# Patient Record
Sex: Female | Born: 1965 | Race: Black or African American | Hispanic: No | Marital: Married | Smoking: Never smoker
Health system: Southern US, Community
[De-identification: ages and names within clinical notes are randomized; demographics above are authoritative.]

## PROBLEM LIST (undated history)

## (undated) DIAGNOSIS — D649 Anemia, unspecified: Secondary | ICD-10-CM

## (undated) DIAGNOSIS — I10 Essential (primary) hypertension: Secondary | ICD-10-CM

## (undated) DIAGNOSIS — I509 Heart failure, unspecified: Secondary | ICD-10-CM

## (undated) DIAGNOSIS — R011 Cardiac murmur, unspecified: Secondary | ICD-10-CM

## (undated) DIAGNOSIS — Z808 Family history of malignant neoplasm of other organs or systems: Secondary | ICD-10-CM

## (undated) DIAGNOSIS — J189 Pneumonia, unspecified organism: Secondary | ICD-10-CM

## (undated) HISTORY — DX: Family history of malignant neoplasm of other organs or systems: Z80.8

## (undated) HISTORY — PX: TUBAL LIGATION: SHX77

---

## 1997-12-01 ENCOUNTER — Encounter: Admission: RE | Admit: 1997-12-01 | Discharge: 1997-12-01 | Payer: Self-pay | Admitting: Family Medicine

## 1998-07-28 ENCOUNTER — Other Ambulatory Visit: Admission: RE | Admit: 1998-07-28 | Discharge: 1998-07-28 | Payer: Self-pay | Admitting: *Deleted

## 2001-02-03 ENCOUNTER — Encounter: Admission: RE | Admit: 2001-02-03 | Discharge: 2001-05-04 | Payer: Self-pay | Admitting: Internal Medicine

## 2003-07-08 ENCOUNTER — Emergency Department (HOSPITAL_COMMUNITY): Admission: EM | Admit: 2003-07-08 | Discharge: 2003-07-08 | Payer: Self-pay | Admitting: Family Medicine

## 2003-08-11 ENCOUNTER — Emergency Department (HOSPITAL_COMMUNITY): Admission: EM | Admit: 2003-08-11 | Discharge: 2003-08-11 | Payer: Self-pay | Admitting: Family Medicine

## 2003-08-18 ENCOUNTER — Other Ambulatory Visit: Admission: RE | Admit: 2003-08-18 | Discharge: 2003-08-18 | Payer: Self-pay | Admitting: Obstetrics and Gynecology

## 2003-08-26 ENCOUNTER — Emergency Department (HOSPITAL_COMMUNITY): Admission: EM | Admit: 2003-08-26 | Discharge: 2003-08-27 | Payer: Self-pay | Admitting: Emergency Medicine

## 2003-09-20 ENCOUNTER — Ambulatory Visit (HOSPITAL_COMMUNITY): Admission: RE | Admit: 2003-09-20 | Discharge: 2003-09-20 | Payer: Self-pay | Admitting: Obstetrics and Gynecology

## 2005-01-22 ENCOUNTER — Other Ambulatory Visit: Admission: RE | Admit: 2005-01-22 | Discharge: 2005-01-22 | Payer: Self-pay | Admitting: Family Medicine

## 2005-04-16 ENCOUNTER — Emergency Department (HOSPITAL_COMMUNITY): Admission: EM | Admit: 2005-04-16 | Discharge: 2005-04-16 | Payer: Self-pay | Admitting: Family Medicine

## 2005-10-03 ENCOUNTER — Encounter: Admission: RE | Admit: 2005-10-03 | Discharge: 2005-10-03 | Payer: Self-pay | Admitting: Family Medicine

## 2005-10-17 ENCOUNTER — Ambulatory Visit: Payer: Self-pay | Admitting: Family Medicine

## 2005-12-18 ENCOUNTER — Ambulatory Visit: Payer: Self-pay | Admitting: Family Medicine

## 2006-04-10 ENCOUNTER — Other Ambulatory Visit: Admission: RE | Admit: 2006-04-10 | Discharge: 2006-04-10 | Payer: Self-pay | Admitting: Obstetrics and Gynecology

## 2006-04-17 ENCOUNTER — Encounter: Admission: RE | Admit: 2006-04-17 | Discharge: 2006-07-16 | Payer: Self-pay | Admitting: Obstetrics and Gynecology

## 2006-04-23 ENCOUNTER — Encounter: Payer: Self-pay | Admitting: Obstetrics and Gynecology

## 2006-04-23 ENCOUNTER — Inpatient Hospital Stay (HOSPITAL_COMMUNITY): Admission: AD | Admit: 2006-04-23 | Discharge: 2006-04-25 | Payer: Self-pay | Admitting: Obstetrics and Gynecology

## 2006-05-20 ENCOUNTER — Inpatient Hospital Stay (HOSPITAL_COMMUNITY): Admission: AD | Admit: 2006-05-20 | Discharge: 2006-05-20 | Payer: Self-pay | Admitting: Obstetrics and Gynecology

## 2006-06-06 ENCOUNTER — Encounter: Payer: Self-pay | Admitting: Obstetrics and Gynecology

## 2006-06-06 ENCOUNTER — Inpatient Hospital Stay (HOSPITAL_COMMUNITY): Admission: AD | Admit: 2006-06-06 | Discharge: 2006-06-06 | Payer: Self-pay | Admitting: Obstetrics and Gynecology

## 2006-06-09 ENCOUNTER — Ambulatory Visit (HOSPITAL_COMMUNITY): Admission: RE | Admit: 2006-06-09 | Discharge: 2006-06-09 | Payer: Self-pay | Admitting: Obstetrics and Gynecology

## 2006-07-25 ENCOUNTER — Ambulatory Visit: Payer: Self-pay | Admitting: Cardiology

## 2006-09-22 ENCOUNTER — Inpatient Hospital Stay (HOSPITAL_COMMUNITY): Admission: AD | Admit: 2006-09-22 | Discharge: 2006-10-15 | Payer: Self-pay | Admitting: Obstetrics and Gynecology

## 2006-09-25 ENCOUNTER — Encounter: Payer: Self-pay | Admitting: Obstetrics and Gynecology

## 2006-09-27 ENCOUNTER — Ambulatory Visit: Payer: Self-pay | Admitting: Vascular Surgery

## 2006-09-28 ENCOUNTER — Encounter (INDEPENDENT_AMBULATORY_CARE_PROVIDER_SITE_OTHER): Payer: Self-pay | Admitting: Obstetrics and Gynecology

## 2006-10-09 ENCOUNTER — Encounter (INDEPENDENT_AMBULATORY_CARE_PROVIDER_SITE_OTHER): Payer: Self-pay | Admitting: Obstetrics and Gynecology

## 2007-07-22 ENCOUNTER — Ambulatory Visit: Payer: Self-pay | Admitting: Internal Medicine

## 2007-07-22 ENCOUNTER — Encounter (INDEPENDENT_AMBULATORY_CARE_PROVIDER_SITE_OTHER): Payer: Self-pay | Admitting: Nurse Practitioner

## 2007-07-22 LAB — CONVERTED CEMR LAB
Albumin: 4 g/dL (ref 3.5–5.2)
CO2: 23 meq/L (ref 19–32)
Chloride: 104 meq/L (ref 96–112)
Cholesterol: 170 mg/dL (ref 0–200)
Glucose, Bld: 202 mg/dL — ABNORMAL HIGH (ref 70–99)
HCT: 36.8 % (ref 36.0–46.0)
Lymphocytes Relative: 39 % (ref 12–46)
Lymphs Abs: 2.7 10*3/uL (ref 0.7–4.0)
Neutrophils Relative %: 54 % (ref 43–77)
Platelets: 314 10*3/uL (ref 150–400)
Potassium: 3.9 meq/L (ref 3.5–5.3)
Sodium: 138 meq/L (ref 135–145)
Total Protein: 7.6 g/dL (ref 6.0–8.3)
Triglycerides: 47 mg/dL (ref <150)
WBC: 6.9 10*3/uL (ref 4.0–10.5)

## 2007-07-23 ENCOUNTER — Ambulatory Visit: Payer: Self-pay | Admitting: *Deleted

## 2007-08-19 ENCOUNTER — Ambulatory Visit: Payer: Self-pay | Admitting: Internal Medicine

## 2007-08-27 ENCOUNTER — Ambulatory Visit (HOSPITAL_COMMUNITY): Admission: RE | Admit: 2007-08-27 | Discharge: 2007-08-27 | Payer: Self-pay | Admitting: Family Medicine

## 2007-10-21 ENCOUNTER — Ambulatory Visit: Payer: Self-pay | Admitting: Internal Medicine

## 2008-05-21 IMAGING — MG MM DIGITAL SCREENING BILAT
4 series · 4 of 4 positions shown · non-contrast
Comparison: none

DG SCREEN MAMMOGRAM BILATERAL
Bilateral CC and MLO view(s) were taken.
Technologist: Abdoolaziz, Dracica.(NAVPREET)([place])

DIGITAL SCREENING MAMMOGRAM WITH CAD:
There are scattered fibroglandular densities.  No masses or malignant type calcifications are 
identified.  Compared with prior studies.

[R CC]
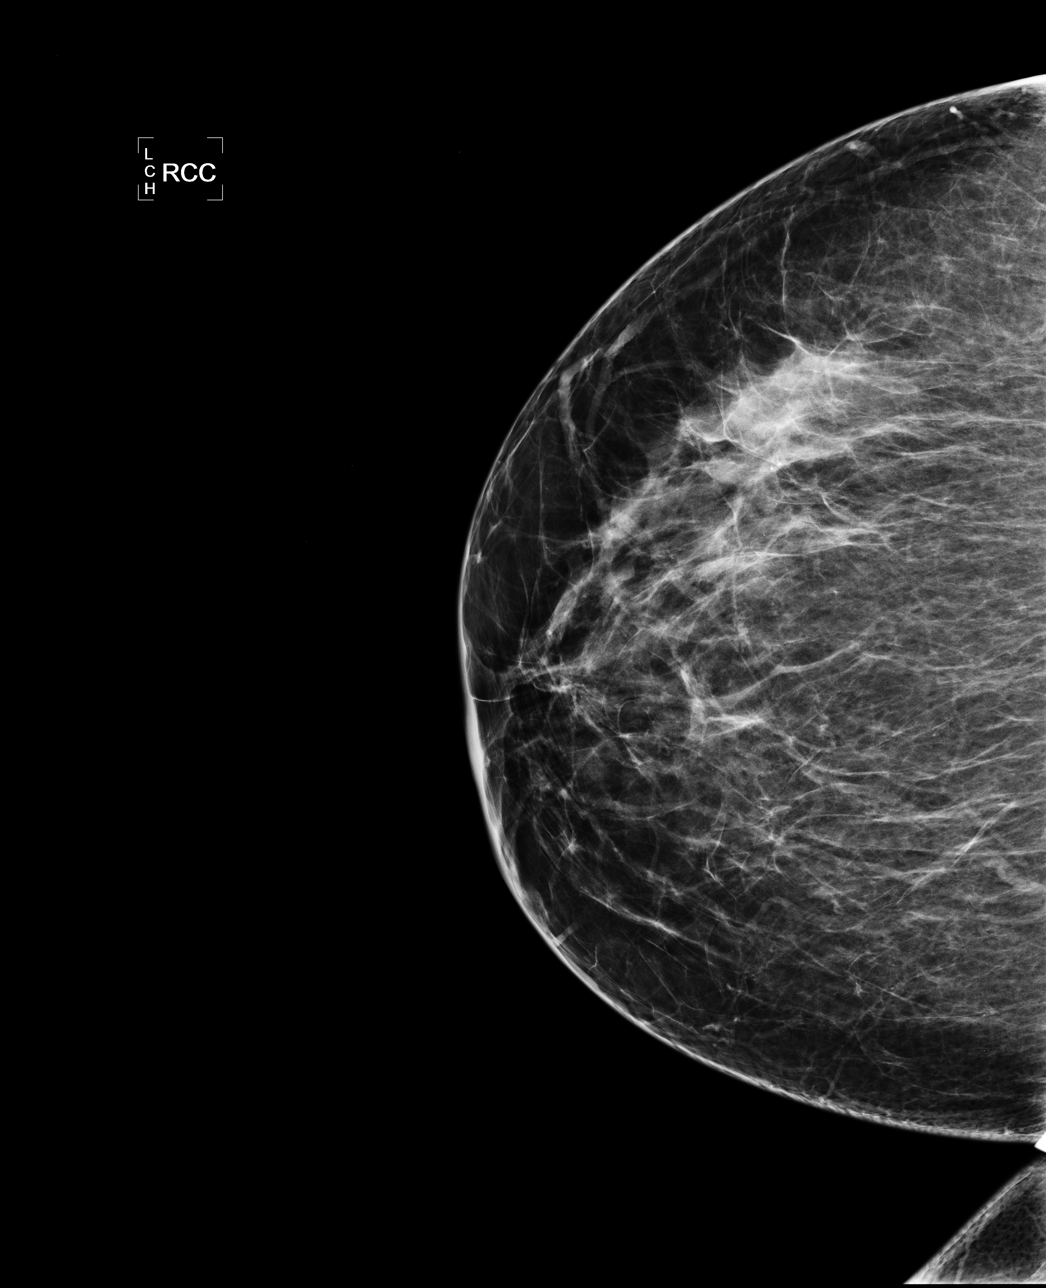

[R MLO]
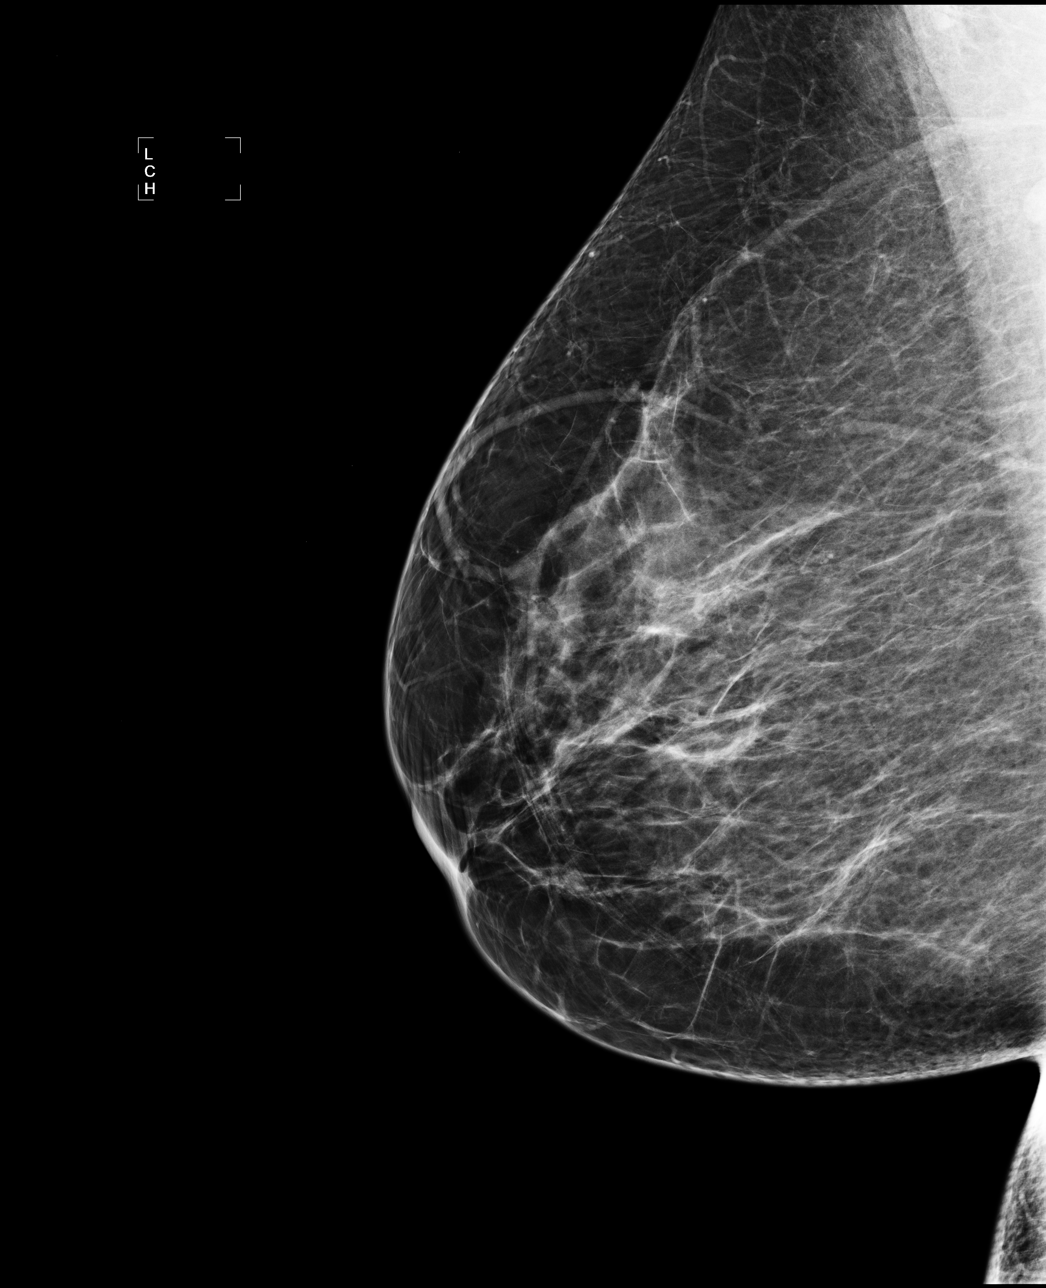

[L CC]
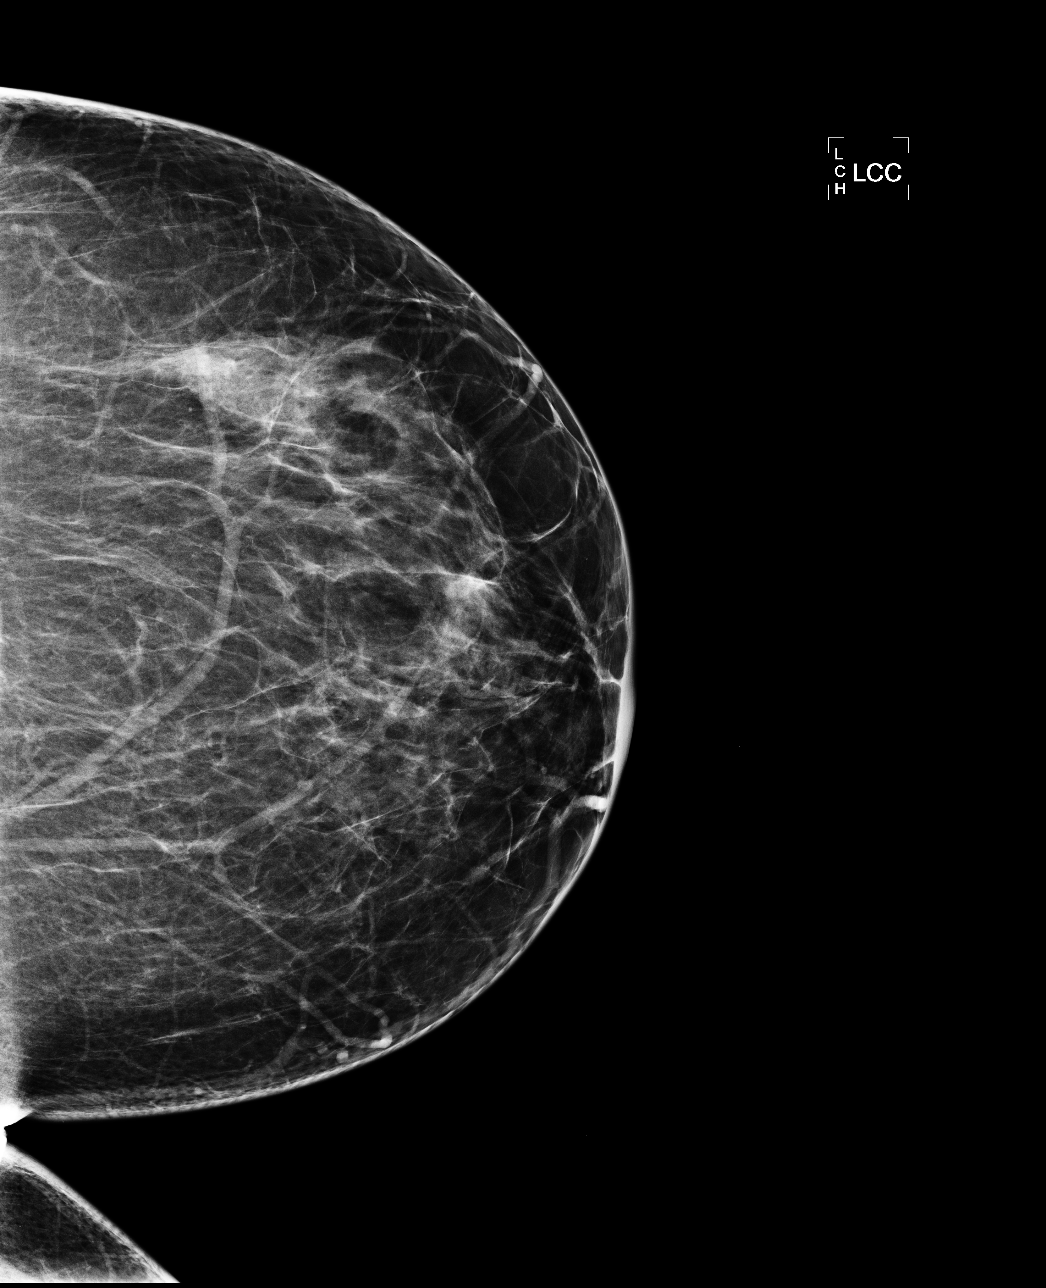

[L MLO]
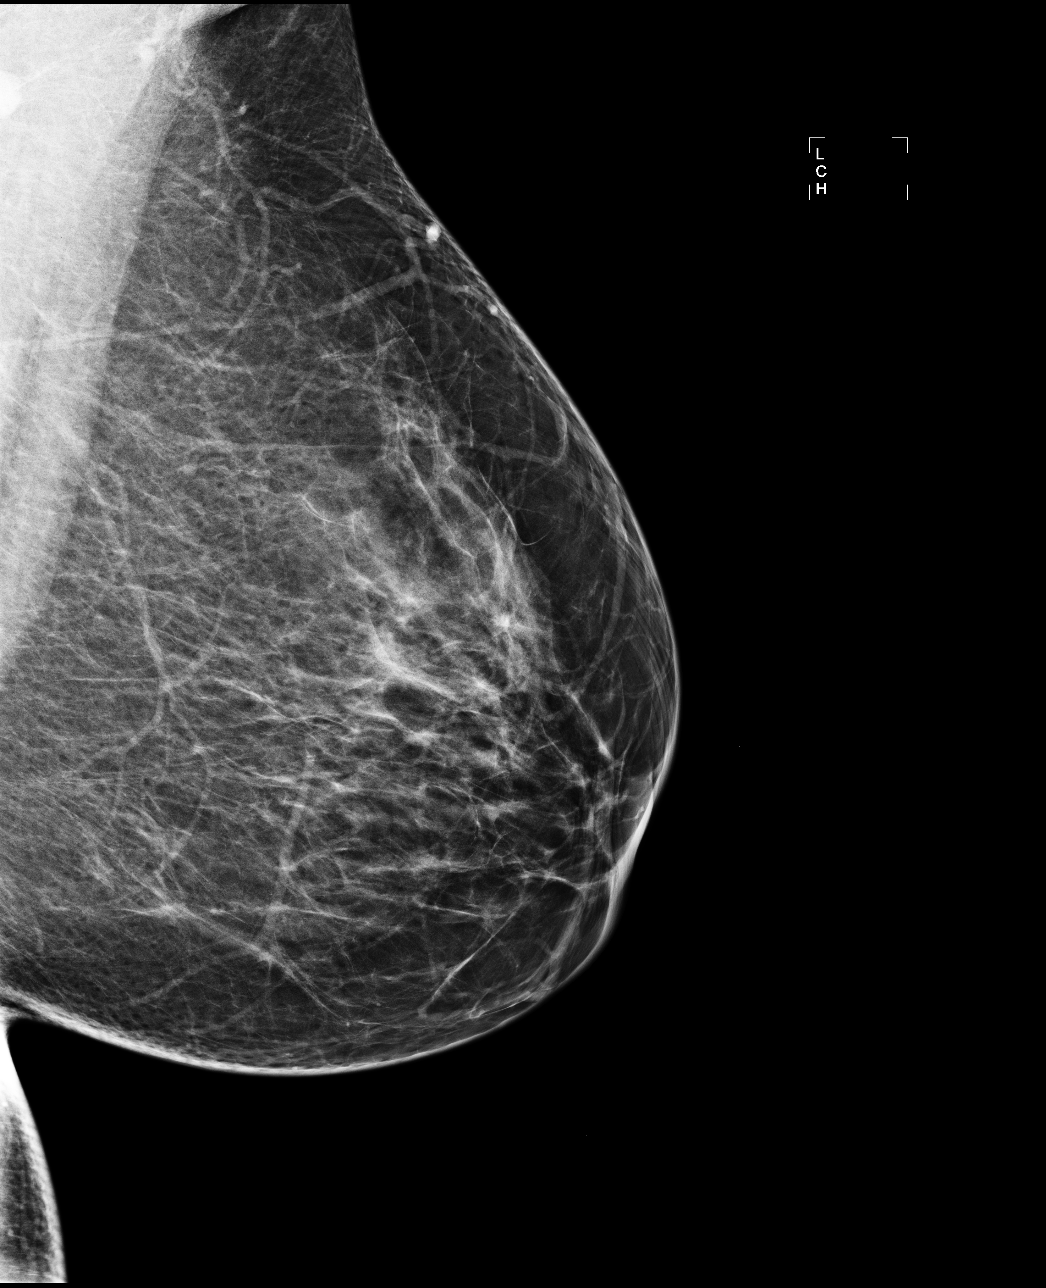

[4 of 4 positions shown; findings below may reference images not displayed]

IMPRESSION: No specific mammographic evidence of malignancy.  Next screening mammogram is recommended in one 
year.

ASSESSMENT: Negative - BI-RADS 1

Screening mammogram in 1 year.
ANALYZED BY COMPUTER AIDED DETECTION. , THIS PROCEDURE WAS A DIGITAL MAMMOGRAM.

## 2008-11-17 ENCOUNTER — Ambulatory Visit: Payer: Self-pay | Admitting: Nurse Practitioner

## 2008-11-17 DIAGNOSIS — E1159 Type 2 diabetes mellitus with other circulatory complications: Secondary | ICD-10-CM | POA: Insufficient documentation

## 2008-11-17 DIAGNOSIS — E1169 Type 2 diabetes mellitus with other specified complication: Secondary | ICD-10-CM | POA: Insufficient documentation

## 2008-11-17 DIAGNOSIS — I10 Essential (primary) hypertension: Secondary | ICD-10-CM | POA: Insufficient documentation

## 2008-11-17 DIAGNOSIS — E119 Type 2 diabetes mellitus without complications: Secondary | ICD-10-CM

## 2008-11-17 LAB — CONVERTED CEMR LAB
Blood Glucose, Fingerstick: 172
Blood in Urine, dipstick: NEGATIVE
Glucose, Urine, Semiquant: NEGATIVE
Hgb A1c MFr Bld: 9.4 %
Ketones, urine, test strip: NEGATIVE
WBC Urine, dipstick: NEGATIVE
pH: 5.5

## 2008-11-18 ENCOUNTER — Encounter (INDEPENDENT_AMBULATORY_CARE_PROVIDER_SITE_OTHER): Payer: Self-pay | Admitting: Nurse Practitioner

## 2008-11-18 DIAGNOSIS — D509 Iron deficiency anemia, unspecified: Secondary | ICD-10-CM | POA: Insufficient documentation

## 2008-11-18 LAB — CONVERTED CEMR LAB
ALT: 12 units/L (ref 0–35)
AST: 19 units/L (ref 0–37)
Albumin: 4.2 g/dL (ref 3.5–5.2)
Alkaline Phosphatase: 131 units/L — ABNORMAL HIGH (ref 39–117)
BUN: 13 mg/dL (ref 6–23)
Calcium: 9.5 mg/dL (ref 8.4–10.5)
Chloride: 103 meq/L (ref 96–112)
Eosinophils Relative: 1 % (ref 0–5)
HCT: 33.3 % — ABNORMAL LOW (ref 36.0–46.0)
Hemoglobin: 9.8 g/dL — ABNORMAL LOW (ref 12.0–15.0)
Lymphocytes Relative: 30 % (ref 12–46)
Lymphs Abs: 2.3 10*3/uL (ref 0.7–4.0)
Neutro Abs: 5 10*3/uL (ref 1.7–7.7)
Platelets: 370 10*3/uL (ref 150–400)
Potassium: 4.3 meq/L (ref 3.5–5.3)
Sodium: 140 meq/L (ref 135–145)
Total Protein: 7.6 g/dL (ref 6.0–8.3)
WBC: 7.9 10*3/uL (ref 4.0–10.5)

## 2009-08-03 ENCOUNTER — Emergency Department (HOSPITAL_COMMUNITY): Admission: EM | Admit: 2009-08-03 | Discharge: 2009-08-03 | Payer: Self-pay | Admitting: Family Medicine

## 2009-11-27 ENCOUNTER — Emergency Department (HOSPITAL_COMMUNITY): Admission: EM | Admit: 2009-11-27 | Discharge: 2009-11-27 | Payer: Self-pay | Admitting: Family Medicine

## 2010-04-02 ENCOUNTER — Encounter (HOSPITAL_COMMUNITY)
Admission: RE | Admit: 2010-04-02 | Discharge: 2010-05-11 | Payer: Self-pay | Source: Home / Self Care | Attending: Internal Medicine | Admitting: Internal Medicine

## 2010-06-03 ENCOUNTER — Encounter: Payer: Self-pay | Admitting: Obstetrics and Gynecology

## 2010-08-05 LAB — POCT URINALYSIS DIP (DEVICE)
Bilirubin Urine: NEGATIVE
Glucose, UA: NEGATIVE mg/dL
Hgb urine dipstick: NEGATIVE
Ketones, ur: NEGATIVE mg/dL
Specific Gravity, Urine: >=1.03 (ref 1.005–1.030)
pH: 5 (ref 5.0–8.0)

## 2010-09-25 NOTE — Op Note (Signed)
[name], [name]                 ACCOUNT NO.:  [id number]   MEDICAL RECORD NO.:  [id number]          PATIENT TYPE:  INP   LOCATION:  NA                            FACILITY:  [facility]   PHYSICIAN:  Janine Limbo, M.D.DATE OF BIRTH:  [birth year]   DATE OF PROCEDURE:  10/09/2006  DATE OF DISCHARGE:                               OPERATIVE REPORT   PREOPERATIVE DIAGNOSIS:  1. 35-week and 4-day gestation.  2. Preeclampsia.  3. Hypertension.  4. Diabetes mellitus.  5. Breech presentation.  6. Obesity (BMI 39.9).  7. Anemia (hemoglobin 8.5).  8. Desires sterilization.  9. Two prior cesarean deliveries.   POSTOPERATIVE DIAGNOSIS:  1. 35-week and 4-day gestation.  2. Preeclampsia.  3. Hypertension.  4. Diabetes mellitus.  5. Breech presentation.  6. Obesity (BMI 39.9).  7. Anemia (hemoglobin 8.5).  8. Desires sterilization.  9. Two prior cesarean deliveries.  10.Dense pelvic adhesions.   PROCEDURE:  1. Repeat low transverse cesarean section.  2. Bilateral tubal ligation.  3. Lysis of adhesions.   OBSTETRICIAN:  Dr. Leonard Schwartz   FIRST ASSISTANT:  Wynelle Bourgeois, certified nurse midwife.   ANESTHETIC:  Is spinal.   DISPOSITION:  Ms. Quashie is a 45 year old female, gravida 6, para 3-0-2-3,  who presents with the above-mentioned diagnosis.  The patient was  admitted to [facility] on Sep 22, 2006.  She has been  treated with large doses of insulin to try to stabilize her blood  sugars.  She currently takes labetalol and Aldomet to maintain her blood  pressure.  A 24-hour urine sample was obtained and the patient was found  to have significant proteinuria which gave her the diagnosis of  preeclampsia.  An amniocentesis was performed today and the LS ratio was  noted to be 2.7:1 with PG.  The patient desires sterilization.  The  patient understands the indications for her surgical procedure and she  accepts the risks of, but not limited to,  anesthetic complications,  bleeding, infection, possible damage to surrounding organs, and possible  tubal failure (24 per 1000).   FINDINGS:  A 6 pound 2 ounce female infant Christean Grief) was delivered from a  complete breech presentation.  The Apgars were 6 at one minute and 9 at  5 minutes.  The arterial cord blood pH was 7.34.  The fallopian tubes  and ovaries appeared normal.  There were dense adhesions present between  the anterior peritoneum and the abdominal musculature.  There were also  dense adhesions between the anterior peritoneum, the bladder, and the  lower to mid uterine segment.  There were dense adhesions between the  right lower uterine segment and the right pelvic sidewall.   PROCEDURE:  The patient was taken to the operating room where a spinal  anesthetic was given.  The patient's abdomen, perineum, and outer vagina  were prepped with multiple layers of Betadine.  A Foley catheter was  placed in the bladder.  The patient was then sterilely draped.  The  lower abdomen was injected with 10 mL of half percent Marcaine with  epinephrine.  A low transverse incision was made in the abdomen and the  incision was extended sharply through the subcutaneous tissue, and then  the fascia.  The abdominal musculature was separated in the midline.  Dense adhesions were noted between the abdominal musculature, the  anterior peritoneum, and the lower to mid uterine segment.  We carefully  entered the anterior peritoneum higher than usual so that we could avoid  damaging the bladder.  Dense adhesions were noted as mentioned above.  We had to perform a lysis of adhesions before we could safely locate the  bladder flap.  The bladder flap was then developed.  The bladder was  pushed cephalad.  An incision was made in the lower uterine segment and  extended in a low transverse fashion.  The amniotic cavity was entered  and the fluid was noted to be clear.  The umbilical cord was noted in  the  lower uterus.  We then delivered the infant by way of a complete  breech extraction.  The mouth and nose were suctioned.  The cord was  clamped and cut and infant was handed to the waiting pediatric team.  Routine cord blood studies were obtained.  The placenta was then removed  from the uterus.  The cavity was cleaned of amniotic fluid, clotted  blood, and membranes.  The uterine incision was closed using a running  locking suture of 2-0 Vicryl followed by an imbricating suture of 2-0  Vicryl.  Hemostasis was achieved using figure-of-eight sutures of 2-0  Vicryl.  The left fallopian tube was identified and followed to its  fimbriated end.  A knuckle of tube was made on the left using a free tie  and then a ligature of 0 plain catgut.  The knuckle of tube thus made  was excised.  Hemostasis was adequate.  We were unable to identify the  right fallopian tube because of dense adhesions.  Lysis of adhesions was  then accomplished so that we could isolate the right fallopian tube.  The right tube was followed to its fimbriated end.  A knuckle of tube  was made on the left using a free tie and then a ligature of 0 plain  catgut.  The knuckle of tube thus made was excised.  Once again  hemostasis was adequate.  The pelvis was vigorously irrigated.  The  anterior peritoneum and the abdominal musculature were reapproximated in  the midline using 2-0 Vicryl.  The fascia was irrigated.  The fascia was  closed using a running suture of 0 Vicryl followed by three interrupted  sutures of 0 Vicryl.  A Jackson-Pratt drain was placed in the  subcutaneous space and brought out through the left lower quadrant which  was sutured into place using 3-0 silk.  The subcutaneous layer was  closed using a running suture of 2-0 Vicryl.  The skin was  reapproximated using a subcuticular suture of 3-0 Monocryl.  Sponge,  needle, instrument counts were correct.  The estimated blood loss for the procedure was 800 mL.   The patient tolerated her procedure well.  The estimated start time for the procedure was 10:10 p.m.  The estimated  ending time for the procedure was 11:15.  The patient was noted to have  milk colored urine and she was taken to the recovery room.  This is  because we did fill the bladder with sterile milk during the operative  procedure and there was no evidence of leakage of milk through the  bladder  mucosa.  This reassured [place] that the bladder had not been  damaged.     Janine Limbo, M.D.  Electronically Signed    AVS/MEDQ  D:  10/09/2006  T:  10/10/2006  Job:  [id number]

## 2010-09-25 NOTE — H&P (Signed)
NAMEKRISSIA, [name]                 ACCOUNT NO.:  [id number]   MEDICAL RECORD NO.:  [id number]          PATIENT TYPE:  INP   LOCATION:  9159                          FACILITY:  [facility]   PHYSICIAN:  Janine Limbo, M.D.DATE OF BIRTH:  05-May-1966   DATE OF ADMISSION:  09/22/2006  DATE OF DISCHARGE:                              HISTORY & PHYSICAL   [name] is a 45 year old gravida 6, para 3-0-2-3 at 33 weeks who  presents from the office today with blood pressures of 190/120 and  188/118 there.  She was scheduled for NST but the NST was not completed  due to the patient's elevated blood pressure.  The patient has been on  Aldomet 250 mg p.o. t.i.d., labetalol 300 mg p.o. t.i.d., but she has  been inconsistent with taking her medications.  She has also had poor  compliance with blood sugar monitoring and has refused insulin.  In  light of her adult-onset diabetes x12 years, she has been on glyburide  2.5 mg p.o. b.i.d.  Again, her utilization of this has been  intermittent.  In light of her elevated blood pressure and poor  compliance with blood pressure and blood sugar monitoring and medication  regimen, she is to be admitted for antenatal observation and assessment.   Pregnancy has been remarkable for:  1. Chronic hypertension.  2. Adult-onset diabetes with the patient on glyburide since she      refuses insulin.  3. History of preeclampsia.  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  5. History of fibroids.  6. Previous cesarean section x2 with a subsequent VBAC with a repeat C-      section with tubal ligation scheduled on October 27, 2006.  7. First trimester spotting.  8. Advanced maternal age with normal amniocentesis.   PRENATAL LABORATORY DATA:  Blood type is B positive, Rh antibody  negative.  VDRL nonreactive.  Rubella titer positive.  Hepatitis B  surface antigen negative.  HIV is nonreactive.  Sickle cell test was  negative.  GC and chlamydia cultures were  negative in November.  Pap was  normal in November.  Hemoglobin upon entering the practice was 10.5; it  was still slightly low at 26 weeks.  Fasting blood sugars on initial  visit at 17 weeks were 130s.  Two-hours p.c.'s were 120 or less.  She  had an amniocentesis that showed normal XY.  Her blood sugars throughout  the rest of her pregnancy per the patient were reported as normal.  However, when they were checked in the office they tended to be  elevated.  Blood pressures also remained elevated and medication was  adjusted throughout her pregnancy.  She had a hemoglobin A1c on April 16  that was 6.6.  EDC of November 09, 2006, was established by ultrasound at 18  weeks.   HISTORY OF PRESENT PREGNANCY:  The patient entered care at approximately  17 weeks 3 days.  She was on Aldomet prior to pregnancy at 500 mg  b.i.d., labetalol 400 mg t.i.d., and glyburide 2.5 mg b.i.d.  Her  Aldomet was increased to t.i.d. at her first visit.  Fasting blood  sugars were in the 130s.  Two-hour p.c.'s were 120 or less per patient  report.  She had an amniocentesis on January 28 that was normal.  The  patient reported that she got sick when she was taking her hypertension  medications but blood pressure remained 150/94.  She had an ultrasound  at 19 weeks showing normal growth and development with normal fluid.  The patient's amniocentesis showed a normal 46,XY.  The patient's  Aldomet was 500 mg q.i.d. and labetalol 400 mg t.i.d. at 21 weeks.  The  patient consistently did not bring her blood sugar meter or her booklet  for evaluation at her visits.  At 11 weeks she had been admitted to the  hospital for poor control of blood sugar and hypertension.  TSH was done  at that time.  Sliding scale insulin was also initiated.  She had a 24-  hour urine at that time of 170 mg of protein.  In March she had a  cardiology office visit secondary to dyspnea, murmur and hypertension.  Overall assessment showed normal  sinus rhythm, normal axis.  Echocardiogram was done that was normal.  She was placed on hydralazine  at that time.  She was continued to be monitored through the rest of her  pregnancy by our physicians.  The patient's blood pressures continued to  run in the 120s to 170s to 180s over 86 to 108.  She was recommended to  go to maternity admissions unit at 26 weeks for evaluation secondary to  blood pressures of 180/108 and 170/100.  She was told to take her  Aldomet q.i.d. as she had previously been ordered.  She continued to  refuse insulin.  An ultrasound at 29 weeks showing normal growth with  fluid normal as well.  Growth was at the 75th percentile.  She had  previously been undecided about repeat cesarean section.  However, by 29  weeks she had decided to repeat her C-section and have a tubal as well.  This was scheduled for October 27, 2006, at  9 a.m.  She had another  ultrasound at 32 weeks on May 9 for decreased fetal movement.  BPP was  8/8.  Fluid was 13.5.  Cervix was 3.34 cm long.  Hemoglobin A1c was 6.6.  The patient continued to refuse insulin, even though her fasting blood  sugars ranged from 90-104.  The patient was continued on labetalol 600  mg t.i.d. and Aldomet 500 mg t.i.d.  She was started on NSTs today.   OBSTETRICAL HISTORY:  In 1991 she had a termination of pregnancy in the  first trimester.  In 1992 she had a primary low transverse cesarean  section for a female infant, weight 9 pounds 3 ounces at [redacted] weeks  gestation.  She was in labor 12 hours.  She had rupture of membranes  greater than 24 hours.  She had preeclampsia and was on magnesium  sulfate at that time.  She did have shortness of breath with the  magnesium sulfate.  She also in 1995 had a repeat low transverse  cesarean section for a female infant, weight 8 pounds 12 ounces at 38  weeks.  That was a scheduled C-section with a spinal anesthesia.  She has gestational diabetes and pregnancy-induced hypertension  with that  pregnancy.  In 1997 she had a termination of pregnancy at less than 12  weeks.  In 1998 she had  a vaginal birth VBAC of a female infant, weight  9 pounds 2 ounces at 38 weeks.  She was in labor 16 hours.  She had  epidural anesthesia.  She was induced for gestational diabetes and  pregnancy-induced hypertension.  In her 19 and 1998 pregnancy she had  gestational diabetes.  In her 62 and 61 and 1998 pregnancy she had  pregnancy-induced hypertension, preeclampsia with the first one.  She  had premature rupture of membranes in 1992 at term.   MEDICAL HISTORY:  She reports the usual childhood illnesses.  She was  diagnosed with HSV in 2008.  She reports no recent or current lesions.  She was diagnosed with hypertension in 1998 and has been on medication  since that time.  She was diagnosed with adult-onset diabetes in 1996  and has been on oral medications since 1999.  She has had one UTI.  She  fractured her toe in 1994.   SURGICAL HISTORY:  She has a C-section x2.  Her only other  hospitalization was for childbirth.  She also was in the hospital in  1995.  She had a virus when she was pregnant.  She also had the two  previously noted TABs.   GENETIC HISTORY:  Remarkable for the patient's age of 16 with a normal  46,XY infant.  Father of the baby had an uncle who had trait and a  cousin who had sickle cell disease.  Father-of-the-baby's cousin is  autistic.   FAMILY HISTORY:  The patient's mother, brother, maternal aunts and  maternal uncles have hypertension.  Her maternal grandmother is well.  Maternal grandmother had varicosities.  Maternal grandmother had stroke  and Parkinson's disease.  Father had lung cancer.  Maternal uncle  schizophrenic possibility.   The patient is sensitive to MAGNESIUM SULFATE which causes shortness of  breath.   SOCIAL HISTORY:  The patient is married to the father of the baby.  He  is involved and supportive.  His name is Gitel Beste.  The patient is  African-American.  She is college educated.  She is employed with social  service.  Her husband has two years of college.  He is employed in  temporary work.  She has been followed by the physician service at  [facility].  She denies any alcohol, drug or tobacco use during  this pregnancy.   PHYSICAL EXAMINATION:  VITAL SIGNS:  The patient's initial blood  pressure was 153/100, then 139/88.  Other vital signs are stable.  HEENT:  Within normal limits  LUNGS:  Bilateral breath sounds are clear.  HEART:  Regular rate and rhythm without murmur.  BREASTS:  Soft and nontender.  ABDOMEN:  Fundal height is approximately 33-34 cm.  Abdomen is soft and  nontender and gravid.  There is positive nasal congestion noted.  Fetal  heart rate is reactive.  There are no uterine contractions noted.  EXTREMITIES:  Deep tendon reflexes are 2+ without clonus.  There is 2+ edema noted in the lower extremities.  Weight today is pending.  Previous weight on May 9 at the office was 220.5.  PELVIC:  Deferred but there is no presence or history of HSV lesion or  prodrome any time recently.   IMPRESSION:  1. Intrauterine pregnancy at 33 weeks.  2. Chronic hypertension.  3. Adult-onset diabetes with poor control noted of both of these.  4. Previous cesarean section with plan for a repeat and tubal      sterilization.  PLAN:  1. Admit to antenatal unit per consult with Dr. Stefano Gaul as attending      physician.  2. Continuous electronic fetal monitoring.  3. PIH labs with urinalysis.  4. Claritin and Tussionex for cough and congestion.  5. CBGs with a fasting blood sugar and 2-hour p.c.'s.  6. Implement medication regimen as ordered with Dr. Stefano Gaul planning      for labetalol 300 mg one p.o. t.i.d., glyburide 2.5 mg one p.o.      b.i.d., and Aldomet 250 mg one p.o. t.i.d.  7. M.D.'s will follow.  8. Carbohydrate-restricted diet.      Renaldo Reel Emilee Hero, C.N.M.       Janine Limbo, M.D.  Electronically Signed    VLL/MEDQ  D:  09/22/2006  T:  09/22/2006  Job:  [id number]

## 2010-09-25 NOTE — Discharge Summary (Signed)
[name], [name]                 ACCOUNT NO.:  [id number]   MEDICAL RECORD NO.:  [id number]          PATIENT TYPE:  INP   LOCATION:  9371                          FACILITY:  [facility]   PHYSICIAN:  Osborn Coho, M.D.   DATE OF BIRTH:  1965/06/19   DATE OF ADMISSION:  09/22/2006  DATE OF DISCHARGE:  10/12/2006                               DISCHARGE SUMMARY   ADMISSION DIAGNOSES:  1. Intrauterine pregnancy at 33 weeks.  2. Chronic hypertension.  3. Adult onset diabetes with poor control.  4. Previous Cesarean section with plan for repeat and tubal      sterilization.   DISCHARGE DIAGNOSES:  1. 35-4/7 weeks.  2. Chronic hypertension with superimposed pre-eclampsia.  3. Type 1 diabetes.  4. Breech presentation.  5. Obesity.  6. Anemia.  7. Pneumonia.  8. Desires sterilization.  9. Two previous Cesarean sections.   PROCEDURES:  1. Repeat low transverse Cesarean section.  2. Spinal anesthesia.  3. Tubal sterilization.  4. Lysis of adhesions.  5. Amniocentesis.   HOSPITAL COURSE:  Ms. Fulwider is a 45 year old, gravida 6, para 3-0-2-3, at  69 weeks who presented from an office evaluation of Sep 22, 2006, with  blood pressures of 190/120 and 188/118.  The patient had been on Aldomet  and labetalol at home but had been inconsistent with taking her  medications.  She was also an adult onset diabetic with the patient  being on glyburide since intrauterine pregnancy and refusing insulin.  She also has poor compliance with blood sugar control.  The patient was  admitted that day for blood pressure management, diabetes management and  monitoring.  Her pregnancy had been remarkable for:  1. Chronic hypertension.  2. Adult onset diabetes with patient on glyburide since refusing      insulin.  3. History of pre-eclampsia with previous pregnancy.  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  5. History of fibroids.  6. Previous Cesarean sections x2 with subsequent V-back with  plan for      repeat C-section and tubal ligation that was scheduled for October 27, 2006.  7. First trimester spotting  8. Advanced maternal age with normal amniocentesis.   On admission, blood pressure was 153/100.  Fetal heart rate was  reactive.  Blood sugar on admission was 152 with hemoglobin 9.3, white  blood cell count 8.9 and platelet count 341,000.  She had 100 mg of  protein on a clean-catch specimen.  Her liver function tests were within  normal limits as well as comprehensive metabolic panel.  A 24-hour urine  was initiated on the day of admission.  Aldomet doses and labetalol  doses were adjusted to match blood pressure issues.  Blood sugars  remained outside normal limits.  Fetal heart rate remained reactive.  Maternal fetal medicine consult was obtained.  The patient continued to  refuse insulin.  Glyburide doses were adjusted in an attempt to keep  blood sugar stable.  However, she began to have some hypoglycemic  episodes.  By Sep 26, 2006, she was  beginning to feel slightly congested  and was placed on nebulizer treatments and albuterol inhaler.  No  evidence of DVT was noted.  She had Doppler flow studies on Sep 27, 2006, that were normal.  For the next several days, blood pressures were  still labile as were blood sugars.  The patient continued to refuse  insulin.  By Sep 29, 2006, she began to have worsening of her cough and  began to be productive by Sep 29, 2006.  She was placed on a Z-Pak.  She  had a chest x-ray.  PIH labs were repeated.  She was placed on Mucinex,  Tylenol and incentive spirometry.  She did receive a diagnosis of  pneumonia in the right lower lobe.  She was placed on ceftriaxone and  Rocephin.  Her 24-hour urine on Sep 30, 2006, showed 376 mg protein.  The patient refused magnesium since she had pulmonary edema on previous  administration of mag in her last pregnancy.  Medicine consult was  obtained.  Over the next several days, the patient  began to improve in  terms of her pneumonia.  By Oct 02, 2006, blood pressures were 149/90.  She had been diagnosed officially with mild pre-eclampsia.  Blood sugars  began to be slightly improved.  The patient was presented with the  option of amnio between 36 and 37 weeks and then delivery if mature.  The patient did wish to have a V-back if possible.  An ultrasound was  performed showing a breech presentation with an AFI of 9.18 with a 14th  percentile, BPP 8 out of 8, and growth at the 58th percentile with  estimated fetal weight of 5 pounds 5 ounces.  Blood sugars were still  unstable.  The patient was begun on insulin on Oct 04, 2006.  Blood  sugars began to improve somewhat.  On Oct 05, 2004, she did have slight  elevations of her SGOT and SGPT.  These did stabilize.  Amnio was  attempted on Oct 08, 2006.  Fluid was only 8.2 AFI.  Presentation was  breech.  There was no adequate pocket for amnio to be performed.  Decision was made to try to give IV fluids and attempt the next day.  Dr. Stefano Gaul was able to perform the amnio on Oct 09, 2006.  The LS  ratio was 2.72:1 with positive PG.  The decision was made later on the  evening of Oct 09, 2006, to proceed with C-section and tubal  sterilization.  The patient was taken to the operating room where a  repeat low transverse Cesarean section was performed by Dr. Stefano Gaul  under spinal anesthesia.  There were significant adhesions noted.  The  patient did tolerate the procedure well.  She had a 6-pounds 2-ounce  female with APGAR 6 and 9.  Cord pH was normal.  There were significant  adhesions noted.  The patient continued to decline magnesium sulfate  secondary to her history of previous pulmonary edema while on it.  She  was taken to [facility] for further care.  Infant was taken to the full-term  nursery.   By postop day #1, the patient was doing well.  She was feeling much better.  Her chest was much more comfortable.  Blood pressure was   139/77.  Urine output was slightly diminished.  The patient was given  some IV fluids and low dose of hydrochlorothiazide.  Her SGOT was 40  with SGPT 38.  Hemoglobin was 8.1 with  hematocrit 24.1, white blood cell  count 11.6 and platelets 399,000.  Weight was 239.5 pounds.   Over the next several days, the patient continued to improve.  Next labs  were done on Oct 11, 2006, with hemoglobin 7, hematocrit 22, white blood  cell count 11 and platelet count 359,000.  SGOT was 32 with SGPT 34.  Fasting blood sugar was 119.  A two-hour postprandial was 93.  On Oct 11, 2006, insulin was discontinued, and the patient wanted to restart  glyburide.  Lasix was given on that day with significant improvement of  her urine output.   By postop day three, the patient was doing well.  She was up ad lib.  She was having no syncope.  No shortness of breath, chest pain,  dizziness or any other signs or symptoms or any issue.  Blood pressure  was 163/90.  Other vital signs were stable.  Orthostatics were also  stable.  Fasting blood sugar was 74.  Two-hour PC was 78 from the night  before.  Her incision was clean, dry and intact with Steri-Strips and  subcuticular sutures.  JP drain was removed without difficulty.  Deep  tendon reflexes were 2+.  There was 2 to 3+ edema noted.  Weight was  250.  CBC was done, although it had not been ordered by Dr. Su Hilt.  Hemoglobin 6.7, white blood cell count 7.3 and platelet count 376,000  was noted.  Orthostatics again were done and were stable.  Dr. Su Hilt  saw the patient that day and elected that in light of the patient's  significant improvement to allow the patient to go home.  The patient  was deemed to receive full benefit of hospital stay and was discharged  home that day.   DISCHARGE INSTRUCTIONS:  Prior to discharge, the patient was given one  dose of Lasix 20 mg p.o.  Discharge instructions per [facility]  handout.  PIH precautions were also  reviewed with the patient as well as  diabetes management.   DISCHARGE MEDICATIONS:  1. Aldomet 500 mg p.o. t.i.d.  2. Labetalol 600 mg 1 p.o. t.i.d.  3. HCTZ 25 mg 1 p.o. daily.  4. Repliva 1 p.o. daily.  5. Percocet 5/325 1-2 p.o. q.3-4 h. p.r.n. pain.  6. Motrin 600 mg p.o. q.6 h. p.r.n. pain.  7. Glyburide 5 mg p.o. daily.  8. Colace 1 p.o. daily.   FOLLOWUP:  Discharge followup will occur in one week at [facility] for blood pressure check.      Renaldo Reel Emilee Hero, C.N.M.      Osborn Coho, M.D.  Electronically Signed    VLL/MEDQ  D:  10/12/2006  T:  10/12/2006  Job:  [id number]

## 2010-09-25 NOTE — Op Note (Signed)
[name], RAMBO                 ACCOUNT NO.:  [id number]   MEDICAL RECORD NO.:  [id number]          PATIENT TYPE:  INP   LOCATION:  NA                            FACILITY:  [facility]   PHYSICIAN:  Janine Limbo, M.D.DATE OF BIRTH:  [birth year]   DATE OF PROCEDURE:  10/09/2006  DATE OF DISCHARGE:                               OPERATIVE REPORT   PREOPERATIVE DIAGNOSIS:  1. 35-week and 4-day gestation.  2. Mild preeclampsia.  3. Diabetes mellitus.  4. Decreased amniotic fluid.   POSTOPERATIVE DIAGNOSIS:  1. 35-week and 4-day gestation.  2. Mild preeclampsia.  3. Diabetes mellitus.  4. Decreased amniotic fluid.   PROCEDURE:  Amniocentesis for maturity studies.   OBSTETRICIAN:  Dr. Leonard Schwartz.   ANESTHETIC:  Local Xylocaine.   DISPOSITION:  The patient is a 45 year old female, gravida 6, para 3-0-3-  3, who has been hospitalized since Sep 22, 2006.  She has diabetes and  is currently on insulin.  She has hypertension and is on Aldomet and  labetalol.  The patient has proteinuria and is now said to be pre-  eclamptic.  Her blood pressure this morning was 151/106.  The patient's  infant is currently in a breech presentation.  She has had a prior  cesarean section.  She desires sterilization.  We will attempt an  amniocentesis because if we can document that her infant is mature we  can proceed with delivery.  The patient understands the indications for  her procedure and she accepts the associated risks which included a risk  of bleeding, infection, rupture of membranes, preterm labor, and fetal  stress which may lead to the emergency cesarean section.   FINDINGS:  A single intrauterine gestation was noted in a breech  presentation.  The amniotic fluid volume was decreased.  The patient's  blood type is known to be B+.   PROCEDURE:  The patient was taken to the ultrasound suite.  An  ultrasound was performed with findings as mentioned above.  The patient  was  noted to have a fluid cavity in the right mid quadrant without  umbilical cord present.  We reviewed the risks and benefits associated  with her procedure.  A permit was previously signed.  The patient's  abdomen was prepped with multiple layers of Betadine and then sterilely  draped.  4 mL of 1% Xylocaine were injected into the skin.  The spinal  needle was inserted into the amniotic cavity under ultrasound guidance.  15 mL of fluid were removed.  Initially the fluid was slightly bloody  but then became clear.  The spinal needle was removed and an ultrasound  was repeated.  The fetal heart motions were noted to be stable.  There  was no evidence of damage.  The patient tolerated the procedure well.   FOLLOW-UP INSTRUCTIONS:  The patient was sent back to the antenatal unit  at the [facility] for monitoring.  The amniotic fluid  was sent to [facility] for a LS ratio and PG  determination.      Merton Border  Zack Seal, M.D.  Electronically Signed     AVS/MEDQ  D:  10/09/2006  T:  10/09/2006  Job:  [id number]

## 2010-09-28 NOTE — Discharge Summary (Signed)
NAME:  [name], [name]                 ACCOUNT NO.:  [id number]   MEDICAL RECORD NO.:  [id number]          PATIENT TYPE:  INP   LOCATION:  9313                          FACILITY:  [facility]   PHYSICIAN:  Naima A. Dillard, M.D. DATE OF BIRTH:  1966-05-13   DATE OF ADMISSION:  04/23/2006  DATE OF DISCHARGE:  04/25/2006                               DISCHARGE SUMMARY   ADMISSION DIAGNOSES:  1. Intrauterine pregnancy at 11 weeks and two days.  2. Type 2 diabetes.  3. Chronic hypertension with poorly controlled sugars and blood      pressures.   DISCHARGE DIAGNOSES:  1. Intrauterine pregnancy at 11 weeks and five days.  2. Type 2 diabetes.  3. Chronic hypertension.   PROCEDURE:  None.   HISTORY OF PRESENT ILLNESS:  [name] is a 45 year old, gravida 4 para 3-  0-0-3 at 11-3/7 weeks who was admitted on April 23, 2006 from the  office for poorly controlled blood sugars and blood pressures.  Her  hemoglobin A1C on April 10, 2006 was 8. Her fasting blood sugars had  been around 120 and her two hour postprandial blood sugars had been in  the 110s to 140s. She had been noncompliant with medications. She also  had not been consistent with taking her blood pressure medications at  home. She had been on Aldomet 250 mg three times per day, labetalol 400  mg p.o. three times per day and Fortanet 500 mg one p.o. daily.  On  admission, her blood pressure was 210/106 and 162/101.  She had an  ultrasound which confirmed 11 week 3 days dating.   PAST MEDICAL HISTORY:  1. Remarkable for type 2 diabetes for 12 years on oral      antihyperglycemic agent.  2. Chronic hypertension with patient on Aldomet and labetalol at home      since pregnancy began.  3. Previous cesarean section x2 with subsequent V-back x1.   HOSPITAL COURSE:  On admission to the hospital, her labetalol was  increased to 600 mg t.i.d. and her Aldomet was increased to 500 mg  t.i.d.  Labetalol IV was given p.r.n.  A 24 hour urine  was begun for  protein and creatinine clearance. Maternal fetal medicine was consulted  and followed the patient.  She declined insulin initially.  She wished  to remain on the Metformin.  TSH was also examined.  Metformin 500 mg  b.i.d. was begun with a sliding scale insulin.  Subsequently to her  admission, in the evening, the patient was having blood pressures of  190/109 and 161/99.  She was given a total of three doses of labetalol.  She was given hydralazine 10 mg IV with a blood pressure of 153/92 20  minutes subsequent to that. Nutritional consult was obtained. Maternal  fetal medicine consult was obtained.  They recommended continuing the  labetalol and Aldomet. Dr. Pennie Rushing saw the patient on hospital day two,  April 24, 2006, CBGs were still abnormal.  Blood pressure was  improving somewhat.  Insulin 10 units of regular was given in the  morning of April 24, 2006.  Glyburide 2.5 mg x1 and then 5 mg h.s.  was given. Glyburide 2.5 mg q.a.m. and Glyburide 5 mg q.h.s. was  prescribed.  Insulin sliding scale was initiated only for CBGs greater  than 200.  Blood pressures later in the afternoon were 150s/90s to 100.  The patient did have one hypoglycemic episode on the afternoon of  April 24, 2006.  By the morning of April 25, 2006 the patient's  blood pressure was in the 140s-160s/90-102.  Fasting blood sugar was  104, two hour postprandial was 69 to 184.  Metabolic panel was normal.  TSH was 0.473.  A 24 hour urine showed 170 mg of protein in a 24 hour  specimen and creatinine clearance of 213.  Dr. Normand Sloop evaluated the  patient that day and a plan was made to discharge her home.  Diabetic  teaching was implemented. Maternal fetal medicine saw the patient again  that day and continued to recommend Glyburide 2.5 mg daily with  possibility of increasing this to b.i.d. with a maximum dose of 20 mg  per day.  The patient was discharged to home in stable condition.    DISCHARGE INSTRUCTIONS:  The patient will continue to monitor her blood  sugars.  She will continue to monitor her diet and will have a  restricted carbohydrate diet.   DISCHARGE MEDICATIONS:  1. Labetalol 600 mg t.i.d.  2. Aldomet 500 mg t.i.d.  3. Glyburide 2.5 mg daily.  4. Prenatal vitamin 1 p.o. daily will also be continued.   The patient will monitor for any signs or symptoms of hyperglycemia or  hypoglycemia and will notify us should she have any issue with blood  sugar greater than 200. Discharge followup will occur in one week at  [facility].      [name], C.N.M.      Naima A. Normand Sloop, M.D.  Electronically Signed    VLL/MEDQ  D:  04/28/2006  T:  04/28/2006  Job:  [id number]

## 2010-09-28 NOTE — Assessment & Plan Note (Signed)
[facility]                            CARDIOLOGY OFFICE NOTE   NAME:[name], [name] CURIALE                        MRN:          [id number]  DATE:07/25/2006                            DOB:          1965-05-26    The patient is a 45 year old female with past medical history of  hypertension and diabetes mellitus who is now [redacted] weeks pregnant who we  were asked to evaluate for dyspnea, murmur, and hypertension.  The  patient has no prior cardiac history.  She did state that she has had  hypertension for 15 years.  This has been difficult to control even when  she has not been pregnant.  She has had five previous pregnancies and  done well with each other than hypertension and diabetes mellitus.  She  did state that after one pregnancy she developed fluid in her lungs  which was felt to be an allergic reaction.  However, over the past four  weeks she has noticed worsening dyspnea on exertion, orthopnea, PND and  minimal pedal edema.  She has not had chest pain, palpitations, or  syncope.  Because of her dyspnea and elevated blood pressure, we were  asked to further evaluate.   MEDICATIONS:  1. Labetalol 200 mg tablets two p.o. b.i.d.  2. Methyldopa 250 mg tablets two p.o. t.i.d.  3. Glyburide 2.5 mg p.o. b.i.d.  4. Prenatal vitamins.   ALLERGIES:  No known drug allergies.   SOCIAL HISTORY:  She does not smoke and does not consume alcohol at  present.   FAMILY HISTORY:  Negative for coronary artery disease.   PAST MEDICAL HISTORY:  Significant for diabetes mellitus and  hypertension but there is no hyperlipidemia.  There are no other medical  problems noted.  She has had previous cesarean section.   REVIEW OF SYSTEMS:  She denies any headaches, fevers, or chills.  There  is no productive cough or hemoptysis.  There is no dysphagia,  odynophagia, melena, or hematochezia.  There is no dysuria, hematuria.  No rash or seizure activity.  There is no  orthopnea, PND, pedal edema.  The remaining systems are negative.   PHYSICAL EXAMINATION:  VITAL SIGNS:  Blood pressure 185/110.  Pulse 100.  Weights 220 pounds.  GENERAL:  She is well-developed and appears to be pregnant.  She is in  no acute distress.  Her skin is warm and dry.  She does not appear to be  depressed. There is no peripheral clubbing.  BACK:  Normal.  HEENT:  Unremarkable with normal eyelids.  NECK:  Supple with a normal upstroke bilaterally.  I cannot appreciate  bruits.  There is no jugular venous distention and there is no  thyromegaly noted.  CHEST:  Clear to auscultation, normal expansion.  CARDIOVASCULAR:  Regular rate and rhythm with a normal S1, S2.  There is  a 2/6 systolic ejection murmur at the left sternal border.  I cannot  appreciate an S3 or S4.  Her PMI is nondisplaced.  ABDOMEN:  Significant for a 25-week intrauterine pregnancy.  There is no  tenderness to palpation.  I cannot appreciate hepatomegaly.  She has 2+  femoral pulses bilaterally, no bruits.  EXTREMITIES:  Trace edema and I can palpate no cords.  She has 2+  dorsalis pedis pulses bilaterally.  NEUROLOGIC:  Grossly intact.   Her electrocardiogram today shows a sinus rhythm at a rate of 100.  The  axis is normal.  There are no significant ST changes noted.   DIAGNOSES:  1. Dyspnea on exertion.  2. Hypertension.  3. Diabetes mellitus.  4. A 25-week intrauterine pregnancy.   PLAN:  [name] is complaining of dyspnea of uncertain etiology.  We  will schedule her to have an echocardiogram to quantify her left  ventricular function and to exclude valvular disease.  The concern would  be a developing cardiomyopathy.  Her blood pressure is elevated today  but she states it is in the 160/100 range even when she is not pregnant.  We will continue with her present dose of labetalol.  I will not  increase this as apparently this is making her feel tired.  We will also  continue with her present  dose of methyldopa.  I will add hydralazine 25  mg p.o. t.i.d. and we will increase this as needed.  She will see me  back in approximately two weeks.     Madolyn Frieze Jens Som, MD, [facility]  Electronically Signed    BSC/MedQ  DD: 07/25/2006  DT: 07/27/2006  Job #: [id number]   cc:   Janine Limbo, M.D.  Naima A. Normand Sloop, M.D.

## 2010-09-28 NOTE — H&P (Signed)
NAME:  [name], [name]                 ACCOUNT NO.:  [id number]   MEDICAL RECORD NO.:  [id number]          PATIENT TYPE:  INP   LOCATION:  9313                          [facility]   PHYSICIAN:  Naima A. Dillard, M.D. DATE OF BIRTH:  1965-09-08   DATE OF ADMISSION:  04/23/2006  DATE OF DISCHARGE:                              HISTORY & PHYSICAL   The patient is a 45 year old gravida 4, para 3 with a due date of  11/09/06 with estimated date of age of 11-3/7 weeks. The patient was  admitted for poorly controlled blood pressures and blood sugars. Her  hemoglobin A1c on 11/29 was 8.  The patient states that her fasting  blood sugars had been around 120 and her two-hours postprandial blood  sugars had been around 1-teens to 140s.  She admits to being  noncompliant with meds, occasionally taking it. Also when she was sent  to me by [facility] her blood sugars were also poorly controlled. The patient  denies any headache, blurred vision, chest pain, shortness of breath or  vaginal bleeding.   Past medical history is significant for type 2 diabetes for 12 years and  chronic hypertension.   ALLERGIES:  No known drug allergies.   Past OB history is significant for cesarean section x2 and a V-BAC x1  without any complications.   Social history is negative x3.   Family history is negative for gyn cancer. She has maternal grandmother  with hypertension. No family history of diabetes.   REVIEW OF SYSTEMS:  CARDIOVASCULAR:  There are no heart palpitations.  NEURO: No muscle weakness. ENDOCRINE: Significant for diabetes.  CARDIOVASCULAR: Significant for hypertension.  GENITOURINARY:  Significant for [redacted] weeks pregnant.   MEDICATIONS:  1. Labetalol 400 mg t.i.d. She missed her last night's dose.  2. Aldomet 250 mg t.i.d.  She missed her last night dose.  3. Fortamet 500 daily and missed her last night dose.   PHYSICAL EXAMINATION:  GENERAL:  The patient is well developed, well  nourished, in no  apparent distress.  VITAL SIGNS: Blood pressure was 210/106 and 162/101.  NECK:  Free range of motion.  HEART: Regular rate and rhythm.  LUNGS:  Clear to auscultation bilaterally.  BREASTS AND GENITOURINARY EXAMS:  Deferred.  EXTREMITIES:  No cyanosis, clubbing or edema.   The patient had an ultrasound today which confirmed 11 weeks and 3 days.  She had a first trimester screen which is pending.   ASSESSMENT:  1. Eleven weeks and 2 days.  2. Type 2 diabetes.  3. Chronic hypertension with poorly blood sugars and blood pressures.   The patient is admitted to the hospital. We increased her labetalol to  600 mg t.i.d., increased her Aldomet to 500 mg t.i.d.  Will give  labetalol IV p.r.n.  We will collect a 24-hour urine for protein and  creatinine clearance.  Maternal fetal medicine is supposed to follow the  patient and will see the patient in the morning.  The patient  understands that the first line of therapy for her diabetes is insulin.  She, however, declined at  this time. She wishes to remain on the  metformin. She understands that it may cross the placenta and cause some  hypoinsulinemia but the patient desires to stay on it. If her blood  sugars are not controlled with the metformin she will change to insulin  and I will put her on insulin sliding scale and after the first  trimester the patient may even try switching to Glyburide. Her first  trimester screen was done today by [facility] and we  will wait for that pending result. Will also check a TSH because her  blood pressures are so high.      Naima A. Normand Sloop, M.D.  Electronically Signed     NAD/MEDQ  D:  04/23/2006  T:  04/23/2006  Job:  [id number]

## 2011-02-28 LAB — BASIC METABOLIC PANEL
BUN: 6
CO2: 31
Chloride: 97
Glucose, Bld: 180 — ABNORMAL HIGH
Potassium: 2.9 — ABNORMAL LOW

## 2011-02-28 LAB — COMPREHENSIVE METABOLIC PANEL
ALT: 29
ALT: 41 — ABNORMAL HIGH
Alkaline Phosphatase: 92
Alkaline Phosphatase: 95
BUN: 10
BUN: 14
CO2: 27
CO2: 33 — ABNORMAL HIGH
Calcium: 8.5
GFR calc non Af Amer: >60
Glucose, Bld: 112 — ABNORMAL HIGH
Glucose, Bld: 61 — ABNORMAL LOW
Potassium: 2.3 — CL
Sodium: 139
Sodium: 141
Total Bilirubin: 0.4
Total Protein: 5.5 — ABNORMAL LOW

## 2011-02-28 LAB — LACTATE DEHYDROGENASE: LDH: 172

## 2011-02-28 LAB — CBC
HCT: 21 — ABNORMAL LOW
Hemoglobin: 6.7 — CL
MCHC: 32
RBC: 3.01 — ABNORMAL LOW
RDW: 18.1 — ABNORMAL HIGH

## 2011-02-28 LAB — URINALYSIS, ROUTINE W REFLEX MICROSCOPIC
Bilirubin Urine: NEGATIVE
Nitrite: NEGATIVE
Protein, ur: NEGATIVE
Specific Gravity, Urine: 1.02
Urobilinogen, UA: 0.2

## 2011-02-28 LAB — URIC ACID: Uric Acid, Serum: 6.8

## 2011-06-24 ENCOUNTER — Other Ambulatory Visit (HOSPITAL_COMMUNITY): Payer: Self-pay | Admitting: *Deleted

## 2011-06-27 ENCOUNTER — Encounter (HOSPITAL_COMMUNITY)
Admission: RE | Admit: 2011-06-27 | Discharge: 2011-06-27 | Disposition: A | Discharge status: Home / Self Care | Payer: Self-pay | Source: Ambulatory Visit | Attending: Internal Medicine | Admitting: Internal Medicine

## 2011-06-27 ENCOUNTER — Encounter (HOSPITAL_COMMUNITY): Payer: Self-pay

## 2011-06-27 DIAGNOSIS — D509 Iron deficiency anemia, unspecified: Secondary | ICD-10-CM | POA: Insufficient documentation

## 2011-06-27 HISTORY — DX: Essential (primary) hypertension: I10

## 2011-06-27 HISTORY — DX: Anemia, unspecified: D64.9

## 2011-06-27 MED ORDER — FERUMOXYTOL INJECTION 510 MG/17 ML
510.0000 mg | Freq: Once | INTRAVENOUS | Status: AC
  Administered 2011-06-27: 510 mg via INTRAVENOUS
  Filled 2011-06-27: qty 17

## 2011-06-27 MED ORDER — SODIUM CHLORIDE 0.9 % IV SOLN
Freq: Once | INTRAVENOUS | Status: AC
  Administered 2011-06-27: 14:00:00 via INTRAVENOUS

## 2011-06-27 NOTE — Discharge Instructions (Signed)
Ferumoxytol injection What is this medicine? FERUMOXYTOL is an iron complex. Iron is used to make healthy red blood cells, which carry oxygen and nutrients throughout the body. This medicine is used to treat iron deficiency anemia in people with chronic kidney disease. This medicine may be used for other purposes; ask your health care provider or pharmacist if you have questions. What should I tell my health care provider before I take this medicine? They need to know if you have any of these conditions: -anemia not caused by low iron levels -high levels of iron in the blood -magnetic resonance imaging (MRI) test scheduled -an unusual or allergic reaction to iron, other medicines, foods, dyes, or preservatives -pregnant or trying to get pregnant -breast-feeding How should I use this medicine? This medicine is for infusion into a vein. It is given by a health care professional in a hospital or clinic setting. Talk to your pediatrician regarding the use of this medicine in children. Special care may be needed. Overdosage: If you think you've taken too much of this medicine contact a poison control center or emergency room at once. Overdosage: If you think you have taken too much of this medicine contact a poison control center or emergency room at once. NOTE: This medicine is only for you. Do not share this medicine with others. What if I miss a dose? It is important not to miss your dose. Call your doctor or health care professional if you are unable to keep an appointment. What may interact with this medicine? This medicine may interact with the following medications: -other iron products This list may not describe all possible interactions. Give your health care provider a list of all the medicines, herbs, non-prescription drugs, or dietary supplements you use. Also tell them if you smoke, drink alcohol, or use illegal drugs. Some items may interact with your medicine. What should I watch  for while using this medicine? Visit your doctor or healthcare professional regularly. Tell your doctor or healthcare professional if your symptoms do not start to get better or if they get worse. You may need blood work done while you are taking this medicine. You may need to follow a special diet. Talk to your doctor. Foods that contain iron include: whole grains/cereals, dried fruits, beans, or peas, leafy green vegetables, and organ meats (liver, kidney). What side effects may I notice from receiving this medicine? Side effects that you should report to your doctor or health care professional as soon as possible: -allergic reactions like skin rash, itching or hives, swelling of the face, lips, or tongue -breathing problems -changes in blood pressure -feeling faint or lightheaded, falls -fever or chills -flushing, sweating, or hot feelings -swelling of the ankles or feet Side effects that usually do not require medical attention (Report these to your doctor or health care professional if they continue or are bothersome.): -diarrhea -headache -nausea, vomiting -stomach pain This list may not describe all possible side effects. Call your doctor for medical advice about side effects. You may report side effects to FDA at 1-800-FDA-1088. Where should I keep my medicine? This drug is given in a hospital or clinic and will not be stored at home. NOTE: This sheet is a summary. It may not cover all possible information. If you have questions about this medicine, talk to your doctor, pharmacist, or health care provider.  2012, [employer]. (01/20/2008 9:48:25 PM) 

## 2011-07-02 ENCOUNTER — Encounter (HOSPITAL_COMMUNITY): Payer: Self-pay

## 2011-07-05 ENCOUNTER — Other Ambulatory Visit (HOSPITAL_COMMUNITY): Payer: Self-pay | Admitting: *Deleted

## 2011-07-09 ENCOUNTER — Encounter (HOSPITAL_COMMUNITY): Payer: Self-pay

## 2011-07-09 ENCOUNTER — Encounter (HOSPITAL_COMMUNITY)
Admission: RE | Admit: 2011-07-09 | Discharge: 2011-07-09 | Disposition: A | Discharge status: Home / Self Care | Payer: Self-pay | Source: Ambulatory Visit | Attending: Internal Medicine | Admitting: Internal Medicine

## 2011-07-09 DIAGNOSIS — D509 Iron deficiency anemia, unspecified: Secondary | ICD-10-CM | POA: Insufficient documentation

## 2011-07-09 MED ORDER — FERUMOXYTOL INJECTION 510 MG/17 ML
510.0000 mg | Freq: Once | INTRAVENOUS | Status: AC
  Administered 2011-07-09: 510 mg via INTRAVENOUS

## 2011-07-09 MED ORDER — SODIUM CHLORIDE 0.9 % IV SOLN
INTRAVENOUS | Status: DC
  Administered 2011-07-09: 250 mL via INTRAVENOUS

## 2011-07-09 NOTE — Discharge Instructions (Signed)
Ferumoxytol injection What is this medicine? FERUMOXYTOL is an iron complex. Iron is used to make healthy red blood cells, which carry oxygen and nutrients throughout the body. This medicine is used to treat iron deficiency anemia in people with chronic kidney disease. This medicine may be used for other purposes; ask your health care provider or pharmacist if you have questions. What should I tell my health care provider before I take this medicine? They need to know if you have any of these conditions: -anemia not caused by low iron levels -high levels of iron in the blood -magnetic resonance imaging (MRI) test scheduled -an unusual or allergic reaction to iron, other medicines, foods, dyes, or preservatives -pregnant or trying to get pregnant -breast-feeding How should I use this medicine? This medicine is for infusion into a vein. It is given by a health care professional in a hospital or clinic setting. Talk to your pediatrician regarding the use of this medicine in children. Special care may be needed. Overdosage: If you think you've taken too much of this medicine contact a poison control center or emergency room at once. Overdosage: If you think you have taken too much of this medicine contact a poison control center or emergency room at once. NOTE: This medicine is only for you. Do not share this medicine with others. What if I miss a dose? It is important not to miss your dose. Call your doctor or health care professional if you are unable to keep an appointment. What may interact with this medicine? This medicine may interact with the following medications: -other iron products This list may not describe all possible interactions. Give your health care provider a list of all the medicines, herbs, non-prescription drugs, or dietary supplements you use. Also tell them if you smoke, drink alcohol, or use illegal drugs. Some items may interact with your medicine. What should I watch  for while using this medicine? Visit your doctor or healthcare professional regularly. Tell your doctor or healthcare professional if your symptoms do not start to get better or if they get worse. You may need blood work done while you are taking this medicine. You may need to follow a special diet. Talk to your doctor. Foods that contain iron include: whole grains/cereals, dried fruits, beans, or peas, leafy green vegetables, and organ meats (liver, kidney). What side effects may I notice from receiving this medicine? Side effects that you should report to your doctor or health care professional as soon as possible: -allergic reactions like skin rash, itching or hives, swelling of the face, lips, or tongue -breathing problems -changes in blood pressure -feeling faint or lightheaded, falls -fever or chills -flushing, sweating, or hot feelings -swelling of the ankles or feet Side effects that usually do not require medical attention (Report these to your doctor or health care professional if they continue or are bothersome.): -diarrhea -headache -nausea, vomiting -stomach pain This list may not describe all possible side effects. Call your doctor for medical advice about side effects. You may report side effects to FDA at 1-800-FDA-1088. Where should I keep my medicine? This drug is given in a hospital or clinic and will not be stored at home. NOTE: This sheet is a summary. It may not cover all possible information. If you have questions about this medicine, talk to your doctor, pharmacist, or health care provider.  2012, [employer]. (01/20/2008 9:48:25 PM) 

## 2012-05-06 ENCOUNTER — Encounter (HOSPITAL_COMMUNITY): Payer: Self-pay

## 2012-05-06 ENCOUNTER — Emergency Department (HOSPITAL_COMMUNITY): Payer: Medicaid Other

## 2012-05-06 ENCOUNTER — Inpatient Hospital Stay (HOSPITAL_COMMUNITY)
Admission: EM | Admit: 2012-05-06 | Discharge: 2012-05-08 | DRG: 293 | Disposition: A | Discharge status: Home / Self Care | Payer: Medicaid Other | Attending: Internal Medicine | Admitting: Internal Medicine

## 2012-05-06 DIAGNOSIS — D259 Leiomyoma of uterus, unspecified: Secondary | ICD-10-CM | POA: Diagnosis present

## 2012-05-06 DIAGNOSIS — R0602 Shortness of breath: Secondary | ICD-10-CM | POA: Diagnosis present

## 2012-05-06 DIAGNOSIS — D509 Iron deficiency anemia, unspecified: Secondary | ICD-10-CM

## 2012-05-06 DIAGNOSIS — IMO0001 Reserved for inherently not codable concepts without codable children: Secondary | ICD-10-CM | POA: Diagnosis present

## 2012-05-06 DIAGNOSIS — E876 Hypokalemia: Secondary | ICD-10-CM | POA: Diagnosis present

## 2012-05-06 DIAGNOSIS — D649 Anemia, unspecified: Secondary | ICD-10-CM

## 2012-05-06 DIAGNOSIS — I110 Hypertensive heart disease with heart failure: Secondary | ICD-10-CM | POA: Diagnosis present

## 2012-05-06 DIAGNOSIS — I509 Heart failure, unspecified: Principal | ICD-10-CM | POA: Diagnosis present

## 2012-05-06 DIAGNOSIS — D500 Iron deficiency anemia secondary to blood loss (chronic): Secondary | ICD-10-CM | POA: Diagnosis present

## 2012-05-06 DIAGNOSIS — N920 Excessive and frequent menstruation with regular cycle: Secondary | ICD-10-CM | POA: Diagnosis present

## 2012-05-06 DIAGNOSIS — E669 Obesity, unspecified: Secondary | ICD-10-CM | POA: Diagnosis present

## 2012-05-06 DIAGNOSIS — Z9119 Patient's noncompliance with other medical treatment and regimen: Secondary | ICD-10-CM

## 2012-05-06 DIAGNOSIS — E119 Type 2 diabetes mellitus without complications: Secondary | ICD-10-CM

## 2012-05-06 DIAGNOSIS — Z79899 Other long term (current) drug therapy: Secondary | ICD-10-CM

## 2012-05-06 DIAGNOSIS — R079 Chest pain, unspecified: Secondary | ICD-10-CM

## 2012-05-06 DIAGNOSIS — E1169 Type 2 diabetes mellitus with other specified complication: Secondary | ICD-10-CM | POA: Diagnosis present

## 2012-05-06 DIAGNOSIS — Z91199 Patient's noncompliance with other medical treatment and regimen due to unspecified reason: Secondary | ICD-10-CM

## 2012-05-06 DIAGNOSIS — I10 Essential (primary) hypertension: Secondary | ICD-10-CM

## 2012-05-06 HISTORY — DX: Pneumonia, unspecified organism: J18.9

## 2012-05-06 HISTORY — DX: Cardiac murmur, unspecified: R01.1

## 2012-05-06 HISTORY — DX: Heart failure, unspecified: I50.9

## 2012-05-06 LAB — PREPARE RBC (CROSSMATCH)

## 2012-05-06 LAB — COMPREHENSIVE METABOLIC PANEL
ALT: 10 U/L (ref 0–35)
Albumin: 3.5 g/dL (ref 3.5–5.2)
Alkaline Phosphatase: 131 U/L — ABNORMAL HIGH (ref 39–117)
Glucose, Bld: 278 mg/dL — ABNORMAL HIGH (ref 70–99)
Potassium: 3.4 mEq/L — ABNORMAL LOW (ref 3.5–5.1)
Sodium: 133 mEq/L — ABNORMAL LOW (ref 135–145)
Total Protein: 7.6 g/dL (ref 6.0–8.3)

## 2012-05-06 LAB — CBC WITH DIFFERENTIAL/PLATELET
Basophils Absolute: 0.1 10*3/uL (ref 0.0–0.1)
Basophils Relative: 1 % (ref 0–1)
Eosinophils Absolute: 0.1 10*3/uL (ref 0.0–0.7)
Hemoglobin: 5.6 g/dL — CL (ref 12.0–15.0)
Lymphocytes Relative: 25 % (ref 12–46)
MCHC: 24.8 g/dL — ABNORMAL LOW (ref 30.0–36.0)
Monocytes Relative: 5 % (ref 3–12)
Neutrophils Relative %: 68 % (ref 43–77)
RBC: 3.97 MIL/uL (ref 3.87–5.11)
WBC: 8.2 10*3/uL (ref 4.0–10.5)

## 2012-05-06 LAB — ABO/RH: ABO/RH(D): B POS

## 2012-05-06 LAB — TROPONIN I: Troponin I: <0.3 ng/mL (ref <0.30)

## 2012-05-06 MED ORDER — INSULIN ASPART 100 UNIT/ML ~~LOC~~ SOLN
0.0000 [IU] | Freq: Three times a day (TID) | SUBCUTANEOUS | Status: DC
  Administered 2012-05-07 (×2): 5 [IU] via SUBCUTANEOUS
  Administered 2012-05-07: 3 [IU] via SUBCUTANEOUS
  Administered 2012-05-08: 8 [IU] via SUBCUTANEOUS
  Administered 2012-05-08: 5 [IU] via SUBCUTANEOUS

## 2012-05-06 MED ORDER — FUROSEMIDE 10 MG/ML IJ SOLN
40.0000 mg | Freq: Once | INTRAMUSCULAR | Status: AC
  Administered 2012-05-06: 40 mg via INTRAVENOUS
  Filled 2012-05-06: qty 4

## 2012-05-06 MED ORDER — INSULIN ASPART 100 UNIT/ML ~~LOC~~ SOLN: 0.0000 [IU] | Freq: Three times a day (TID) | SUBCUTANEOUS | Status: DC

## 2012-05-06 MED ORDER — POTASSIUM CHLORIDE CRYS ER 20 MEQ PO TBCR
40.0000 meq | EXTENDED_RELEASE_TABLET | Freq: Every day | ORAL | Status: DC
  Administered 2012-05-07 – 2012-05-08 (×2): 40 meq via ORAL
  Filled 2012-05-06 (×2): qty 2

## 2012-05-06 MED ORDER — FUROSEMIDE 10 MG/ML IJ SOLN: 40.0000 mg | Freq: Once | INTRAMUSCULAR | Status: DC

## 2012-05-06 MED ORDER — POTASSIUM CHLORIDE 10 MEQ/100ML IV SOLN
10.0000 meq | Freq: Once | INTRAVENOUS | Status: AC
  Administered 2012-05-06: 10 meq via INTRAVENOUS
  Filled 2012-05-06: qty 100

## 2012-05-06 MED ORDER — LISINOPRIL 20 MG PO TABS: 20.0000 mg | ORAL_TABLET | Freq: Every day | ORAL | Status: DC

## 2012-05-06 MED ORDER — LISINOPRIL 20 MG PO TABS
20.0000 mg | ORAL_TABLET | Freq: Every day | ORAL | Status: DC
  Administered 2012-05-06 – 2012-05-08 (×3): 20 mg via ORAL
  Filled 2012-05-06 (×3): qty 1

## 2012-05-06 MED ORDER — SODIUM CHLORIDE 0.9 % IV SOLN: 250.0000 mL | INTRAVENOUS | Status: DC | PRN

## 2012-05-06 MED ORDER — POTASSIUM CHLORIDE CRYS ER 20 MEQ PO TBCR
40.0000 meq | EXTENDED_RELEASE_TABLET | Freq: Once | ORAL | Status: AC
  Administered 2012-05-06: 40 meq via ORAL
  Filled 2012-05-06: qty 2

## 2012-05-06 MED ORDER — SODIUM CHLORIDE 0.9 % IJ SOLN
3.0000 mL | Freq: Two times a day (BID) | INTRAMUSCULAR | Status: DC
  Administered 2012-05-06: 3 mL via INTRAVENOUS

## 2012-05-06 MED ORDER — SODIUM CHLORIDE 0.9 % IJ SOLN: 3.0000 mL | INTRAMUSCULAR | Status: DC | PRN

## 2012-05-06 MED ORDER — FUROSEMIDE 10 MG/ML IJ SOLN
40.0000 mg | Freq: Two times a day (BID) | INTRAMUSCULAR | Status: AC
  Administered 2012-05-07 (×2): 40 mg via INTRAVENOUS
  Filled 2012-05-06 (×4): qty 4

## 2012-05-06 MED ORDER — FUROSEMIDE 10 MG/ML IJ SOLN
40.0000 mg | Freq: Once | INTRAMUSCULAR | Status: AC
  Administered 2012-05-07: 40 mg via INTRAVENOUS

## 2012-05-06 MED ORDER — FUROSEMIDE 10 MG/ML IJ SOLN: 40.0000 mg | Freq: Two times a day (BID) | INTRAMUSCULAR | Status: DC

## 2012-05-06 MED ORDER — SODIUM CHLORIDE 0.9 % IJ SOLN
3.0000 mL | Freq: Two times a day (BID) | INTRAMUSCULAR | Status: DC
  Administered 2012-05-06 – 2012-05-08 (×4): 3 mL via INTRAVENOUS

## 2012-05-06 MED ORDER — HYDRALAZINE HCL 10 MG PO TABS
10.0000 mg | ORAL_TABLET | Freq: Three times a day (TID) | ORAL | Status: DC
  Administered 2012-05-06 – 2012-05-07 (×2): 10 mg via ORAL
  Filled 2012-05-06 (×5): qty 1

## 2012-05-06 NOTE — ED Notes (Signed)
Attempted to call report-RN to call me back.

## 2012-05-06 NOTE — H&P (Addendum)
[facility] History and Physical  [name] ZOX:[id number] DOB: [birth year] DOA: 05/06/2012  Referring physician: Dr Preston Fleeting PCP: Does not have a PCP  Chief Complaint:  Progressive Shortness of breath since 3-4 weeks   HPI:  46 year old obese female with history off diabetes, hypertension who presented to the ED with progressive shortness of breath since past 3-4 weeks which has worsened in last 2 days. Patient informs off having exertional shortness of breath during these few weeks worsened with ambulation and climbing stairs associated with nonproductive cough. For past 2 days she has dyspnea on rest associated with orthopnea and PND . She also has been experiencing some palpitations with shortness of breath. She also has noticed that her boots are tight.  She also experienced some sweating and excessive urination for last few days. She has been having heavy menstrual bleeding with clots that last for 4-5 days and was diagnosed of having fibroid about 2 years back but never received any treatment. She lost her insurance 6 months back following which she has not received any medical care or taken any medications. She denies any fever, chills, chest pain, abdominal pain, nausea, vomiting, dizziness, headache, bowel or urinary symptoms. Denies recent illness or viral infection. In the ED she was noted to be mildly tachycardic with blood work done showing a hemoglobin of 5.6, mild hypokalemia, and elevated proBNP in 500s and a chest x-ray showing mild  interstitial edema. Patient was given a dose of IV Lasix 40 mg, type and screen and iron panel sent and [facility] called for admission to telemetry.  Review of Systems: (Positive symptoms in bold) Constitutional: Increased fatigue, diaphoresis, Denies fever, chills,  appetite change. HEENT: Denies photophobia, eye pain, redness, hearing loss, ear pain, congestion, sore throat, rhinorrhea, sneezing, mouth sores, trouble swallowing, neck  pain, neck stiffness and tinnitus.   Respiratory:  SOB, DOE, cough, Denies chest tightness,  and wheezing.   Cardiovascular: Dyspnea on exertion, orthopnea, PND and leg swellings . Denies chest pain, palpitations. Gastrointestinal: Denies nausea, vomiting, abdominal pain, diarrhea, constipation, blood in stool and abdominal distention.  Genitourinary: Increase urinary frequency. Denies dysuria, urgency,hematuria, flank pain and difficulty urinating.  severe menorrhagia Musculoskeletal: Denies myalgias, back pain, joint swelling, arthralgias and gait problem.  Skin: Denies pallor, rash and wound.  Neurological: Denies dizziness, seizures, syncope, weakness, light-headedness, numbness and headaches.  Hematological: Denies adenopathy. Easy bruising, personal or family bleeding history  Psychiatric/Behavioral: Denies suicidal ideation, mood changes, confusion, nervousness, sleep disturbance and agitation   Past Medical History  Diagnosis Date  . Anemia   . Hypertension   . Diabetes mellitus       history of fibroid uterus. Past Surgical History  Procedure Date  . Cesarean section    Social History:  reports that she has never smoked. She does not have any smokeless tobacco history on file. She reports that she does not drink alcohol or use illicit drugs.  No Known Allergies  Family history : Not asked  Prior to Admission medications   Medication Sig Start Date End Date Taking? Authorizing Provider  carvedilol (COREG) 3.125 MG tablet Take 6.25 mg by mouth 2 (two) times daily with a meal.  12/03/10   Historical Provider, MD  metFORMIN (GLUCOPHAGE) 1000 MG tablet Take 1,000 mg by mouth 2 (two) times daily at 10 AM and 5 PM.  12/15/09   Historical Provider, MD  Olmesartan-Amlodipine-HCTZ (TRIBENZOR) 40-10-25 MG TABS Take 1 tablet by mouth daily. 06/21/11   Historical Provider, MD  Physical Exam:  Filed Vitals:   05/06/12 1401 05/06/12 1419 05/06/12 1444 05/06/12 1445  BP: 206/124 207/111  197/98   Pulse: 116     Temp: 99.1 F (37.3 C)  98.3 F (36.8 C)   TempSrc: Oral  Oral   Resp: 22  23   Height:   5\' 4"  (1.626 m)   Weight:   94.348 kg (208 lb)   SpO2: 98%  97% 99%    Constitutional: Vital signs reviewed.  Patient is a well-developed and well-nourished in no acute distress and cooperative with exam. Alert and oriented x3.  Head: Normocephalic and atraumatic Ear: TM normal bilaterally Mouth: no erythema or exudates, MMM Eyes: PERRL, EOMI, pallor present,  No scleral icterus.  Neck: Supple, Trachea midline normal ROM, No JVD, mass, thyromegaly, or carotid bruit present.  Cardiovascular: RRR, S1 normal, S2 normal, no MRG, pulses symmetric and intact bilaterally Pulmonary/Chest: CTAB, no wheezes, rales, or rhonchi Abdominal: Soft. Non-tender, non-distended, bowel sounds are normal, no masses, organomegaly, or guarding present.  GU: no CVA tenderness Musculoskeletal: No joint deformities, erythema, or stiffness, ROM full and no nontender Ext: Trace edema, no cyanosis, pulses palpable bilaterally (DP and PT) Hematology: no cervical, inginal, or axillary adenopathy.  Neurological: A&O x3, Strenght is normal and symmetric bilaterally, cranial nerve II-XII are grossly intact, no focal motor deficit, sensory intact to light touch bilaterally.  Skin: Warm, dry and intact. No rash, cyanosis, or clubbing.  Psychiatric: Normal mood and affect. speech and behavior is normal. Judgment and thought content normal. Cognition and memory are normal.   Labs on Admission:  Basic Metabolic Panel:  Lab 05/06/12 1610  NA 133*  K 3.4*  CL 96  CO2 24  GLUCOSE 278*  BUN 10  CREATININE 0.52  CALCIUM 9.3  MG --  PHOS --   Liver Function Tests:  Lab 05/06/12 1406  AST 21  ALT 10  ALKPHOS 131*  BILITOT 0.4  PROT 7.6  ALBUMIN 3.5    Lab 05/06/12 1406  LIPASE 27  AMYLASE --   No results found for this basename: AMMONIA:5 in the last 168 hours CBC:  Lab 05/06/12 1406  WBC  8.2  NEUTROABS 5.5  HGB 5.6*  HCT 22.6*  MCV 56.9*  PLT 470*   Cardiac Enzymes:  Lab 05/06/12 1512  CKTOTAL --  CKMB --  CKMBINDEX --  TROPONINI <0.30   BNP: No components found with this basename: POCBNP:5 CBG:  Lab 05/06/12 1436  GLUCAP 268*    Radiological Exams on Admission: Dg Chest 2 View  05/06/2012  *RADIOLOGY REPORT*  Clinical Data: Shortness of breath.  Chest pressure.  CHEST - 2 VIEW  Comparison: PA and lateral chest 10/03/2005 and 09/29/2006.  Findings: There is cardiomegaly and mild interstitial edema.  No consolidative process, pneumothorax or effusion.  IMPRESSION: Cardiomegaly and mild interstitial edema.   Original Report Authenticated By: Holley Dexter, M.D.     EKG: Sinus tachycardia at 107, no ST-T changes  Assessment/Plan Acute CHF Possibly in the setting off uncontrolled hypertension and anemia. -Admit to medical floor on telemetry. -Check serial cardiac enzymes. Monitor I/O, daily weights. Will order IV Lasix 40 mg every 12 hours. Check 2-D echo. Check TSH. -Will [facility] her [facility] with [facility] as outpatient. Will obtain nutrition consults to provide adequate counseling on proper diet and exercise.  Anemia Likely in the setting off severe menorrhagia secondary to fibroid uterus with CBC showing microcytic picture. Iron panel sent and will  follow. -Patient is symptomatic. Will order for 2 units packed red cell with slow transfusion given CHF symptoms. Will order for IV Lasix 40 mg in between transfusion and after transfusion. -Monitor serial H&H -Will need to establish care with GYN if the discharge for her fibroid uterus and severe menorrhagia.  Uncontrolled hypertension Patient has not taken any blood pressure medications for past 6 months. She is on scheduled IV Lasix. I will start her on lisinopril and schedule hydralazine.  Diabetes mellitus Patient has not taken any medications for past 6 months and was previously on  metformin. I will check a hemoglobin A1c level. I will place her on sliding scale insulin. Patient counseled on diet and exercise.   DVT prophylaxis: SCD boots Diet: Diabetic/ cardiac  CODE STATUS: Full code  Family Communication: Husband at bedside Disposition Plan: Home once stable    Eddie North [facility] Pager [phone] If 7PM-7AM please page NP/PA on call 05/06/2012, 6:42 PM      Total time spent on admission 70 minutes

## 2012-05-06 NOTE — ED Provider Notes (Signed)
History     CSN: [id number]  Arrival date & time 05/06/12  1357   First MD Initiated Contact with Patient 05/06/12 1413      Chief Complaint  Patient presents with  . Shortness of Breath    (Consider location/radiation/quality/duration/timing/severity/associated sxs/prior treatment) Patient is a 46 y.o. female presenting with shortness of breath. The history is provided by the patient.  Shortness of Breath  Associated symptoms include shortness of breath.  She had been on medication for hypertension and diabetes, but now out of medication 6 months ago and has not been able to get them refilled. For the last 2 months, she has had a nonproductive cough and progressive dyspnea on exertion. At this point, she has to stop about halfway up a flight of steps before she can continue. She is also noted orthopnea. She denies chest pain, heaviness, tightness, pressure. There's not been any abdominal pain or nausea or vomiting. She has had constipation. She denies fever or chills.  Past Medical History  Diagnosis Date  . Anemia   . Hypertension   . Diabetes mellitus     Past Surgical History  Procedure Date  . Cesarean section     No family history on file.  History  Substance Use Topics  . Smoking status: Never Smoker   . Smokeless tobacco: Not on file  . Alcohol Use: No    OB History    Grav Para Term Preterm Abortions TAB SAB Ect Mult Living                  Review of Systems  Respiratory: Positive for shortness of breath.   All other systems reviewed and are negative.    Allergies  Review of patient's allergies indicates no known allergies.  Home Medications   Current Outpatient Rx  Name  Route  Sig  Dispense  Refill  . CARVEDILOL 3.125 MG PO TABS   Oral   Take 6.25 mg by mouth 2 (two) times daily with a meal.          . METFORMIN HCL 1000 MG PO TABS   Oral   Take 1,000 mg by mouth 2 (two) times daily at 10 AM and 5 PM.          .  OLMESARTAN-AMLODIPINE-HCTZ 40-10-25 MG PO TABS   Oral   Take 1 tablet by mouth daily.           BP 197/98  Pulse 116  Temp 98.3 F (36.8 C) (Oral)  Resp 23  Ht 5\' 4"  (1.626 m)  Wt 208 lb (94.348 kg)  BMI 35.70 kg/m2  SpO2 99%  LMP 04/21/2012  Physical Exam  Nursing note and vitals reviewed. 46 year old female, resting comfortably and in no acute distress. Vital signs are significant for hypertension with blood pressure 197/98, and tachycardia with heart rate 116, and tachypnea with respiratory rate of 23. Oxygen saturation is 99%, which is normal. Head is normocephalic and atraumatic. PERRLA, EOMI. Oropharynx is clear. Fundi show no hemorrhage, exudate, or papilledema. Neck is nontender and supple without adenopathy. JVD is noted when sitting at 60. Back is nontender and there is no CVA tenderness. Lungs have faint bibasilar rales. No wheezes or rhonchi are heard. Chest is nontender. Heart has regular rate and rhythm without murmur. Abdomen is soft, flat, nontender without masses or hepatosplenomegaly and peristalsis is normoactive. Extremities have 1+ edema, no cyanosis. Full range of motion is present. Skin is warm and dry without rash. Neurologic:  Mental status is normal, cranial nerves are intact, there are no motor or sensory deficits.   ED Course  Procedures (including critical care time)  Results for orders placed during the hospital encounter of 05/06/12  CBC WITH DIFFERENTIAL      Component Value Range   WBC 8.2  4.0 - 10.5 K/uL   RBC 3.97  3.87 - 5.11 MIL/uL   Hemoglobin 5.6 (*) 12.0 - 15.0 g/dL   HCT 16.1 (*) 09.6 - 04.5 %   MCV 56.9 (*) 78.0 - 100.0 fL   MCH 14.1 (*) 26.0 - 34.0 pg   MCHC 24.8 (*) 30.0 - 36.0 g/dL   RDW 40.9 (*) 81.1 - 91.4 %   Platelets 470 (*) 150 - 400 K/uL   Neutrophils Relative 68  43 - 77 %   Lymphocytes Relative 25  12 - 46 %   Monocytes Relative 5  3 - 12 %   Eosinophils Relative 1  0 - 5 %   Basophils Relative 1  0 - 1 %    Neutro Abs 5.5  1.7 - 7.7 K/uL   Lymphs Abs 2.1  0.7 - 4.0 K/uL   Monocytes Absolute 0.4  0.1 - 1.0 K/uL   Eosinophils Absolute 0.1  0.0 - 0.7 K/uL   Basophils Absolute 0.1  0.0 - 0.1 K/uL   RBC Morphology POLYCHROMASIA PRESENT    COMPREHENSIVE METABOLIC PANEL      Component Value Range   Sodium 133 (*) 135 - 145 mEq/L   Potassium 3.4 (*) 3.5 - 5.1 mEq/L   Chloride 96  96 - 112 mEq/L   CO2 24  19 - 32 mEq/L   Glucose, Bld 278 (*) 70 - 99 mg/dL   BUN 10  6 - 23 mg/dL   Creatinine, Ser 7.82  0.50 - 1.10 mg/dL   Calcium 9.3  8.4 - 95.6 mg/dL   Total Protein 7.6  6.0 - 8.3 g/dL   Albumin 3.5  3.5 - 5.2 g/dL   AST 21  0 - 37 U/L   ALT 10  0 - 35 U/L   Alkaline Phosphatase 131 (*) 39 - 117 U/L   Total Bilirubin 0.4  0.3 - 1.2 mg/dL   GFR calc non Af Amer >90  >90 mL/min   GFR calc Af Amer >90  >90 mL/min  LIPASE, BLOOD      Component Value Range   Lipase 27  11 - 59 U/L  GLUCOSE, CAPILLARY      Component Value Range   Glucose-Capillary 268 (*) 70 - 99 mg/dL  PRO B NATRIURETIC PEPTIDE      Component Value Range   Pro B Natriuretic peptide (BNP) 568.3 (*) 0 - 125 pg/mL  TROPONIN I      Component Value Range   Troponin I <0.30  <0.30 ng/mL  OCCULT BLOOD, POC DEVICE      Component Value Range   Fecal Occult Bld NEGATIVE  NEGATIVE   Dg Chest 2 View  05/06/2012  *RADIOLOGY REPORT*  Clinical Data: Shortness of breath.  Chest pressure.  CHEST - 2 VIEW  Comparison: PA and lateral chest 10/03/2005 and 09/29/2006.  Findings: There is cardiomegaly and mild interstitial edema.  No consolidative process, pneumothorax or effusion.  IMPRESSION: Cardiomegaly and mild interstitial edema.   Original Report Authenticated By: Holley Dexter, M.D.       Date: 05/06/2012  Rate: 114  Rhythm: sinus tachycardia  QRS Axis: normal  Intervals: normal  ST/T Wave abnormalities:  normal  Conduction Disutrbances:none  Narrative Interpretation: Sinus tachycardia, left atrial hypertrophy. No prior ECG  available for comparison.  Old EKG Reviewed: none available    1. CHF (congestive heart failure)   2. Hypertension   3. Microcytic anemia       MDM  Dyspnea on exertion and orthopnea which most likely represents congestive heart failure. Uncontrolled hypertension which is probably the reason for her heart failure.  Hemoglobin has come back 5.6. Patient admits to history of heavy menses. Rectal exam shows light brown stool which is sent for hemoccult.  Stool Hemoccult is negative. At this point, it seems that her anemia is more important cause of her CHF and her hypertension, although both will need to be addressed. Case is discussed with Dr. Gonzella Lex of [facility] who agrees to admit the patient.   Dione Booze, MD 05/06/12 [phone]

## 2012-05-06 NOTE — ED Notes (Signed)
Patient has been off of her medications for apprx 6 months due to lack of insurance. She has had a cough x 2 month, dry, nonproductive. She has been nauseated, weakness. Denies vomiting, fever or chills. Patient states her belly feels bloated.

## 2012-05-06 NOTE — ED Notes (Signed)
Pt presents with 2 week h/o shortness of breath that has worsened x 2-3 days.  Pt reports dyspnea especially with exertion.  Pt reports dry cough x 1 month that initially was relieved but recently worsened.  Pt reports h/o HTN and DM, has not been on medication x 6 months.  Pt reports abdominal distension x 2 days with +nausea, denies any abdominal pain , reports last bowel movement was 2 days ago.

## 2012-05-07 ENCOUNTER — Encounter (HOSPITAL_COMMUNITY): Payer: Self-pay | Admitting: Physician Assistant

## 2012-05-07 DIAGNOSIS — D509 Iron deficiency anemia, unspecified: Secondary | ICD-10-CM

## 2012-05-07 DIAGNOSIS — R0602 Shortness of breath: Secondary | ICD-10-CM | POA: Diagnosis present

## 2012-05-07 DIAGNOSIS — I509 Heart failure, unspecified: Principal | ICD-10-CM

## 2012-05-07 LAB — BASIC METABOLIC PANEL
BUN: 8 mg/dL (ref 6–23)
Chloride: 98 mEq/L (ref 96–112)
GFR calc Af Amer: >90 mL/min (ref >90)
Glucose, Bld: 244 mg/dL — ABNORMAL HIGH (ref 70–99)
Potassium: 3.5 mEq/L (ref 3.5–5.1)

## 2012-05-07 LAB — IRON AND TIBC
Iron: 10 ug/dL — ABNORMAL LOW (ref 42–135)
TIBC: 533 ug/dL — ABNORMAL HIGH (ref 250–470)

## 2012-05-07 LAB — HEMOGLOBIN A1C: Mean Plasma Glucose: 220 mg/dL — ABNORMAL HIGH (ref <117)

## 2012-05-07 LAB — CBC
HCT: 27 % — ABNORMAL LOW (ref 36.0–46.0)
Hemoglobin: 7.5 g/dL — ABNORMAL LOW (ref 12.0–15.0)
MCHC: 27.8 g/dL — ABNORMAL LOW (ref 30.0–36.0)
RBC: 4.46 MIL/uL (ref 3.87–5.11)

## 2012-05-07 LAB — VITAMIN B12: Vitamin B-12: 764 pg/mL (ref 211–911)

## 2012-05-07 LAB — GLUCOSE, CAPILLARY: Glucose-Capillary: 299 mg/dL — ABNORMAL HIGH (ref 70–99)

## 2012-05-07 MED ORDER — HYDRALAZINE HCL 25 MG PO TABS
25.0000 mg | ORAL_TABLET | Freq: Three times a day (TID) | ORAL | Status: DC
  Administered 2012-05-07 – 2012-05-08 (×4): 25 mg via ORAL
  Filled 2012-05-07 (×6): qty 1

## 2012-05-07 MED ORDER — REGADENOSON 0.4 MG/5ML IV SOLN
0.4000 mg | Freq: Once | INTRAVENOUS | Status: AC
  Administered 2012-05-08: 0.4 mg via INTRAVENOUS
  Filled 2012-05-07: qty 5

## 2012-05-07 MED ORDER — CARVEDILOL 6.25 MG PO TABS
6.2500 mg | ORAL_TABLET | Freq: Two times a day (BID) | ORAL | Status: DC
  Administered 2012-05-07 – 2012-05-08 (×2): 6.25 mg via ORAL
  Filled 2012-05-07 (×6): qty 1

## 2012-05-07 MED ORDER — FUROSEMIDE 40 MG PO TABS
40.0000 mg | ORAL_TABLET | Freq: Every day | ORAL | Status: DC
  Administered 2012-05-08: 40 mg via ORAL
  Filled 2012-05-07: qty 1

## 2012-05-07 MED ORDER — MAGNESIUM HYDROXIDE 400 MG/5ML PO SUSP
30.0000 mL | Freq: Every day | ORAL | Status: DC | PRN
  Administered 2012-05-07: 30 mL via ORAL
  Filled 2012-05-07: qty 30

## 2012-05-07 NOTE — Progress Notes (Signed)
  Echocardiogram 2D Echocardiogram has been performed.  [name] 05/07/2012, 9:06 AM

## 2012-05-07 NOTE — Consult Note (Signed)
CARDIOLOGY CONSULT NOTE   Patient ID: [name] MRN: [id number] DOB/AGE: May 22, 1965 46 y.o.  Admit date: 05/06/2012  Primary Physician   Default, Provider, MD Primary Cardiologist   [facility]  Reason for Consultation   CHF  [name] is a 46 y.o. female with no history of CAD. She was admitted yesterday with anemia (Hgb 5.6) and SOB. She was transfused and her anemia improved but her CXR showed edema so cardiology was asked to evaluate her.   Ms. [name] has never had chest pain. She has chronic anemia from heavy menses. At one point she was given the option of a uterine ablation or hysterectomy but has not followed up on those options. For the last several months, she has noticed increasing dyspnea on exertion such as having to stop walking up steps, etc. Her symptoms have gradually progressed and over the last 2 or 3 days, she has noticed dyspnea walking 30 feet or less. She was also having a dry cough. She describes PND and orthopnea. She has not weighed and is not aware of weight gain, but noticed some shoes were tighter than usual. She has been hungry for ice recently and was not surprised to find out that her iron was low. She admits to not following through on health care but states it is because of financial issues. She has been unemployed and sites this as the reason for her lack of medical care. Since being in the hospital, getting transfused and getting IV Lasix, she feels much better. Currently, she feels her respiratory status is at baseline even though her hemoglobin is currently 7.5.   Past Medical History  Diagnosis Date  . Anemia   . Hypertension   . Diabetes mellitus   . Shortness of breath   . Pneumonia   . Heart murmur   . CHF (congestive heart failure)      Past Surgical History  Procedure Date  . Cesarean [name]     No Known Allergies  I have reviewed the patient's current medications    . furosemide  40 mg Intravenous Once  . furosemide  40 mg  Intravenous BID  . hydrALAZINE  25 mg Oral Q8H  . insulin aspart  0-15 Units Subcutaneous TID WC  . lisinopril  20 mg Oral Daily  . potassium chloride  40 mEq Oral Daily  . sodium chloride  3 mL Intravenous Q12H  . sodium chloride  3 mL Intravenous Q12H     sodium chloride, magnesium hydroxide, sodium chloride  Prior to Admission medications - NONE, out of everything for a long time.   Medication Sig Start Date End Date Taking? Authorizing Provider  carvedilol (COREG) 3.125 MG tablet Take 6.25 mg by mouth 2 (two) times daily with a meal.  12/03/10   Historical Provider, MD  metFORMIN (GLUCOPHAGE) 1000 MG tablet Take 1,000 mg by mouth 2 (two) times daily at 10 AM and 5 PM.  12/15/09   Historical Provider, MD  Olmesartan-Amlodipine-HCTZ (TRIBENZOR) 40-10-25 MG TABS Take 1 tablet by mouth daily. 06/21/11   Historical Provider, MD     History   Social History  . Marital Status: Legally Separated    Spouse Name: N/A    Number of Children: N/A  . Years of Education: N/A   Occupational History  . unemployed    Social History Main Topics  . Smoking status: Never Smoker   . Smokeless tobacco: Never Used  . Alcohol Use: Yes     Comment:  occasional  . Drug Use: No  . Sexually Active: Yes   Other Topics Concern  . Not on file   Social History Narrative   Lives with her 4 children.    Family Status  Relation Status Death Age  . Father Deceased 74    Cancer, no CAD  . Mother Alive     No CAD    Family History  Problem Relation Age of Onset  . Hypertension Mother   . Hypertension Brother      ROS: She denies fevers, chills or productive cough. There has been no melena. Except for heavy menses, she is unaware of any source of blood loss. Full 14 point review of systems complete and found to be negative unless listed above.  Physical Exam: Blood pressure 158/97, pulse 104, temperature 98.6 F (37 C), temperature source Oral, resp. rate 18, height 5\' 4"  (1.626 m), weight 202 lb  3.2 oz (91.717 kg), last menstrual period 04/21/2012, SpO2 99.00%.  General: Well developed, well nourished, female in no acute distress Head: Eyes PERRLA, No xanthomas.   Normocephalic and atraumatic, oropharynx without edema or exudate. Dentition: good Lungs: Clear bilaterally  Heart: HRRR S1 S2, no rub/gallop, soft systolic  murmur. pulses are 2+ all 4 extrem.   Neck: No carotid bruits. No lymphadenopathy.  JVD at 8 cm. Abdomen: Bowel sounds present, abdomen soft and non-tender without masses or hernias noted. Msk:  No spine or cva tenderness. No weakness, no joint deformities or effusions. Extremities: No clubbing or cyanosis. No edema.  Neuro: Alert and oriented X 3. No focal deficits noted. Psych:  Good affect, responds appropriately Skin: No rashes or lesions noted.  Labs:   Lab Results  Component Value Date   WBC 6.7 05/07/2012   HGB 7.5* 05/07/2012   HCT 27.0* 05/07/2012   MCV 60.5* 05/07/2012   PLT 424* 05/07/2012   No results found for this basename: INR in the last 72 hours   Lab 05/07/12 0550 05/06/12 1406  NA 137 --  K 3.5 --  CL 98 --  CO2 29 --  BUN 8 --  CREATININE 0.63 --  CALCIUM 8.8 --  PROT -- 7.6  BILITOT -- 0.4  ALKPHOS -- 131*  ALT -- 10  AST -- 21  GLUCOSE 244* --    Basename 05/07/12 1054 05/06/12 1512  CKTOTAL -- --  CKMB -- --  TROPONINI <0.30 <0.30   Pro B Natriuretic peptide (BNP)  Date/Time Value Range Status  05/06/2012  3:12 PM 568.3* 0 - 125 pg/mL Final   Lipase  Date/Time Value Range Status  05/06/2012  2:06 PM 27  11 - 59 U/L Final   TSH  Date/Time Value Range Status  05/06/2012  4:45 PM 0.825  0.350 - 4.500 uIU/mL Final   Vitamin B-12  Date/Time Value Range Status  05/06/2012  4:45 PM 764  211 - 911 pg/mL Final     Folate  Date/Time Value Range Status  05/06/2012  4:45 PM 15.3   Final     (NOTE)     Reference Ranges            Deficient:       0.4 - 3.3 ng/mL            Indeterminate:   3.4 - 5.4 ng/mL             Normal:              > 5.4 ng/mL  Ferritin  Date/Time Value Range Status  05/06/2012  4:45 PM 3* 10 - 291 ng/mL Final     TIBC  Date/Time Value Range Status  05/06/2012  4:45 PM 533* 250 - 470 ug/dL Final     Iron  Date/Time Value Range Status  05/06/2012  4:45 PM 10* 42 - 135 ug/dL Final     Retic Ct Pct  Date/Time Value Range Status  05/06/2012  4:45 PM 2.6  0.4 - 3.1 % Final   Lab Results  Component Value Date   HGBA1C 9.3* 05/06/2012    Echo: 05/07/2012 Study Conclusions - Left ventricle: Mid and basal inferior wall akinesis The cavity size was normal. Wall thickness was increased in a pattern of mild LVH. The estimated ejection fraction was 50%. - Mitral valve: Mild regurgitation. - Left atrium: The atrium was moderately dilated. - Atrial septum: No defect or patent foramen ovale was identified.  ECG: 06-May-2012 14:12:55 [facility]/ED ROUTINE RECORD Sinus tachycardia Possible Left atrial enlargement Borderline ECG 34mm/s 79mm/mV 100Hz  8.0.1 12SL 241 CID: 1 Referred by: Unconfirmed Vent. rate 114 BPM PR interval 150 ms QRS duration 86 ms QT/QTc 360/496 ms P-R-T axes 66 65 53  Radiology:  Dg Chest 2 View 05/06/2012  *RADIOLOGY REPORT*  Clinical Data: Shortness of breath.  Chest pressure.  CHEST - 2 VIEW  Comparison: PA and lateral chest 10/03/2005 and 09/29/2006.  Findings: There is cardiomegaly and mild interstitial edema.  No consolidative process, pneumothorax or effusion.  IMPRESSION: Cardiomegaly and mild interstitial edema.   Original Report Authenticated By: Holley Dexter, M.D.     ASSESSMENT AND PLAN:   The patient was seen today by Dr Johney Frame, the patient evaluated and the data reviewed.   CHF, acute - The patient needs a little more diuresis but should respond well to Lasix. Continue to follow volume status closely. Can plan to change to PO Rx in am.  ?CAD - She has never had ischemic symptoms and her ECG has no Q waves or  ST elevation. Her echo is a little abnormal so will need risk stratification with stress test - can do Lexiscan in am. Will add Coreg 6.25 bid and titrate for BP/HR control. Should be as affordable as anything else.   Otherwise, per primary MD. Active Problems:  DIABETES MELLITUS  ANEMIA  Uncontrolled hypertension  Hypokalemia   Signed: Theodore Demark 05/07/2012, 2:19 PM Co-Sign MD  I have seen, examined the patient, and reviewed the above assessment and plan.  Changes to above are made where necessary.  The patient presents with progressive SOB.  I suspect that this is primarily related to her anemia.  She feels much better with BRBCs though she has also been diuresed.  I will convert lasix to PO at this time.  The primary team will continue to optimize treatment for anemia.  Given WMA on echo and multiple CAD risk factors, we will obtain a lexiscan myoview tomorrow.  If low risk, then medical management would be best.  We will consider cath if there is a large territory of ischemia.  Co Sign: Hillis Range, MD 05/07/2012 4:34 PM

## 2012-05-07 NOTE — Plan of Care (Signed)
Problem: Undesirable Food Choices (NB-1.7) Goal: Nutrition education Formal process to instruct or train a patient/client in a skill or to impart knowledge to help patients/clients voluntarily manage or modify food choices and eating behavior to maintain or improve health.  Outcome: Completed/Met Date Met:  05/07/12  RD consulted for nutrition education regarding diabetes.     Lab Results  Component Value Date    HGBA1C 9.3* 05/06/2012    RD provided "Carbohydrate Counting for People with Diabetes" handout from the Academy of Nutrition and Dietetics. Discussed different food groups and their effects on blood sugar, emphasizing carbohydrate-containing foods. Provided list of carbohydrates and recommended serving sizes of common foods.  Discussed importance of controlled and consistent carbohydrate intake throughout the day. Provided examples of ways to balance meals/snacks and encouraged intake of high-fiber, whole grain complex carbohydrates. Teach back method used.  Expect fair compliance.  Body mass index is 34.71 kg/(m^2). Pt meets criteria for Obesity class 1 based on current BMI.  Current diet order is CMM, patient is consuming approximately 0% of meals at this time. Labs and medications reviewed. No further nutrition interventions warranted at this time. RD contact information provided. If additional nutrition issues arise, please re-consult RD.  Clarene Duke RD, LDN Pager [phone] After Hours pager [phone]

## 2012-05-07 NOTE — Progress Notes (Addendum)
Inpatient Diabetes Program Recommendations  AACE/ADA: New Consensus Statement on Inpatient Glycemic Control (2013)  Target Ranges:  Prepandial:   less than 140 mg/dL      Peak postprandial:   less than 180 mg/dL (1-2 hours)      Critically ill patients:  140 - 180 mg/dL   Reason for Visit: Results for [name], [name] (MRN [id number]) as of 05/07/2012 13:00  Ref. Range 05/06/2012 23:52 05/07/2012 06:44 05/07/2012 11:38  Glucose-Capillary Latest Range: 70-99 mg/dL 147 (H) 829 (H) 562 (H)   Please add Lantus 15 units daily.  Note that A1C is 9.3%.  Patient would likely benefit from the addition of basal insulin at home due to elevated A1C.

## 2012-05-07 NOTE — Progress Notes (Addendum)
[facility] PROGRESS NOTE  [name] ZOX:[id number] DOB: 1966-02-09 DOA: 05/06/2012 PCP: Default, Provider, MD  Brief narrative 46 year old obese female with history of diabetes and hypertension (not on any medications 6 months when [facility]), fibroid uterus with heavy menstrual bleeding presented with shortness of breath for past 4 weeks worsened over last few days in the setting off new onset CHF with anemia and uncontrolled hypertension.    Assessment/Plan: Acute CHF  Possibly in the setting off uncontrolled hypertension and anemia. Follow serial cardiac enzyme. Continue telemetry monitoring. Continue with IV Lasix 40 mg every 12 hours. 2-D echo showing EF of 50% with inferior wall akinesis.? Underlying ischemia. We'll get cardiology evaluation. -TSH normal -Will get nutrition consult for counseling on adequate diet and exercise. -monitor I/O and daily weights  Anemia  Likely in the setting off severe menorrhagia secondary to fibroid uterus with CBC showing microcytic picture. - -Iron panel for this time deficiency. Hemoglobin improved with 2 units PRBC. Will discharge on iron supplements.  -Monitor serial H&H  -Will need to establish care with GYN if the discharge for her fibroid uterus and severe menorrhagia.   Uncontrolled hypertension  Patient has not taken any blood pressure medications for past 6 months. She is on scheduled IV Lasix.   started  her on lisinopril and schedule hydralazine. will increase dose.  Diabetes mellitus  Patient has not taken any medications for past 6 months and was previously on metformin.  follow hemoglobin A1c level. Continue sliding scale insulin. Her fingerstick is elevated. Patient counseled on diet and exercise.   DVT prophylaxis: SCD boots  Diet: Diabetic/ cardiac    Code Status: Full code Family Communication: Husband at bedside was updated. Disposition Plan: Home once acute issues resolve likely 1-2  days   Consultants:  [facility] consulted  Procedures:  None  Antibiotics:  None  HPI/Subjective: Patient feels much better after getting blood transfusion and IV Lasix. 2-D echo results discussed  Objective: Filed Vitals:   05/07/12 0346 05/07/12 0412 05/07/12 0636 05/07/12 0956  BP: 176/107 168/99  158/97  Pulse: 96 90  104  Temp: 98.3 F (36.8 C) 98.6 F (37 C)    TempSrc: Oral Oral    Resp: 16 18  18   Height:      Weight:   91.717 kg (202 lb 3.2 oz)   SpO2:    99%    Intake/Output Summary (Last 24 hours) at 05/07/12 1135 Last data filed at 05/07/12 0857  Gross per 24 hour  Intake    490 ml  Output      0 ml  Net    490 ml   Filed Weights   05/06/12 1444 05/06/12 2015 05/07/12 0636  Weight: 94.348 kg (208 lb) 92.7 kg (204 lb 5.9 oz) 91.717 kg (202 lb 3.2 oz)    Exam:   General:   middle aged old obese female in no acute distress  HEENT: Pallor present, moist oral mucosa, no icterus   Cardiovascular:  normal S1 and S2, no murmur   Respiratory:  her to auscultation bilateral   Abdomen:  Soft, NT, ND, BS+  EXTREMITIES: Warm, trace edema  CNS: AAOX 3  Data Reviewed: Basic Metabolic Panel:  Lab 05/07/12 8119 05/06/12 1406  NA 137 133*  K 3.5 3.4*  CL 98 96  CO2 29 24  GLUCOSE 244* 278*  BUN 8 10  CREATININE 0.63 0.52  CALCIUM 8.8 9.3  MG -- --  PHOS -- --  Liver Function Tests:  Lab 05/06/12 1406  AST 21  ALT 10  ALKPHOS 131*  BILITOT 0.4  PROT 7.6  ALBUMIN 3.5    Lab 05/06/12 1406  LIPASE 27  AMYLASE --   No results found for this basename: AMMONIA:5 in the last 168 hours CBC:  Lab 05/07/12 0550 05/06/12 1406  WBC 6.7 8.2  NEUTROABS -- 5.5  HGB 7.5* 5.6*  HCT 27.0* 22.6*  MCV 60.5* 56.9*  PLT 424* 470*   Cardiac Enzymes:  Lab 05/06/12 1512  CKTOTAL --  CKMB --  CKMBINDEX --  TROPONINI <0.30   BNP (last 3 results)  Basename 05/06/12 1512  PROBNP 568.3*   CBG:  Lab 05/07/12 0644 05/06/12 2352  05/06/12 1436  GLUCAP 223* 299* 268*    No results found for this or any previous visit (from the past 240 hour(s)).   Studies: Dg Chest 2 View  05/06/2012  *RADIOLOGY REPORT*  Clinical Data: Shortness of breath.  Chest pressure.  CHEST - 2 VIEW  Comparison: PA and lateral chest 10/03/2005 and 09/29/2006.  Findings: There is cardiomegaly and mild interstitial edema.  No consolidative process, pneumothorax or effusion.  IMPRESSION: Cardiomegaly and mild interstitial edema.   Original Report Authenticated By: Holley Dexter, M.D.     Scheduled Meds:   . furosemide  40 mg Intravenous Once  . furosemide  40 mg Intravenous BID  . hydrALAZINE  25 mg Oral Q8H  . insulin aspart  0-15 Units Subcutaneous TID WC  . lisinopril  20 mg Oral Daily  . potassium chloride  40 mEq Oral Daily  . sodium chloride  3 mL Intravenous Q12H  . sodium chloride  3 mL Intravenous Q12H   Continuous Infusions:     Time spent: 25 MINUTES    [name]  [facility] Pager [phone]. If 8PM-8AM, please contact night-coverage at [email or web address], password [facility] : [facility] 05/07/2012, 11:35 AM  LOS: 1 day

## 2012-05-08 ENCOUNTER — Encounter (HOSPITAL_COMMUNITY): Payer: Medicaid Other

## 2012-05-08 ENCOUNTER — Inpatient Hospital Stay (HOSPITAL_COMMUNITY): Payer: Medicaid Other

## 2012-05-08 DIAGNOSIS — E119 Type 2 diabetes mellitus without complications: Secondary | ICD-10-CM

## 2012-05-08 DIAGNOSIS — D500 Iron deficiency anemia secondary to blood loss (chronic): Secondary | ICD-10-CM | POA: Diagnosis present

## 2012-05-08 DIAGNOSIS — R079 Chest pain, unspecified: Secondary | ICD-10-CM

## 2012-05-08 LAB — CBC
MCV: 60.5 fL — ABNORMAL LOW (ref 78.0–100.0)
Platelets: 470 10*3/uL — ABNORMAL HIGH (ref 150–400)
RDW: 27.1 % — ABNORMAL HIGH (ref 11.5–15.5)
WBC: 7.8 10*3/uL (ref 4.0–10.5)

## 2012-05-08 LAB — BASIC METABOLIC PANEL
BUN: 17 mg/dL (ref 6–23)
CO2: 27 mEq/L (ref 19–32)
Calcium: 9.5 mg/dL (ref 8.4–10.5)
Creatinine, Ser: 0.76 mg/dL (ref 0.50–1.10)
Glucose, Bld: 195 mg/dL — ABNORMAL HIGH (ref 70–99)
Sodium: 136 mEq/L (ref 135–145)

## 2012-05-08 LAB — TYPE AND SCREEN
ABO/RH(D): B POS
Antibody Screen: NEGATIVE
Unit division: 0

## 2012-05-08 LAB — GLUCOSE, CAPILLARY: Glucose-Capillary: 220 mg/dL — ABNORMAL HIGH (ref 70–99)

## 2012-05-08 MED ORDER — FERROUS SULFATE 325 (65 FE) MG PO TABS: 325.0000 mg | ORAL_TABLET | Freq: Two times a day (BID) | ORAL | Status: DC

## 2012-05-08 MED ORDER — FERROUS SULFATE 325 (65 FE) MG PO TABS
325.0000 mg | ORAL_TABLET | Freq: Two times a day (BID) | ORAL | Status: DC
Start: 2012-05-08 — End: 2012-05-08
  Administered 2012-05-08: 325 mg via ORAL
  Filled 2012-05-08 (×2): qty 1

## 2012-05-08 MED ORDER — TECHNETIUM TC 99M SESTAMIBI GENERIC - CARDIOLITE
30.0000 | Freq: Once | INTRAVENOUS | Status: AC | PRN
  Administered 2012-05-08: 30 via INTRAVENOUS

## 2012-05-08 MED ORDER — CARVEDILOL 6.25 MG PO TABS: 6.2500 mg | ORAL_TABLET | Freq: Two times a day (BID) | ORAL | Status: DC

## 2012-05-08 MED ORDER — GLUCOSE BLOOD VI STRP: ORAL_STRIP | Status: DC

## 2012-05-08 MED ORDER — HYDRALAZINE HCL 25 MG PO TABS: 25.0000 mg | ORAL_TABLET | Freq: Three times a day (TID) | ORAL | Status: DC

## 2012-05-08 MED ORDER — REGADENOSON 0.4 MG/5ML IV SOLN
INTRAVENOUS | Status: AC
  Filled 2012-05-08: qty 5

## 2012-05-08 MED ORDER — POTASSIUM CHLORIDE CRYS ER 20 MEQ PO TBCR: 20.0000 meq | EXTENDED_RELEASE_TABLET | Freq: Every day | ORAL | Status: DC

## 2012-05-08 MED ORDER — METFORMIN HCL 1000 MG PO TABS: 1000.0000 mg | ORAL_TABLET | Freq: Two times a day (BID) | ORAL | Status: DC

## 2012-05-08 MED ORDER — GLIMEPIRIDE 4 MG PO TABS: 4.0000 mg | ORAL_TABLET | Freq: Every day | ORAL | Status: DC

## 2012-05-08 MED ORDER — RELION ALCOHOL SWABS PADS: 1.0000 | MEDICATED_PAD | Freq: Every day | Status: DC

## 2012-05-08 MED ORDER — LIVING WELL WITH DIABETES BOOK
Freq: Once | Status: AC
  Administered 2012-05-08: 13:00:00
  Filled 2012-05-08: qty 1

## 2012-05-08 MED ORDER — TECHNETIUM TC 99M SESTAMIBI GENERIC - CARDIOLITE
10.0000 | Freq: Once | INTRAVENOUS | Status: AC | PRN
  Administered 2012-05-08: 10 via INTRAVENOUS

## 2012-05-08 MED ORDER — LISINOPRIL 20 MG PO TABS: 20.0000 mg | ORAL_TABLET | Freq: Every day | ORAL | Status: DC

## 2012-05-08 MED ORDER — FUROSEMIDE 40 MG PO TABS: 40.0000 mg | ORAL_TABLET | Freq: Every day | ORAL | Status: DC

## 2012-05-08 MED ORDER — RELION LANCETS STANDARD 21G MISC: 1.0000 [IU] | Freq: Every day | Status: DC

## 2012-05-08 NOTE — Progress Notes (Signed)
"  Living well with diabetes" book given.  Decline any further instructions.  Says " I will read it"  Instructed to ask questions if she has any.  Pt verbalized understanding.  Amanda Pea, Charity fundraiser.

## 2012-05-08 NOTE — Progress Notes (Signed)
Discussed nuc results with Dr. Eden Emms. See his rounding note from today. He would like the patient to f/u with Dr. Jens Som in 4-6 weeks, appt made for 06/16/11 at 9:15am. The patient was also informed of her test results. Dayna Dunn PA-C

## 2012-05-08 NOTE — Progress Notes (Signed)
Patient ID: [name], female   DOB: 19-Oct-1965, 46 y.o.   MRN: [id number]    Subjective:  Denies SSCP, palpitations dyspnea improved    Objective:  Filed Vitals:   05/07/12 0956 05/07/12 1433 05/07/12 2147 05/08/12 0606  BP: 158/97 149/92 155/98 157/96  Pulse: 104 96 89 90  Temp:  98 F (36.7 C) 98.6 F (37 C) 98.8 F (37.1 C)  TempSrc:  Oral Oral Oral  Resp: 18 18 18 18   Height:      Weight:    203 lb 6.4 oz (92.262 kg)  SpO2: 99% 98% 98% 99%    Intake/Output from previous day:  Intake/Output Summary (Last 24 hours) at 05/08/12 0739 Last data filed at 05/08/12 0719  Gross per 24 hour  Intake    723 ml  Output    850 ml  Net   -127 ml    Physical Exam: Affect appropriate Healthy:  appears stated age HEENT: normal Neck supple with no adenopathy JVP normal no bruits no thyromegaly Lungs clear with no wheezing and good diaphragmatic motion Heart:  S1/S2 no murmur, no rub, gallop or click PMI normal Abdomen: benighn, BS positve, no tenderness, no AAA no bruit.  No HSM or HJR Distal pulses intact with no bruits No edema Neuro non-focal Skin warm and dry No muscular weakness   Lab Results: Basic Metabolic Panel:  Basename 05/08/12 0525 05/07/12 0550  NA 136 137  K 4.0 3.5  CL 97 98  CO2 27 29  GLUCOSE 195* 244*  BUN 17 8  CREATININE 0.76 0.63  CALCIUM 9.5 8.8  MG -- --  PHOS -- --   Liver Function Tests:  Basename 05/06/12 1406  AST 21  ALT 10  ALKPHOS 131*  BILITOT 0.4  PROT 7.6  ALBUMIN 3.5    Basename 05/06/12 1406  LIPASE 27  AMYLASE --   CBC:  Basename 05/07/12 0550 05/06/12 1406  WBC 6.7 8.2  NEUTROABS -- 5.5  HGB 7.5* 5.6*  HCT 27.0* 22.6*  MCV 60.5* 56.9*  PLT 424* 470*   Cardiac Enzymes:  Basename 05/07/12 1054 05/06/12 1512  CKTOTAL -- --  CKMB -- --  CKMBINDEX -- --  TROPONINI <0.30 <0.30   BNP: No components found with this basename: POCBNP:3 D-Dimer: No results found for this basename: DDIMER:2 in the  last 72 hours Hemoglobin A1C:  Basename 05/06/12 1917  HGBA1C 9.3*   Fasting Lipid Panel: No results found for this basename: CHOL,HDL,LDLCALC,TRIG,CHOLHDL,LDLDIRECT in the last 72 hours Thyroid Function Tests:  Basename 05/06/12 1645  TSH 0.825  T4TOTAL --  T3FREE --  THYROIDAB --   Anemia Panel:  Basename 05/06/12 1645  VITAMINB12 764  FOLATE 15.3  FERRITIN 3*  TIBC 533*  IRON 10*  RETICCTPCT 2.6    Imaging: Dg Chest 2 View  05/06/2012  *RADIOLOGY REPORT*  Clinical Data: Shortness of breath.  Chest pressure.  CHEST - 2 VIEW  Comparison: PA and lateral chest 10/03/2005 and 09/29/2006.  Findings: There is cardiomegaly and mild interstitial edema.  No consolidative process, pneumothorax or effusion.  IMPRESSION: Cardiomegaly and mild interstitial edema.   Original Report Authenticated By: Holley Dexter, M.D.     Cardiac Studies:  ECG:   ST rate 114 normal otherwise   Telemetry:  NSR 05/08/2012   Echo: EF 50-55%    Medications:     . carvedilol  6.25 mg Oral BID WC  . furosemide  40 mg Intravenous Once  . furosemide  40 mg  Oral Daily  . hydrALAZINE  25 mg Oral Q8H  . insulin aspart  0-15 Units Subcutaneous TID WC  . lisinopril  20 mg Oral Daily  . potassium chloride  40 mEq Oral Daily  . regadenoson  0.4 mg Intravenous Once  . sodium chloride  3 mL Intravenous Q12H  . sodium chloride  3 mL Intravenous Q12H       Assessment/Plan:  Chest Pain:  In setting of marked anemia and CXR with cardiomegaly and interstitial edema Echo EF 50-55%.  See consult note by Dr Johney Frame  For lexiscan myovue this am  Charlton Haws 05/08/2012, 7:39 AM

## 2012-05-08 NOTE — Progress Notes (Signed)
All d/c instructions given to pt by CN, Ronnie.  Amanda Pea, Charity fundraiser.

## 2012-05-08 NOTE — Progress Notes (Signed)
Brief Nutrition Note:  RD Consulted for education re: DM  RD provided DM diet education on 12/26. Please see note for details.   Clarene Duke RD, LDN Pager [phone] After Hours pager [phone]

## 2012-05-08 NOTE — Progress Notes (Signed)
Pt seen briefly in nuc. Lexiscan myoview completed. Await results. May need improved BP control at discretion of primary team who has been adjusting meds as BP running high this AM. Ronie Spies PA-C

## 2012-05-08 NOTE — Progress Notes (Signed)
Inpatient Diabetes Program Recommendations  AACE/ADA: New Consensus Statement on Inpatient Glycemic Control (2013)  Target Ranges:  Prepandial:   less than 140 mg/dL      Peak postprandial:   less than 180 mg/dL (1-2 hours)      Critically ill patients:  140 - 180 mg/dL   Reason for Visit: Fasting and post-prandial hyperglycemia  Inpatient Diabetes Program Recommendations Insulin - Basal: Recommend addition of basal insulin, as fasting glucose levels are high in 200's.  would start with 15 units (starting dose at 0.2 units/kg would total 18 units per day). Would benefit from taking at home as well. Once fastings are controlled, may need to add some Amaryl 4 mg./day to therapy to control post-prandial levels.  Note: Thank you, Lenor Coffin, RN, CNS, Diabetes Coordinator [phone])

## 2012-05-08 NOTE — Discharge Summary (Signed)
Physician Discharge Summary  [name]:[id number] DOB: 09-23-1965 DOA: 05/06/2012  PCP: Default, Provider, MD  Admit date: 05/06/2012 Discharge date: 05/08/2012  Time spent: 40 minutes  Recommendations for Outpatient Follow-up:  1. Home with outpatient follow up with cardiology . [facility] 2. Please monitor fsg daily and keep a log and bring it to the clinic on follow up 3.  please monitor BP every 3-4 days and keep a log of it as well.  4. Please call [facility] on 12/[facility] ( phone [phone]) to schedule appointment  Discharge Diagnoses:  Principal Problem:  *CHF, acute   Active Problems:  DIABETES MELLITUS, uncontrolled  Uncontrolled hypertension  Anemia, blood loss secondary to menorrhafgia  Hypokalemia  Shortness of breath   Discharge Condition: FAIR  Diet recommendation: diabetic/ cardiac  Filed Weights   05/06/12 2015 05/07/12 0636 05/08/12 0606  Weight: 92.7 kg (204 lb 5.9 oz) 91.717 kg (202 lb 3.2 oz) 92.262 kg (203 lb 6.4 oz)    History of present illness:  46 year old obese female with history of diabetes and hypertension (not on any medications 6 months when [facility]), fibroid uterus with heavy menstrual bleeding presented with shortness of breath for past 4 weeks worsened over last few days in the setting off new onset CHF with anemia and uncontrolled hypertension.   Hospital Course:  Acute CHF  Possibly in the setting off uncontrolled hypertension and anemia. Follow serial cardiac enzyme. Continue telemetry monitoring. Continue with IV Lasix 40 mg every 12 hours. 2-D echo showing EF of 50% with inferior wall akinesis.. Seen by cardiology and had myoview done today which did not show any ischemia but EF was found to be of 44%  -TSH normal  - nutrition consulted for counseling on adequate diet and exercise.  -patient will be discharged on lisinopril and coreg. Lipid panel wnl.  Can start her on baby ASA once anemia is stable.  -follow up with cardiology as outpatient  Anemia  Likely in the setting off severe menorrhagia secondary to fibroid uterus with CBC showing microcytic picture.  -Iron panel shows irondeficiency. Hemoglobin improved with 2 units PRBC. Will discharge on iron supplements.  -she will [facility] early next week for appointment.  Uncontrolled hypertension  Patient has not taken any blood pressure medications for past 6 months.  Will be discharged on daily lasix, lisinopril, coreg and hydralazine. Her BP is just sable on all these medications and reinforced the importance of medication compliance. Advised on checking her BP at least twice weekly and keep record of it.  Diabetes mellitus  Patient has not taken any medications for past 6 months and was previously on metformin.hemoglobin A1c level of 9.3 . She would be needing insulin for adequate diabetic control but refuses it at this time and wants to try oral medications with lifestyle modifications. i will place her back on metformin 100 mg bid and amaryl 4mg  daily. [facility] Will prescribe glucometer and test strips.  Patient counseled on diet and exercise.    Diet: Diabetic/ cardiac   Code Status: Full code   Consultants:  [facility]   Procedures:  None  Antibiotics:  None      Discharge Exam: Filed Vitals:   05/08/12 0944 05/08/12 1107 05/08/12 1231 05/08/12 1340  BP: 182/97 137/84 154/94 143/73  Pulse: 104  86 88  Temp:    98.5 F (36.9  C)  TempSrc:    Oral  Resp:    19  Height:      Weight:      SpO2:    98%    General: middle aged old obese female in no acute distress  HEENT: Pallor present, moist oral mucosa, no icterus  Cardiovascular: normal S1 and S2, no murmur  Respiratory: her to auscultation bilateral  Abdomen: Soft, NT, ND, BS+  EXTREMITIES: Warm, trace edema  CNS: AAOX 3   Discharge  Instructions  Discharge Orders    Future Appointments: Provider: Department: Dept Phone: Center:   06/15/2012 9:15 AM [name] Bunting, MD [facility] Main Office [place]) [phone] [id number]     Future Orders Please Complete By Expires   For home use only DME Glucometer          Medication List     As of 05/08/2012  2:47 PM    STOP taking these medications         TRIBENZOR 40-10-25 MG Tabs   Generic drug: Olmesartan-Amlodipine-HCTZ      TAKE these medications         carvedilol 6.25 MG tablet   Commonly known as: COREG   Take 1 tablet (6.25 mg total) by mouth 2 (two) times daily with a meal.      ferrous sulfate 325 (65 FE) MG tablet   Take 1 tablet (325 mg total) by mouth 2 (two) times daily with a meal.      furosemide 40 MG tablet   Commonly known as: LASIX   Take 1 tablet (40 mg total) by mouth daily.      glimepiride 4 MG tablet   Commonly known as: AMARYL   Take 1 tablet (4 mg total) by mouth daily before breakfast.      glucose blood test strip   Use as instructed      hydrALAZINE 25 MG tablet   Commonly known as: APRESOLINE   Take 1 tablet (25 mg total) by mouth 3 (three) times daily.      lisinopril 20 MG tablet   Commonly known as: PRINIVIL,ZESTRIL   Take 1 tablet (20 mg total) by mouth daily.      metFORMIN 1000 MG tablet   Commonly known as: GLUCOPHAGE   Take 1 tablet (1,000 mg total) by mouth 2 (two) times daily at 10 AM and 5 PM.      ReliOn Alcohol Swabs Pads   1 packet by Does not apply route daily.      RELION LANCETS STANDARD 21G Misc   1 Units by Does not apply route daily.           Follow-up Information    Follow up with [name] Millers, MD. (06/16/11 at 9:15am)    Contact information:   [address] [place], [address] [place] [address] [phone]       Follow up with [facility].          The results of significant diagnostics from this hospitalization (including imaging, microbiology, ancillary and  laboratory) are listed below for reference.    Significant Diagnostic Studies: Dg Chest 2 View  05/06/2012  *RADIOLOGY REPORT*  Clinical Data: Shortness of breath.  Chest pressure.  CHEST - 2 VIEW  Comparison: PA and lateral chest 10/03/2005 and 09/29/2006.  Findings: There is cardiomegaly and mild interstitial edema.  No consolidative process, pneumothorax or effusion.  IMPRESSION: Cardiomegaly and mild interstitial edema.   Original Report Authenticated By: Holley Dexter, M.D.  Nm Myocar Multi W/spect W/wall Motion / Ef  05/08/2012  *RADIOLOGY REPORT*  Clinical data: Chest pain, diabetes, hypertension, shortness of breath.  NUCLEAR MEDICINE MYOCARDIAL PERFUSION IMAGING NUCLEAR MEDICINE LEFT VENTRICULAR WALL MOTION ANALYSIS NUCLEAR MEDICINE LEFT VENTRICULAR EJECTION FRACTION CALCULATION  Technique: Standard single day myocardial SPECT imaging was performed after resting intravenous injection of Tc-72m Myoview . After intravenous infusion of Lexiscan (regadenoson) under supervision of cardiology staff, Myoview was injected intravenously and standard myocardial SPECT imaging was performed. Quantitative gated imaging was also performed to evaluate left ventricular wall motion and estimate left ventricular ejection fraction.  Radiopharmaceutical: 10+30 mCi Tc4m Myoview IV.  Comparison: None available  Findings:    The stress SPECT images demonstrate mild left ventricular enlargement with mildly decreased apical activity, otherwise physiologic distribution of radiopharmaceutical. Rest images demonstrate little change in the mildly decreased apical activity, no new perfusion defects. The gated stress SPECT images demonstrate normal left ventricular myocardial thickening.  No focal wall motion abnormality is seen. Calculated left ventricular end-diastolic volume , end-systolic volume 97ml, ejection fraction of 44%.  IMPRESSION:  1. Negative for pharmacologic-stress induced ischemia.  2. Left  ventricular ejection fraction 44%. 3.  Mild apical thinning.   Original Report Authenticated By: D. Andria Rhein, MD     Microbiology: No results found for this or any previous visit (from the past 240 hour(s)).   Labs: Basic Metabolic Panel:  Lab 05/08/12 1610 05/07/12 0550 05/06/12 1406  NA 136 137 133*  K 4.0 3.5 3.4*  CL 97 98 96  CO2 27 29 24   GLUCOSE 195* 244* 278*  BUN 17 8 10   CREATININE 0.76 0.63 0.52  CALCIUM 9.5 8.8 9.3  MG -- -- --  PHOS -- -- --   Liver Function Tests:  Lab 05/06/12 1406  AST 21  ALT 10  ALKPHOS 131*  BILITOT 0.4  PROT 7.6  ALBUMIN 3.5    Lab 05/06/12 1406  LIPASE 27  AMYLASE --   No results found for this basename: AMMONIA:5 in the last 168 hours CBC:  Lab 05/08/12 1102 05/07/12 0550 05/06/12 1406  WBC 7.8 6.7 8.2  NEUTROABS -- -- 5.5  HGB 8.2* 7.5* 5.6*  HCT 29.6* 27.0* 22.6*  MCV 60.5* 60.5* 56.9*  PLT 470* 424* 470*   Cardiac Enzymes:  Lab 05/07/12 1054 05/06/12 1512  CKTOTAL -- --  CKMB -- --  CKMBINDEX -- --  TROPONINI <0.30 <0.30   BNP: BNP (last 3 results)  Basename 05/06/12 1512  PROBNP 568.3*   CBG:  Lab 05/08/12 1110 05/08/12 0602 05/07/12 2250 05/07/12 1649 05/07/12 1138  GLUCAP 270* 220* 276* 180* 236*       Signed:  Kiasha Bellin  [facility] 05/08/2012, 2:47 PM

## 2012-05-12 NOTE — Progress Notes (Signed)
Utilization Review Completed.   Gianni Fuchs, RN, BSN Nurse Case Manager  [phone]  

## 2012-05-18 ENCOUNTER — Encounter (HOSPITAL_COMMUNITY): Payer: Self-pay

## 2012-05-18 ENCOUNTER — Emergency Department (INDEPENDENT_AMBULATORY_CARE_PROVIDER_SITE_OTHER)
Admission: EM | Admit: 2012-05-18 | Discharge: 2012-05-18 | Disposition: A | Discharge status: Home / Self Care | Payer: Medicaid Other | Source: Home / Self Care

## 2012-05-18 DIAGNOSIS — D649 Anemia, unspecified: Secondary | ICD-10-CM

## 2012-05-18 DIAGNOSIS — I10 Essential (primary) hypertension: Secondary | ICD-10-CM

## 2012-05-18 DIAGNOSIS — I509 Heart failure, unspecified: Secondary | ICD-10-CM

## 2012-05-18 DIAGNOSIS — D500 Iron deficiency anemia secondary to blood loss (chronic): Secondary | ICD-10-CM

## 2012-05-18 DIAGNOSIS — Z23 Encounter for immunization: Secondary | ICD-10-CM

## 2012-05-18 LAB — COMPREHENSIVE METABOLIC PANEL
ALT: 7 U/L (ref 0–35)
Alkaline Phosphatase: 117 U/L (ref 39–117)
Chloride: 100 mEq/L (ref 96–112)
GFR calc Af Amer: >90 mL/min (ref >90)
Glucose, Bld: 88 mg/dL (ref 70–99)
Potassium: 3.8 mEq/L (ref 3.5–5.1)
Sodium: 138 mEq/L (ref 135–145)
Total Bilirubin: 0.2 mg/dL — ABNORMAL LOW (ref 0.3–1.2)
Total Protein: 8.9 g/dL — ABNORMAL HIGH (ref 6.0–8.3)

## 2012-05-18 LAB — CBC
HCT: 33.7 % — ABNORMAL LOW (ref 36.0–46.0)
MCHC: 27.6 g/dL — ABNORMAL LOW (ref 30.0–36.0)
MCV: 64.4 fL — ABNORMAL LOW (ref 78.0–100.0)
Platelets: 361 10*3/uL (ref 150–400)
RDW: 29.3 % — ABNORMAL HIGH (ref 11.5–15.5)

## 2012-05-18 LAB — GLUCOSE, CAPILLARY: Glucose-Capillary: 86 mg/dL (ref 70–99)

## 2012-05-18 MED ORDER — GLIMEPIRIDE 4 MG PO TABS: 2.0000 mg | ORAL_TABLET | Freq: Every day | ORAL | Status: DC

## 2012-05-18 MED ORDER — INFLUENZA VIRUS VACC SPLIT PF IM SUSP
0.5000 mL | Freq: Once | INTRAMUSCULAR | Status: AC
  Administered 2012-05-18: 0.5 mL via INTRAMUSCULAR

## 2012-05-18 NOTE — ED Provider Notes (Signed)
History     CSN: [id number]  Arrival date & time 05/18/12  1542   Chief Complaint  Patient presents with  . Follow-up   HPI Pt says she feels much better but still her BP is elevated, pt is not seen her Gynecologist but still having heavy periods. Pt says that she was having low blood sugars with amaryl and cut it in half.  She is not able to check her BS because of not having money to get the medications.  Pt says she could not get full month supply of her medications because she cannot afford them.  She says she is almost out of her hydralazine because of not having enough money.  Pt denies chest pain and reports that her SOB is better . She is not having any cramping in the extremities hands or feet.    Past Medical History  Diagnosis Date  . Anemia   . Hypertension   . Diabetes mellitus   . Shortness of breath   . Pneumonia   . Heart murmur   . CHF (congestive heart failure)     Past Surgical History  Procedure Date  . Cesarean section     Family History  Problem Relation Age of Onset  . Hypertension Mother   . Hypertension Brother     History  Substance Use Topics  . Smoking status: Never Smoker   . Smokeless tobacco: Never Used  . Alcohol Use: Yes     Comment: occasional    OB History    Grav Para Term Preterm Abortions TAB SAB Ect Mult Living                  Review of Systems  Constitutional: Negative.   HENT: Negative.   Eyes: Negative.   Respiratory: Negative.   Cardiovascular: Negative.   Gastrointestinal: Negative.   Genitourinary: Positive for vaginal bleeding.  Musculoskeletal: Negative.   Neurological: Negative.   Hematological: Negative.   Psychiatric/Behavioral: Negative.     Allergies  Review of patient's allergies indicates no known allergies.  Home Medications   Current Outpatient Rx  Name  Route  Sig  Dispense  Refill  . CARVEDILOL 6.25 MG PO TABS   Oral   Take 1 tablet (6.25 mg total) by mouth 2 (two) times daily with a  meal.   60 tablet   3   . FERROUS SULFATE 325 (65 FE) MG PO TABS   Oral   Take 1 tablet (325 mg total) by mouth 2 (two) times daily with a meal.   60 tablet   3   . FUROSEMIDE 40 MG PO TABS   Oral   Take 1 tablet (40 mg total) by mouth daily.   30 tablet   3   . GLIMEPIRIDE 4 MG PO TABS   Oral   Take 1 tablet (4 mg total) by mouth daily before breakfast.   30 tablet   3   . GLUCOSE BLOOD VI STRP      Use as instructed   100 each   12   . HYDRALAZINE HCL 25 MG PO TABS   Oral   Take 1 tablet (25 mg total) by mouth 3 (three) times daily.   90 tablet   3   . LISINOPRIL 20 MG PO TABS   Oral   Take 1 tablet (20 mg total) by mouth daily.   30 tablet   3   . METFORMIN HCL 1000 MG PO TABS   Oral  Take 1 tablet (1,000 mg total) by mouth 2 (two) times daily at 10 AM and 5 PM.   60 tablet   3   . RELION ALCOHOL SWABS PADS   Does not apply   1 packet by Does not apply route daily.   1 each   3   . RELION LANCETS STANDARD 21G MISC   Does not apply   1 Units by Does not apply route daily.   200 each   3    BP 155/96  Pulse 82  Temp 98.7 F (37.1 C) (Oral)  Resp 19  SpO2 100%  LMP 04/21/2012  Physical Exam  Nursing note and vitals reviewed. Constitutional: She is oriented to person, place, and time. She appears well-developed and well-nourished. No distress.  HENT:  Head: Normocephalic and atraumatic.  Eyes: EOM are normal. Pupils are equal, round, and reactive to light.  Neck: Normal range of motion. Neck supple.  Cardiovascular: Normal rate, regular rhythm and normal heart sounds.   Pulmonary/Chest: Effort normal and breath sounds normal.  Abdominal: Soft. Bowel sounds are normal.  Musculoskeletal: Normal range of motion. She exhibits edema. She exhibits no tenderness.  Neurological: She is alert and oriented to person, place, and time.  Skin: Skin is warm and dry. No rash noted. No erythema.  Psychiatric: She has a normal mood and affect. Her  behavior is normal. Judgment and thought content normal.      ED Course  Procedures (including critical care time)  Labs Reviewed  COMPREHENSIVE METABOLIC PANEL  CBC   No results found.  No diagnosis found.  MDM  IMPRESSION  HTN  CHF  Type 2 diabetes mellitus  ANEMIA  MENORRHAGIA  UTERINE FIBROIDS  RECOMMENDATIONS / PLAN INFLUENZA VACCINE Given Today CHECK CBC, CMP TODAY DECREASED GLIMEPIRIDE TO 2 MG DAILY CHECK BS DAILY FOLLOW UP WITH CARDIOLOGIST IN EARLY FEB AS SCHEDULED FOLLOW UP WITH GYNECOLOGIST TO EVAL MENORRHAGIA  FOLLOW UP 1 MONTH  The patient was given clear instructions to go to ER or return to medical center if symptoms don't improve, worsen or new problems develop.  The patient verbalized understanding.  The patient was told to call to get lab results if they haven't heard anything in the next week.            Cleora Fleet, MD 05/18/12 1701

## 2012-05-18 NOTE — ED Notes (Signed)
Follow up-recent hospitalization 12/25 discharged 12/27 was admitted with SOB

## 2012-05-21 ENCOUNTER — Telehealth (HOSPITAL_COMMUNITY): Payer: Self-pay

## 2012-06-15 ENCOUNTER — Encounter: Payer: Self-pay | Admitting: Cardiology

## 2012-06-15 NOTE — Progress Notes (Signed)
   HPI: 47 year old female for followup of congestive heart failure. Patient admitted in December of 2013 with severe anemia. Echocardiogram in December of 2013 showed akinesis of the mid and basal inferior wall. Ejection fraction was 50%. There was moderate left atrial enlargement and mild mitral regurgitation. Myoview in December of 2013 showed an ejection fraction of 44%. There was mild apical thinning but no ischemia. The patient improved with transfusion and diuresis. Since discharge,   Current Outpatient Prescriptions  Medication Sig Dispense Refill  . carvedilol (COREG) 6.25 MG tablet Take 1 tablet (6.25 mg total) by mouth 2 (two) times daily with a meal.  60 tablet  3  . ferrous sulfate 325 (65 FE) MG tablet Take 1 tablet (325 mg total) by mouth 2 (two) times daily with a meal.  60 tablet  3  . furosemide (LASIX) 40 MG tablet Take 1 tablet (40 mg total) by mouth daily.  30 tablet  3  . glimepiride (AMARYL) 4 MG tablet Take 0.5 tablets (2 mg total) by mouth daily before breakfast.  30 tablet  3  . glucose blood (RELION GLUCOSE TEST STRIPS) test strip Use as instructed  100 each  12  . hydrALAZINE (APRESOLINE) 25 MG tablet Take 1 tablet (25 mg total) by mouth 3 (three) times daily.  90 tablet  3  . lisinopril (PRINIVIL,ZESTRIL) 20 MG tablet Take 1 tablet (20 mg total) by mouth daily.  30 tablet  3  . metFORMIN (GLUCOPHAGE) 1000 MG tablet Take 1 tablet (1,000 mg total) by mouth 2 (two) times daily at 10 AM and 5 PM.  60 tablet  3  . ReliOn Alcohol Swabs PADS 1 packet by Does not apply route daily.  1 each  3  . RELION LANCETS STANDARD 21G MISC 1 Units by Does not apply route daily.  200 each  3     Past Medical History  Diagnosis Date  . Anemia   . Hypertension   . Diabetes mellitus   . Pneumonia   . Heart murmur   . CHF (congestive heart failure)     Past Surgical History  Procedure Date  . Cesarean section     History   Social History  . Marital Status: Legally Separated    Spouse Name: N/A    Number of Children: N/A  . Years of Education: N/A   Occupational History  . unemployed    Social History Main Topics  . Smoking status: Never Smoker   . Smokeless tobacco: Never Used  . Alcohol Use: Yes     Comment: occasional  . Drug Use: No  . Sexually Active: Yes   Other Topics Concern  . Not on file   Social History Narrative   Lives with her 4 children.    ROS: no fevers or chills, productive cough, hemoptysis, dysphasia, odynophagia, melena, hematochezia, dysuria, hematuria, rash, seizure activity, orthopnea, PND, pedal edema, claudication. Remaining systems are negative.  Physical Exam: Well-developed well-nourished in no acute distress.  Skin is warm and dry.  HEENT is normal.  Neck is supple.  Chest is clear to auscultation with normal expansion.  Cardiovascular exam is regular rate and rhythm.  Abdominal exam nontender or distended. No masses palpated. Extremities show no edema. neuro grossly intact  ECG     This encounter was created in error - please disregard.

## 2012-06-18 ENCOUNTER — Emergency Department (INDEPENDENT_AMBULATORY_CARE_PROVIDER_SITE_OTHER)
Admission: EM | Admit: 2012-06-18 | Discharge: 2012-06-18 | Disposition: A | Discharge status: Home / Self Care | Payer: Medicaid Other | Source: Home / Self Care | Attending: Family Medicine | Admitting: Family Medicine

## 2012-06-18 ENCOUNTER — Encounter (HOSPITAL_COMMUNITY): Payer: Self-pay

## 2012-06-18 DIAGNOSIS — I10 Essential (primary) hypertension: Secondary | ICD-10-CM

## 2012-06-18 DIAGNOSIS — I509 Heart failure, unspecified: Secondary | ICD-10-CM

## 2012-06-18 DIAGNOSIS — R0602 Shortness of breath: Secondary | ICD-10-CM

## 2012-06-18 DIAGNOSIS — D500 Iron deficiency anemia secondary to blood loss (chronic): Secondary | ICD-10-CM

## 2012-06-18 DIAGNOSIS — E119 Type 2 diabetes mellitus without complications: Secondary | ICD-10-CM

## 2012-06-18 NOTE — ED Provider Notes (Signed)
History   CSN: [id number]  Arrival date & time 06/18/12  1557   First MD Initiated Contact with Patient 06/18/12 1625     Chief Complaint  Patient presents with  . Follow-up   HPI Pt says she is feeling much better.  She is working again.  She is going to get a blood glucose meter.   Past Medical History  Diagnosis Date  . Anemia   . Hypertension   . Diabetes mellitus   . Pneumonia   . Heart murmur   . CHF (congestive heart failure)     Past Surgical History  Procedure Date  . Cesarean section     Family History  Problem Relation Age of Onset  . Hypertension Mother   . Hypertension Brother     History  Substance Use Topics  . Smoking status: Never Smoker   . Smokeless tobacco: Never Used  . Alcohol Use: Yes     Comment: occasional    OB History    Grav Para Term Preterm Abortions TAB SAB Ect Mult Living                 Review of Systems  Constitutional: Negative.   HENT: Negative.   Respiratory: Negative.   Cardiovascular: Negative.   Genitourinary: Positive for frequency.  Neurological: Negative.   Hematological: Negative.   Psychiatric/Behavioral: Negative.   All other systems reviewed and are negative.    Allergies  Review of patient's allergies indicates no known allergies.  Home Medications   Current Outpatient Rx  Name  Route  Sig  Dispense  Refill  . CARVEDILOL 6.25 MG PO TABS   Oral   Take 1 tablet (6.25 mg total) by mouth 2 (two) times daily with a meal.   60 tablet   3   . FERROUS SULFATE 325 (65 FE) MG PO TABS   Oral   Take 1 tablet (325 mg total) by mouth 2 (two) times daily with a meal.   60 tablet   3   . FUROSEMIDE 40 MG PO TABS   Oral   Take 1 tablet (40 mg total) by mouth daily.   30 tablet   3   . GLIMEPIRIDE 4 MG PO TABS   Oral   Take 0.5 tablets (2 mg total) by mouth daily before breakfast.   30 tablet   3   . GLUCOSE BLOOD VI STRP      Use as instructed   100 each   12   . HYDRALAZINE HCL 25 MG PO  TABS   Oral   Take 1 tablet (25 mg total) by mouth 3 (three) times daily.   90 tablet   3   . LISINOPRIL 20 MG PO TABS   Oral   Take 1 tablet (20 mg total) by mouth daily.   30 tablet   3   . METFORMIN HCL 1000 MG PO TABS   Oral   Take 1 tablet (1,000 mg total) by mouth 2 (two) times daily at 10 AM and 5 PM.   60 tablet   3   . RELION ALCOHOL SWABS PADS   Does not apply   1 packet by Does not apply route daily.   1 each   3   . RELION LANCETS STANDARD 21G MISC   Does not apply   1 Units by Does not apply route daily.   200 each   3     BP 147/96  Pulse 82  Temp  98.2 F (36.8 C) (Oral)  Resp 17  SpO2 99%  Physical Exam  Nursing note and vitals reviewed. Constitutional: She is oriented to person, place, and time. She appears well-developed and well-nourished. No distress.  HENT:  Head: Normocephalic and atraumatic.  Eyes: Conjunctivae normal and EOM are normal. Pupils are equal, round, and reactive to light.  Neck: Normal range of motion. Neck supple.  Cardiovascular: Normal rate, regular rhythm and normal heart sounds.   Pulmonary/Chest: Effort normal and breath sounds normal.  Abdominal: Soft. Bowel sounds are normal.  Musculoskeletal: Normal range of motion. She exhibits no edema and no tenderness.  Neurological: She is alert and oriented to person, place, and time.  Skin: Skin is warm and dry.  Psychiatric: She has a normal mood and affect. Her behavior is normal. Judgment and thought content normal.    ED Course  Procedures (including critical care time)  Labs Reviewed - No data to display No results found.  No diagnosis found.  MDM  IMPRESSION  Hypertension  DM type 2   RECOMMENDATIONS / PLAN Pt not having any more low blood sugars now that she has cut back on the glimepiride.   Blood pressure much better controlled now  FOLLOW UP 3 months   The patient was given clear instructions to go to ER or return to medical center if symptoms  don't improve, worsen or new problems develop.  The patient verbalized understanding.  The patient was told to call to get lab results if they haven't heard anything in the next week.            Cleora Fleet, MD 06/18/12 2049

## 2012-06-18 NOTE — ED Notes (Signed)
Follow up- was hospitalized last month with high blood pressure

## 2012-08-28 ENCOUNTER — Encounter: Payer: Self-pay | Admitting: Cardiology

## 2012-09-09 ENCOUNTER — Other Ambulatory Visit: Payer: Self-pay | Admitting: Internal Medicine

## 2012-09-13 ENCOUNTER — Other Ambulatory Visit: Payer: Self-pay | Admitting: Internal Medicine

## 2012-09-17 ENCOUNTER — Emergency Department (INDEPENDENT_AMBULATORY_CARE_PROVIDER_SITE_OTHER)
Admission: EM | Admit: 2012-09-17 | Discharge: 2012-09-17 | Disposition: A | Discharge status: Home / Self Care | Payer: Medicaid Other | Source: Home / Self Care

## 2012-09-17 ENCOUNTER — Encounter (HOSPITAL_COMMUNITY): Payer: Self-pay

## 2012-09-17 DIAGNOSIS — I10 Essential (primary) hypertension: Secondary | ICD-10-CM

## 2012-09-17 DIAGNOSIS — D649 Anemia, unspecified: Secondary | ICD-10-CM

## 2012-09-17 DIAGNOSIS — E119 Type 2 diabetes mellitus without complications: Secondary | ICD-10-CM

## 2012-09-17 DIAGNOSIS — R079 Chest pain, unspecified: Secondary | ICD-10-CM

## 2012-09-17 LAB — CBC
MCH: 19.4 pg — ABNORMAL LOW (ref 26.0–34.0)
Platelets: 423 10*3/uL — ABNORMAL HIGH (ref 150–400)
RBC: 4.7 MIL/uL (ref 3.87–5.11)
RDW: 18.2 % — ABNORMAL HIGH (ref 11.5–15.5)

## 2012-09-17 LAB — COMPREHENSIVE METABOLIC PANEL
ALT: 8 U/L (ref 0–35)
AST: 23 U/L (ref 0–37)
Albumin: 3.5 g/dL (ref 3.5–5.2)
CO2: 27 mEq/L (ref 19–32)
Calcium: 9.9 mg/dL (ref 8.4–10.5)
Creatinine, Ser: 0.72 mg/dL (ref 0.50–1.10)
Sodium: 139 mEq/L (ref 135–145)
Total Protein: 7.7 g/dL (ref 6.0–8.3)

## 2012-09-17 MED ORDER — FUROSEMIDE 40 MG PO TABS: 40.0000 mg | ORAL_TABLET | Freq: Every day | ORAL | Status: DC

## 2012-09-17 MED ORDER — CARVEDILOL 6.25 MG PO TABS: 12.5000 mg | ORAL_TABLET | Freq: Two times a day (BID) | ORAL | Status: DC

## 2012-09-17 MED ORDER — LISINOPRIL 20 MG PO TABS: 20.0000 mg | ORAL_TABLET | Freq: Every day | ORAL | Status: DC

## 2012-09-17 MED ORDER — HYDRALAZINE HCL 25 MG PO TABS: 50.0000 mg | ORAL_TABLET | Freq: Three times a day (TID) | ORAL | Status: DC

## 2012-09-17 MED ORDER — METFORMIN HCL 1000 MG PO TABS: 1000.0000 mg | ORAL_TABLET | Freq: Two times a day (BID) | ORAL | Status: DC

## 2012-09-17 MED ORDER — GLIMEPIRIDE 4 MG PO TABS: 2.0000 mg | ORAL_TABLET | Freq: Every day | ORAL | Status: DC

## 2012-09-17 NOTE — ED Notes (Signed)
Medication refill Patient has history of hypertension

## 2012-09-17 NOTE — ED Provider Notes (Signed)
History     CSN: [id number]  Arrival date & time 09/17/12  1725   None     Chief Complaint  Patient presents with  . Medication Refill   47 year old female who is here for followup of her blood pressure as well as a well visit. The patient states that she continues to have heavy menstrual periods but is unable to have a fibroid or because her blood pressure is uncontrolled. The patient hasn't cutting down on the dose of her Coreg and hydralazine to last longer she ran her off her lisinopril last week and her blood pressures in the 170 systolic today. She denies any chest pain any shortness of breath but does have some dependent edema  HPI  Past Medical History  Diagnosis Date  . Anemia   . Hypertension   . Diabetes mellitus   . Pneumonia   . Heart murmur   . CHF (congestive heart failure)     Past Surgical History  Procedure Laterality Date  . Cesarean section      Family History  Problem Relation Age of Onset  . Hypertension Mother   . Hypertension Brother     History  Substance Use Topics  . Smoking status: Never Smoker   . Smokeless tobacco: Never Used  . Alcohol Use: Yes     Comment: occasional    OB History   Grav Para Term Preterm Abortions TAB SAB Ect Mult Living                  Review of Systems Eyes: Negative.  Respiratory: Negative.  Cardiovascular: Negative.  Gastrointestinal: Negative.  Genitourinary: Positive for vaginal bleeding.  Musculoskeletal: Negative.  Neurological: Negative.  Hematological: Negative.  Psychiatric/Behavioral: Negative.     Allergies  Review of patient's allergies indicates no known allergies.  Home Medications   Current Outpatient Rx  Name  Route  Sig  Dispense  Refill  . carvedilol (COREG) 6.25 MG tablet   Oral   Take 2 tablets (12.5 mg total) by mouth 2 (two) times daily with a meal.   60 tablet   3   . ferrous sulfate 325 (65 FE) MG tablet   Oral   Take 1 tablet (325 mg total) by mouth 2 (two) times  daily with a meal.   60 tablet   3   . furosemide (LASIX) 40 MG tablet   Oral   Take 1 tablet (40 mg total) by mouth daily.   30 tablet   3   . glimepiride (AMARYL) 4 MG tablet   Oral   Take 0.5 tablets (2 mg total) by mouth daily before breakfast.   30 tablet   3   . glucose blood (RELION GLUCOSE TEST STRIPS) test strip      Use as instructed   100 each   12   . hydrALAZINE (APRESOLINE) 25 MG tablet   Oral   Take 2 tablets (50 mg total) by mouth 3 (three) times daily.   90 tablet   3   . lisinopril (PRINIVIL,ZESTRIL) 20 MG tablet   Oral   Take 1 tablet (20 mg total) by mouth daily.   30 tablet   3   . metFORMIN (GLUCOPHAGE) 1000 MG tablet   Oral   Take 1 tablet (1,000 mg total) by mouth 2 (two) times daily at 10 AM and 5 PM.   60 tablet   3   . ReliOn Alcohol Swabs PADS   Does not apply  1 packet by Does not apply route daily.   1 each   3   . RELION LANCETS STANDARD 21G MISC   Does not apply   1 Units by Does not apply route daily.   200 each   3     BP 173/98  Pulse 95  Temp(Src) 97.5 F (36.4 C) (Oral)  Resp 16  SpO2 98%  Physical Exam Constitutional: She is oriented to person, place, and time. She appears well-developed and well-nourished. No distress.  HENT:  Head: Normocephalic and atraumatic.  Eyes: EOM are normal. Pupils are equal, round, and reactive to light.  Neck: Normal range of motion. Neck supple.  Cardiovascular: Normal rate, regular rhythm and normal heart sounds.  Pulmonary/Chest: Effort normal and breath sounds normal.  Abdominal: Soft. Bowel sounds are normal.  Musculoskeletal: Normal range of motion. She exhibits edema. She exhibits no tenderness.  Neurological: She is alert and oriented to person, place, and time.  Skin: Skin is warm and dry. No rash noted. No erythema.  Psychiatric: She has a normal mood and affect. Her behavior is normal. Judgment and thought content normal.     ED Course  Procedures (including  critical care time)  Labs Reviewed  CBC  COMPREHENSIVE METABOLIC PANEL   No results found.   1. Chest pain none today.   2. Uncontrolled hypertension Coreg increased to 12.5 because of tachycardia and elevated blood pressure hydralazine increased to 50 mg 3 times a day, refilled all of her medications   3. ANEMIA B12 CBC today   4. DIABETES MELLITUS , stable HEENT her hemoglobin A1c today last hemoglobin A1c was in December 2013 and was 9.3    Followup in 2 weeks for blood pressure check   MDM           Richarda Overlie, MD 09/17/12 1757

## 2012-09-18 LAB — HEMOGLOBIN A1C: Mean Plasma Glucose: 143 mg/dL — ABNORMAL HIGH (ref <117)

## 2012-11-18 ENCOUNTER — Ambulatory Visit: Payer: Self-pay | Admitting: Obstetrics

## 2012-12-08 ENCOUNTER — Encounter: Payer: Self-pay | Admitting: Internal Medicine

## 2012-12-08 ENCOUNTER — Ambulatory Visit: Payer: Medicaid Other | Attending: Family Medicine | Admitting: Internal Medicine

## 2012-12-08 VITALS — BP 163/106 | HR 88 | Temp 98.6°F | Ht 64.0 in | Wt 213.4 lb

## 2012-12-08 DIAGNOSIS — D509 Iron deficiency anemia, unspecified: Secondary | ICD-10-CM

## 2012-12-08 DIAGNOSIS — I10 Essential (primary) hypertension: Secondary | ICD-10-CM

## 2012-12-08 DIAGNOSIS — D649 Anemia, unspecified: Secondary | ICD-10-CM | POA: Insufficient documentation

## 2012-12-08 DIAGNOSIS — E119 Type 2 diabetes mellitus without complications: Secondary | ICD-10-CM | POA: Insufficient documentation

## 2012-12-08 MED ORDER — FERROUS SULFATE 325 (65 FE) MG PO TABS: 325.0000 mg | ORAL_TABLET | Freq: Two times a day (BID) | ORAL | Status: DC

## 2012-12-08 MED ORDER — HYDRALAZINE HCL 25 MG PO TABS: 100.0000 mg | ORAL_TABLET | Freq: Three times a day (TID) | ORAL | Status: DC

## 2012-12-08 MED ORDER — METFORMIN HCL 1000 MG PO TABS: 1000.0000 mg | ORAL_TABLET | Freq: Two times a day (BID) | ORAL | Status: DC

## 2012-12-08 MED ORDER — GLIMEPIRIDE 4 MG PO TABS: 2.0000 mg | ORAL_TABLET | Freq: Every day | ORAL | Status: DC

## 2012-12-08 MED ORDER — LISINOPRIL-HYDROCHLOROTHIAZIDE 20-12.5 MG PO TABS: 1.0000 | ORAL_TABLET | Freq: Two times a day (BID) | ORAL | Status: DC

## 2012-12-08 NOTE — Progress Notes (Signed)
Patient ID: [name], female   DOB: 1965/06/11, 47 y.o.   MRN: [id number]  [name]:[id number] DOB: 23-Oct-1965 DOA: (Not on file)   PCP: Standley Dakins, MD   Chief Complaint:  Follow up and medication refill  HPI:  47 year old female with history of uncontrolled hypertension, diabetes mellitus, iron deficiency anemia associated with menorrhagia here for followup and refill of her medications. Patient reports that she continues to have heavy menstrual bleeding. Her blood pressure medication has been adjusted periodically but continues to be elevated. She reports being compliant with her blood pressure medications but not with diet. She denies any headache, blurred vision, dizziness, chest pain, palpitations, shortness of breath, nausea, vomiting, abdominal pain, bowel or urinary symptoms. Denies leg swellings.  Review of Systems:  As outlined in the history of present illness   Past Medical History  Diagnosis Date  . Anemia   . Hypertension   . Diabetes mellitus   . Pneumonia   . Heart murmur   . CHF (congestive heart failure)    Past Surgical History  Procedure Laterality Date  . Cesarean section     Social History:  reports that she has never smoked. She has never used smokeless tobacco. She reports that  drinks alcohol. She reports that she does not use illicit drugs.  No Known Allergies  Family History  Problem Relation Age of Onset  . Hypertension Mother   . Hypertension Brother     Prior to Admission medications   Medication Sig Start Date End Date Taking? Authorizing Provider  carvedilol (COREG) 6.25 MG tablet Take 2 tablets (12.5 mg total) by mouth 2 (two) times daily with a meal. 09/17/12  Yes Richarda Overlie, MD  ferrous sulfate 325 (65 FE) MG tablet Take 1 tablet (325 mg total) by mouth 2 (two) times daily with a meal. 12/08/12  Yes Eldo Umanzor, MD  glimepiride (AMARYL) 4 MG tablet Take 0.5 tablets (2 mg total) by mouth daily before breakfast. 12/08/12   Yes Jetson Pickrel, MD  glucose blood (RELION GLUCOSE TEST STRIPS) test strip Use as instructed 05/08/12  Yes Olyn Landstrom, MD  hydrALAZINE (APRESOLINE) 25 MG tablet Take 4 tablets (100 mg total) by mouth 3 (three) times daily. 12/08/12  Yes Providence Stivers, MD  metFORMIN (GLUCOPHAGE) 1000 MG tablet Take 1 tablet (1,000 mg total) by mouth 2 (two) times daily at 10 AM and 5 PM. 12/08/12  Yes Maxamillion Banas, MD  ReliOn Alcohol Swabs PADS 1 packet by Does not apply route daily. 05/08/12  Yes Glena Pharris, MD  RELION LANCETS STANDARD 21G MISC 1 Units by Does not apply route daily. 05/08/12  Yes Kaiyan Luczak, MD  lisinopril-hydrochlorothiazide (ZESTORETIC) 20-12.5 MG per tablet Take 1 tablet by mouth 2 (two) times daily. 12/08/12   Jos Cygan, MD    Physical Exam:  Filed Vitals:   12/08/12 1532  BP: 163/106  Pulse: 88  Temp: 98.6 F (37 C)  TempSrc: Oral  Height: 5\' 4"  (1.626 m)  Weight: 213 lb 6.4 oz (96.798 kg)  SpO2: 99%    Constitutional: Vital signs reviewed.  Patient is a well-developed and well-nourished in no acute distress and cooperative with exam.  HEENT: No pallor, moist oral mucosa Cardiovascular: RRR, S1 normal, S2 normal, no murmurs Pulmonary/Chest: CTAB, no wheezes, rales, or rhonchi Abdominal: Soft. Non-tender, non-distended, bowel sounds are normal,   extremities: Warm, no edema Neurological: A&O x3,     Assessment/Plan  Uncontrolled hypertension I will adjust her blood pressure medications  again. I would increase her hydralazine dose 200 mg 2 times a day. Continue with Coreg that was started on Last visit. I would change her Lasix to lisinopril-HCTZ 20-12.5 mg twice daily. I have strongly counseled her on dietary adherence ( which she is not doing) , regular exercise and monitoring her blood pressure closely at home and keep a log of her blood pressure recordings.  Diabetes mellitus A1c of 6. Improved from 9.3 in dec 2013 . Patient encouraged on   keeping her blood sugar well controlled. Continue with metformin and Amaryl  Anemia Secondary to fibroid uterus with menorrhagia. 29 supplements. Patient reports having GYN  follow up in September  History of CHF Patient was admitted to the hospital in December 2013 weighted CHF like in the setting of uncontrolled hypertension and anemia with a Myoview showing EF of 44%. Patient has been remained euvolemic.  Follow up in 2 weeks  Caliann Leckrone  12/08/2012, 3:51 PM

## 2012-12-08 NOTE — Patient Instructions (Signed)
DASH Diet  The DASH diet stands for "Dietary Approaches to Stop Hypertension." It is a healthy eating plan that has been shown to reduce high blood pressure (hypertension) in as little as 14 days, while also possibly providing other significant health benefits. These other health benefits include reducing the risk of breast cancer after menopause and reducing the risk of type 2 diabetes, heart disease, colon cancer, and stroke. Health benefits also include weight loss and slowing kidney failure in patients with chronic kidney disease.   DIET GUIDELINES  · Limit salt (sodium). Your diet should contain less than 1500 mg of sodium daily.  · Limit refined or processed carbohydrates. Your diet should include mostly whole grains. Desserts and added sugars should be used sparingly.  · Include small amounts of heart-healthy fats. These types of fats include nuts, oils, and tub margarine. Limit saturated and trans fats. These fats have been shown to be harmful in the body.  CHOOSING FOODS   The following food groups are based on a 2000 calorie diet. See your Registered Dietitian for individual calorie needs.  Grains and Grain Products (6 to 8 servings daily)  · Eat More Often: Whole-wheat bread, brown rice, whole-grain or wheat pasta, quinoa, popcorn without added fat or salt (air popped).  · Eat Less Often: White bread, white pasta, white rice, cornbread.  Vegetables (4 to 5 servings daily)  · Eat More Often: Fresh, frozen, and canned vegetables. Vegetables may be raw, steamed, roasted, or grilled with a minimal amount of fat.  · Eat Less Often/Avoid: Creamed or fried vegetables. Vegetables in a cheese sauce.  Fruit (4 to 5 servings daily)  · Eat More Often: All fresh, canned (in natural juice), or frozen fruits. Dried fruits without added sugar. One hundred percent fruit juice (½ cup [237 mL] daily).  · Eat Less Often: Dried fruits with added sugar. Canned fruit in light or heavy syrup.  Lean Meats, Fish, and Poultry (2  servings or less daily. One serving is 3 to 4 oz [85-114 g]).  · Eat More Often: Ninety percent or leaner ground beef, tenderloin, sirloin. Round cuts of beef, chicken breast, turkey breast. All fish. Grill, bake, or broil your meat. Nothing should be fried.  · Eat Less Often/Avoid: Fatty cuts of meat, turkey, or chicken leg, thigh, or wing. Fried cuts of meat or fish.  Dairy (2 to 3 servings)  · Eat More Often: Low-fat or fat-free milk, low-fat plain or light yogurt, reduced-fat or part-skim cheese.  · Eat Less Often/Avoid: Milk (whole, 2%). Whole milk yogurt. Full-fat cheeses.  Nuts, Seeds, and Legumes (4 to 5 servings per week)  · Eat More Often: All without added salt.  · Eat Less Often/Avoid: Salted nuts and seeds, canned beans with added salt.  Fats and Sweets (limited)  · Eat More Often: Vegetable oils, tub margarines without trans fats, sugar-free gelatin. Mayonnaise and salad dressings.  · Eat Less Often/Avoid: Coconut oils, palm oils, butter, stick margarine, cream, half and half, cookies, candy, pie.  FOR MORE INFORMATION  The Dash Diet Eating Plan: www.dashdiet.org  Document Released: 04/18/2011 Document Revised: 07/22/2011 Document Reviewed: 04/18/2011  [employer]® Patient Information ©2014 [employer].

## 2013-02-08 ENCOUNTER — Encounter: Payer: Self-pay | Admitting: Obstetrics & Gynecology

## 2013-02-08 ENCOUNTER — Encounter: Payer: Self-pay | Admitting: Obstetrics

## 2013-02-08 ENCOUNTER — Ambulatory Visit (INDEPENDENT_AMBULATORY_CARE_PROVIDER_SITE_OTHER): Payer: Medicaid Other | Admitting: Obstetrics

## 2013-02-08 VITALS — BP 175/102 | HR 88 | Temp 97.9°F | Ht 64.0 in | Wt 213.2 lb

## 2013-02-08 DIAGNOSIS — N939 Abnormal uterine and vaginal bleeding, unspecified: Secondary | ICD-10-CM | POA: Insufficient documentation

## 2013-02-08 DIAGNOSIS — Z113 Encounter for screening for infections with a predominantly sexual mode of transmission: Secondary | ICD-10-CM | POA: Insufficient documentation

## 2013-02-08 DIAGNOSIS — Z23 Encounter for immunization: Secondary | ICD-10-CM

## 2013-02-08 DIAGNOSIS — D649 Anemia, unspecified: Secondary | ICD-10-CM

## 2013-02-08 DIAGNOSIS — Z1239 Encounter for other screening for malignant neoplasm of breast: Secondary | ICD-10-CM

## 2013-02-08 DIAGNOSIS — Z0000 Encounter for general adult medical examination without abnormal findings: Secondary | ICD-10-CM

## 2013-02-08 DIAGNOSIS — D259 Leiomyoma of uterus, unspecified: Secondary | ICD-10-CM

## 2013-02-08 DIAGNOSIS — N920 Excessive and frequent menstruation with regular cycle: Secondary | ICD-10-CM

## 2013-02-08 DIAGNOSIS — I10 Essential (primary) hypertension: Secondary | ICD-10-CM

## 2013-02-08 DIAGNOSIS — E119 Type 2 diabetes mellitus without complications: Secondary | ICD-10-CM

## 2013-02-08 NOTE — Progress Notes (Signed)
.   Subjective:     [name] is a 47 y.o. female here for a problem exam.  Current complaints:Patient has very heavy bleeding during each cycle.  She has a history of fibroids.  Personal health questionnaire reviewed: yes.   Gynecologic History Patient's last menstrual period was 02/08/2013. Contraception: none Tubal Last Pap: 4 years. Results were: normal Last mammogram: 6 years ago. Results were: normal  Obstetric History OB History  No data available     The following portions of the patient's history were reviewed and updated as appropriate: allergies, current medications, past family history, past medical history, past social history, past surgical history and problem list.  Review of Systems Pertinent items are noted in HPI.    Objective:    General appearance: alert and no distress Breasts: normal appearance, no masses or tenderness Abdomen: normal findings: soft, non-tender Pelvic: cervix normal in appearance, external genitalia normal, no adnexal masses or tenderness, no cervical motion tenderness, positive findings: uterine enlargement, uterine fibroids palpable or ~ 16 weeks size, irregular contour, rectovaginal septum normal and vaginal vault with menstrual blood.    Assessment:    Healthy female exam.    Plan:    Education reviewed: low fat, low cholesterol diet, self breast exams and management of uterine fibroids and associated AUB.. Mammogram ordered. Follow up in: 2 weeks. Ultrasound ordered.

## 2013-02-09 LAB — PAP IG W/ RFLX HPV ASCU

## 2013-02-09 LAB — COMPREHENSIVE METABOLIC PANEL
CO2: 28 mEq/L (ref 19–32)
Calcium: 9.8 mg/dL (ref 8.4–10.5)
Creat: 0.78 mg/dL (ref 0.50–1.10)
Glucose, Bld: 108 mg/dL — ABNORMAL HIGH (ref 70–99)
Total Bilirubin: 0.2 mg/dL — ABNORMAL LOW (ref 0.3–1.2)

## 2013-02-09 LAB — CBC WITH DIFFERENTIAL/PLATELET
Eosinophils Absolute: 0.1 10*3/uL (ref 0.0–0.7)
Eosinophils Relative: 1 % (ref 0–5)
HCT: 37.7 % (ref 36.0–46.0)
Hemoglobin: 11.7 g/dL — ABNORMAL LOW (ref 12.0–15.0)
Lymphs Abs: 2 10*3/uL (ref 0.7–4.0)
MCH: 23.1 pg — ABNORMAL LOW (ref 26.0–34.0)
MCV: 74.5 fL — ABNORMAL LOW (ref 78.0–100.0)
Monocytes Absolute: 0.5 10*3/uL (ref 0.1–1.0)
Monocytes Relative: 6 % (ref 3–12)
Platelets: 365 10*3/uL (ref 150–400)
RBC: 5.06 MIL/uL (ref 3.87–5.11)

## 2013-02-09 LAB — TSH: TSH: 0.608 u[IU]/mL (ref 0.350–4.500)

## 2013-02-09 LAB — WET PREP BY MOLECULAR PROBE: Candida species: NEGATIVE

## 2013-02-17 ENCOUNTER — Ambulatory Visit (HOSPITAL_COMMUNITY)
Admission: RE | Admit: 2013-02-17 | Discharge: 2013-02-17 | Disposition: A | Discharge status: Home / Self Care | Payer: Medicaid Other | Source: Ambulatory Visit | Attending: Obstetrics | Admitting: Obstetrics

## 2013-02-17 ENCOUNTER — Other Ambulatory Visit: Payer: Self-pay | Admitting: Obstetrics

## 2013-02-17 DIAGNOSIS — D250 Submucous leiomyoma of uterus: Secondary | ICD-10-CM | POA: Insufficient documentation

## 2013-02-17 DIAGNOSIS — Z1231 Encounter for screening mammogram for malignant neoplasm of breast: Secondary | ICD-10-CM | POA: Insufficient documentation

## 2013-02-17 DIAGNOSIS — D259 Leiomyoma of uterus, unspecified: Secondary | ICD-10-CM

## 2013-02-17 DIAGNOSIS — Z1239 Encounter for other screening for malignant neoplasm of breast: Secondary | ICD-10-CM

## 2013-02-23 ENCOUNTER — Ambulatory Visit: Payer: Medicaid Other | Admitting: Obstetrics

## 2013-07-07 ENCOUNTER — Encounter (HOSPITAL_COMMUNITY): Payer: Self-pay | Admitting: Emergency Medicine

## 2013-07-07 ENCOUNTER — Ambulatory Visit: Payer: BC Managed Care – PPO | Attending: Internal Medicine | Admitting: Internal Medicine

## 2013-07-07 ENCOUNTER — Emergency Department (HOSPITAL_COMMUNITY)
Admission: EM | Admit: 2013-07-07 | Discharge: 2013-07-07 | Disposition: A | Discharge status: Home / Self Care | Payer: BC Managed Care – PPO | Attending: Emergency Medicine | Admitting: Emergency Medicine

## 2013-07-07 VITALS — BP 223/141 | HR 108 | Temp 99.5°F | Resp 16 | Ht 64.0 in | Wt 210.0 lb

## 2013-07-07 DIAGNOSIS — I10 Essential (primary) hypertension: Secondary | ICD-10-CM

## 2013-07-07 DIAGNOSIS — D649 Anemia, unspecified: Secondary | ICD-10-CM | POA: Insufficient documentation

## 2013-07-07 DIAGNOSIS — I509 Heart failure, unspecified: Secondary | ICD-10-CM | POA: Diagnosis not present

## 2013-07-07 DIAGNOSIS — R51 Headache: Secondary | ICD-10-CM | POA: Diagnosis not present

## 2013-07-07 DIAGNOSIS — E119 Type 2 diabetes mellitus without complications: Secondary | ICD-10-CM | POA: Diagnosis not present

## 2013-07-07 DIAGNOSIS — Z79899 Other long term (current) drug therapy: Secondary | ICD-10-CM | POA: Insufficient documentation

## 2013-07-07 DIAGNOSIS — Z09 Encounter for follow-up examination after completed treatment for conditions other than malignant neoplasm: Secondary | ICD-10-CM | POA: Diagnosis present

## 2013-07-07 DIAGNOSIS — R011 Cardiac murmur, unspecified: Secondary | ICD-10-CM | POA: Diagnosis not present

## 2013-07-07 DIAGNOSIS — Z8701 Personal history of pneumonia (recurrent): Secondary | ICD-10-CM | POA: Insufficient documentation

## 2013-07-07 LAB — I-STAT CHEM 8, ED
BUN: 12 mg/dL (ref 6–23)
Calcium, Ion: 1.16 mmol/L (ref 1.12–1.23)
Chloride: 101 mEq/L (ref 96–112)
Creatinine, Ser: 0.7 mg/dL (ref 0.50–1.10)
Glucose, Bld: 163 mg/dL — ABNORMAL HIGH (ref 70–99)
HCT: 31 % — ABNORMAL LOW (ref 36.0–46.0)
Hemoglobin: 10.5 g/dL — ABNORMAL LOW (ref 12.0–15.0)
Potassium: 3.4 mEq/L — ABNORMAL LOW (ref 3.7–5.3)
Sodium: 139 mEq/L (ref 137–147)
TCO2: 27 mmol/L (ref 0–100)

## 2013-07-07 MED ORDER — LABETALOL HCL 5 MG/ML IV SOLN: 5.0000 mg | Freq: Once | INTRAVENOUS | Status: DC

## 2013-07-07 MED ORDER — LISINOPRIL 20 MG PO TABS: 20.0000 mg | ORAL_TABLET | Freq: Every day | ORAL | Status: DC

## 2013-07-07 MED ORDER — HYDROCHLOROTHIAZIDE 25 MG PO TABS: 25.0000 mg | ORAL_TABLET | Freq: Every day | ORAL | Status: DC

## 2013-07-07 MED ORDER — HYDROCHLOROTHIAZIDE 25 MG PO TABS
25.0000 mg | ORAL_TABLET | Freq: Once | ORAL | Status: AC
  Administered 2013-07-07: 25 mg via ORAL
  Filled 2013-07-07: qty 1

## 2013-07-07 MED ORDER — HYDRALAZINE HCL 20 MG/ML IJ SOLN
5.0000 mg | Freq: Once | INTRAMUSCULAR | Status: AC
  Administered 2013-07-07: 5 mg via INTRAVENOUS
  Filled 2013-07-07: qty 1

## 2013-07-07 MED ORDER — CARVEDILOL 6.25 MG PO TABS
6.2500 mg | ORAL_TABLET | Freq: Once | ORAL | Status: AC
  Administered 2013-07-07: 6.25 mg via ORAL
  Filled 2013-07-07 (×2): qty 1

## 2013-07-07 MED ORDER — LISINOPRIL 20 MG PO TABS
20.0000 mg | ORAL_TABLET | Freq: Once | ORAL | Status: AC
  Administered 2013-07-07: 20 mg via ORAL
  Filled 2013-07-07: qty 1

## 2013-07-07 NOTE — Progress Notes (Signed)
Pt is in HTN crisis 223/141 Sending pt to the ED

## 2013-07-07 NOTE — Progress Notes (Signed)
MRN: [id number] Name: [name]  Sex: female Age: 48 y.o. DOB: 1965/07/01  Allergies: Review of patient's allergies indicates no known allergies.  No chief complaint on file.   HPI: Patient is 48 y.o. female who has history of hypertension diabetes, chem today for followup reported to have some headache, her blood pressure is 223/141 has low-grade fever patient is tachycardic, she denies any chest pain or shortness of breath, patient is advised to go to the ER she will need IV antihypertensive medication at this point.   Past Medical History  Diagnosis Date  . Anemia   . Hypertension   . Diabetes mellitus   . Pneumonia   . Heart murmur   . CHF (congestive heart failure)     Past Surgical History  Procedure Laterality Date  . Cesarean section    . Tubal ligation        Medication List       This list is accurate as of: 07/07/13  6:10 PM.  Always use your most recent med list.               carvedilol 6.25 MG tablet  Commonly known as:  COREG  Take 2 tablets (12.5 mg total) by mouth 2 (two) times daily with a meal.     ferrous sulfate 325 (65 FE) MG tablet  Take 1 tablet (325 mg total) by mouth 2 (two) times daily with a meal.     glimepiride 4 MG tablet  Commonly known as:  AMARYL  Take 0.5 tablets (2 mg total) by mouth daily before breakfast.     glucose blood test strip  Commonly known as:  RELION GLUCOSE TEST STRIPS  Use as instructed     hydrALAZINE 25 MG tablet  Commonly known as:  APRESOLINE  Take 4 tablets (100 mg total) by mouth 3 (three) times daily.     lisinopril-hydrochlorothiazide 20-12.5 MG per tablet  Commonly known as:  ZESTORETIC  Take 1 tablet by mouth 2 (two) times daily.     metFORMIN 1000 MG tablet  Commonly known as:  GLUCOPHAGE  Take 1 tablet (1,000 mg total) by mouth 2 (two) times daily at 10 AM and 5 PM.     RELION ALCOHOL SWABS Pads  1 packet by Does not apply route daily.     RELION LANCETS STANDARD 21G Misc  1  Units by Does not apply route daily.        No orders of the defined types were placed in this encounter.    Immunization History  Administered Date(s) Administered  . Influenza Split 05/18/2012  . Influenza,inj,Quad PF,36+ Mos 02/08/2013    Family History  Problem Relation Age of Onset  . Hypertension Mother   . Hypertension Brother     History  Substance Use Topics  . Smoking status: Never Smoker   . Smokeless tobacco: Never Used  . Alcohol Use: Yes     Comment: occasional    Review of Systems   As noted in HPI  Filed Vitals:   07/07/13 1734  BP: 223/141  Pulse: 108  Temp: 99.5 F (37.5 C)  Resp: 16    Physical Exam  Physical Exam  Constitutional: No distress.  Eyes: EOM are normal. Pupils are equal, round, and reactive to light.  Cardiovascular: Normal rate and regular rhythm.   Pulmonary/Chest: Breath sounds normal. No respiratory distress. She has no wheezes. She has no rales.    CBC    Component Value  Date/Time   WBC 7.7 02/08/2013 1555   RBC 5.06 02/08/2013 1555   RBC 3.87 05/06/2012 1645   HGB 11.7* 02/08/2013 1555   HCT 37.7 02/08/2013 1555   PLT 365 02/08/2013 1555   MCV 74.5* 02/08/2013 1555   LYMPHSABS 2.0 02/08/2013 1555   MONOABS 0.5 02/08/2013 1555   EOSABS 0.1 02/08/2013 1555   BASOSABS 0.0 02/08/2013 1555    CMP     Component Value Date/Time   NA 141 02/08/2013 1555   K 4.1 02/08/2013 1555   CL 104 02/08/2013 1555   CO2 28 02/08/2013 1555   GLUCOSE 108* 02/08/2013 1555   BUN 15 02/08/2013 1555   CREATININE 0.78 02/08/2013 1555   CREATININE 0.72 09/17/2012 1752   CALCIUM 9.8 02/08/2013 1555   PROT 7.3 02/08/2013 1555   ALBUMIN 4.1 02/08/2013 1555   AST 17 02/08/2013 1555   ALT <8 02/08/2013 1555   ALKPHOS 96 02/08/2013 1555   BILITOT 0.2* 02/08/2013 1555   GFRNONAA >90 09/17/2012 1752   GFRAA >90 09/17/2012 1752    Lab Results  Component Value Date/Time   CHOL 170 07/22/2007 10:01 PM    No components found with this basename: hga1c     Lab Results  Component Value Date/Time   AST 17 02/08/2013  3:55 PM    Assessment and Plan  Accelerated hypertension Current blood pressure is 223/141 patient is being sent to the ER, needs IV antihypertensive medication, she'll follow up in our office after the discharge.  Lorayne Marek, MD

## 2013-07-07 NOTE — Discharge Instructions (Signed)
How to Take Your Blood Pressure  These instructions are only for electronic home blood pressure machines. You will need:   An automatic or semi-automatic blood pressure machine.  Fresh batteries for the blood pressure machine. HOW DO I USE THESE TOOLS TO CHECK MY BLOOD PRESSURE?   There are 2 numbers that make up your blood pressure. For example: 120/80.  The first number (120 in our example) is called the "systolic pressure." It is a measure of the pressure in your blood vessels when your heart is pumping blood.  The second number (80 in our example) is called the "diastolic pressure." It is a measure of the pressure in your blood vessels when your heart is resting between beats.  Before you buy a home blood pressure machine, check the size of your arm so you can buy the right size cuff. Here is how to check the size of your arm:  Use a tape measure that shows both inches and centimeters.  Wrap the tape measure around the middle upper part of your arm. You may need someone to help you measure right.  Write down your arm measurement in both inches and centimeters.  To measure your blood pressure right, it is important to have the right size cuff.  If your arm is up to 13 inches (37 to 34 centimeters), get an adult cuff size.  If your arm is 13 to 17 inches (35 to 44 centimeters), get a large adult cuff size.  If your arm is 17 to 20 inches (45 to 52 centimeters), get an adult thigh cuff.  Try to rest or relax for at least 30 minutes before you check your blood pressure.  Do not smoke.  Do not have any drinks with caffeine, such as:  Pop.  Coffee.  Tea.  Check your blood pressure in a quiet room.  Sit down and stretch out your arm on a table. Keep your arm at about the level of your heart. Let your arm relax. GETTING BLOOD PRESSURE READINGS  Make sure you remove any tight-fighting clothing from your arm. Wrap the cuff around your upper arm. Wrap it just above the bend,  and above where you felt the pulse. You should be able to slip a finger between the cuff and your arm. If you cannot slip a finger in the cuff, it is too tight and should be removed and rewrapped.  Some units requires you to manually pump up the arm cuff.  Automatic units inflate the cuff when you press a button.  Cuff deflation is automatic in both models.  After the cuff is inflated, the unit measures your blood pressure and pulse. The readings are displayed on a monitor. Hold still and breathe normally while the cuff is inflated.  Getting a reading takes less than a minute.  Some models store readings in a memory. Some provide a printout of readings.  Get readings at different times of the day. You should wait at least 5 minutes between readings. Take readings with you to your next doctor's visit. Document Released: 04/11/2008 Document Revised: 07/22/2011 Document Reviewed: 04/11/2008 [facility] Patient Information 2014 [place].  Arterial Hypertension Arterial hypertension (high blood pressure) is a condition of elevated pressure in your blood vessels. Hypertension over a long period of time is a risk factor for strokes, heart attacks, and heart failure. It is also the leading cause of kidney (renal) failure.  CAUSES   In Adults -- Over 90% of all hypertension has no known  cause. This is called essential or primary hypertension. In the other 10% of people with hypertension, the increase in blood pressure is caused by another disorder. This is called secondary hypertension. Important causes of secondary hypertension are:  Heavy alcohol use.  Obstructive sleep apnea.  Hyperaldosterosim (Conn's syndrome).  Steroid use.  Chronic kidney failure.  Hyperparathyroidism.  Medications.  Renal artery stenosis.  Pheochromocytoma.  Cushing's disease.  Coarctation of the aorta.  Scleroderma renal crisis.  Licorice (in excessive amounts).  Drugs (cocaine,  methamphetamine). Your caregiver can explain any items above that apply to you.  In Children -- Secondary hypertension is more common and should always be considered.  Pregnancy -- Few women of childbearing age have high blood pressure. However, up to 10% of them develop hypertension of pregnancy. Generally, this will not harm the woman. It may be a sign of 3 complications of pregnancy: preeclampsia, HELLP syndrome, and eclampsia. Follow up and control with medication is necessary. SYMPTOMS   This condition normally does not produce any noticeable symptoms. It is usually found during a routine exam.  Malignant hypertension is a late problem of high blood pressure. It may have the following symptoms:  Headaches.  Blurred vision.  End-organ damage (this means your kidneys, heart, lungs, and other organs are being damaged).  Stressful situations can increase the blood pressure. If a person with normal blood pressure has their blood pressure go up while being seen by their caregiver, this is often termed "white coat hypertension." Its importance is not known. It may be related with eventually developing hypertension or complications of hypertension.  Hypertension is often confused with mental tension, stress, and anxiety. DIAGNOSIS  The diagnosis is made by 3 separate blood pressure measurements. They are taken at least 1 week apart from each other. If there is organ damage from hypertension, the diagnosis may be made without repeat measurements. Hypertension is usually identified by having blood pressure readings:  Above 140/90 mmHg measured in both arms, at 3 separate times, over a couple weeks.  Over 130/80 mmHg should be considered a risk factor and may require treatment in patients with diabetes. Blood pressure readings over 120/80 mmHg are called "pre-hypertension" even in non-diabetic patients. To get a true blood pressure measurement, use the following guidelines. Be aware of the  factors that can alter blood pressure readings.  Take measurements at least 1 hour after caffeine.  Take measurements 30 minutes after smoking and without any stress. This is another reason to quit smoking  it raises your blood pressure.  Use a proper cuff size. Ask your caregiver if you are not sure about your cuff size.  Most home blood pressure cuffs are automatic. They will measure systolic and diastolic pressures. The systolic pressure is the pressure reading at the start of sounds. Diastolic pressure is the pressure at which the sounds disappear. If you are elderly, measure pressures in multiple postures. Try sitting, lying or standing.  Sit at rest for a minimum of 5 minutes before taking measurements.  You should not be on any medications like decongestants. These are found in many cold medications.  Record your blood pressure readings and review them with your caregiver. If you have hypertension:  Your caregiver may do tests to be sure you do not have secondary hypertension (see "causes" above).  Your caregiver may also look for signs of metabolic syndrome. This is also called Syndrome X or Insulin Resistance Syndrome. You may have this syndrome if you have type 2 diabetes, abdominal  obesity, and abnormal blood lipids in addition to hypertension.  Your caregiver will take your medical and family history and perform a physical exam.  Diagnostic tests may include blood tests (for glucose, cholesterol, potassium, and kidney function), a urinalysis, or an EKG. Other tests may also be necessary depending on your condition. PREVENTION  There are important lifestyle issues that you can adopt to reduce your chance of developing hypertension:  Maintain a normal weight.  Limit the amount of salt (sodium) in your diet.  Exercise often.  Limit alcohol intake.  Get enough potassium in your diet. Discuss specific advice with your caregiver.  Follow a DASH diet (dietary approaches to  stop hypertension). This diet is rich in fruits, vegetables, and low-fat dairy products, and avoids certain fats. PROGNOSIS  Essential hypertension cannot be cured. Lifestyle changes and medical treatment can lower blood pressure and reduce complications. The prognosis of secondary hypertension depends on the underlying cause. Many people whose hypertension is controlled with medicine or lifestyle changes can live a normal, healthy life.  RISKS AND COMPLICATIONS  While high blood pressure alone is not an illness, it often requires treatment due to its short- and long-term effects on many organs. Hypertension increases your risk for:  CVAs or strokes (cerebrovascular accident).  Heart failure due to chronically high blood pressure (hypertensive cardiomyopathy).  Heart attack (myocardial infarction).  Damage to the retina (hypertensive retinopathy).  Kidney failure (hypertensive nephropathy). Your caregiver can explain list items above that apply to you. Treatment of hypertension can significantly reduce the risk of complications. TREATMENT   For overweight patients, weight loss and regular exercise are recommended. Physical fitness lowers blood pressure.  Mild hypertension is usually treated with diet and exercise. A diet rich in fruits and vegetables, fat-free dairy products, and foods low in fat and salt (sodium) can help lower blood pressure. Decreasing salt intake decreases blood pressure in a 1/3 of people.  Stop smoking if you are a smoker. The steps above are highly effective in reducing blood pressure. While these actions are easy to suggest, they are difficult to achieve. Most patients with moderate or severe hypertension end up requiring medications to bring their blood pressure down to a normal level. There are several classes of medications for treatment. Blood pressure pills (antihypertensives) will lower blood pressure by their different actions. Lowering the blood pressure by 10  mmHg may decrease the risk of complications by as much as 25%. The goal of treatment is effective blood pressure control. This will reduce your risk for complications. Your caregiver will help you determine the best treatment for you according to your lifestyle. What is excellent treatment for one person, may not be for you. HOME CARE INSTRUCTIONS   Do not smoke.  Follow the lifestyle changes outlined in the "Prevention" section.  If you are on medications, follow the directions carefully. Blood pressure medications must be taken as prescribed. Skipping doses reduces their benefit. It also puts you at risk for problems.  Follow up with your caregiver, as directed.  If you are asked to monitor your blood pressure at home, follow the guidelines in the "Diagnosis" section above. SEEK MEDICAL CARE IF:   You think you are having medication side effects.  You have recurrent headaches or lightheadedness.  You have swelling in your ankles.  You have trouble with your vision. SEEK IMMEDIATE MEDICAL CARE IF:   You have sudden onset of chest pain or pressure, difficulty breathing, or other symptoms of a heart attack.  You  have a severe headache.  You have symptoms of a stroke (such as sudden weakness, difficulty speaking, difficulty walking). MAKE SURE YOU:   Understand these instructions.  Will watch your condition.  Will get help right away if you are not doing well or get worse. Document Released: 04/29/2005 Document Revised: 07/22/2011 Document Reviewed: 11/27/2006 [facility] Patient Information 2014 [place].

## 2013-07-07 NOTE — ED Notes (Signed)
Pt walked to RR. Non-slip socks in place.

## 2013-07-07 NOTE — ED Provider Notes (Signed)
CSN: [id number]     Arrival date & time 07/07/13  1819 History   First MD Initiated Contact with Patient 07/07/13 1902     Chief Complaint  Patient presents with  . Hypertension     HPI  48 yo F with known hx of HTN and DM who presents with HTN. She has not taken her medications in 1 month because she felt they aren't working. Initial BP was 223/414. She denies any HA, blurry vision, CP, SOB, Abd pain, frequency or change in her urine. BP while taking the hx with SBP of 160's. The patient states she would like to start working on diet/exercise and taking her medications as she is worried about her BP.   Past Medical History  Diagnosis Date  . Anemia   . Hypertension   . Diabetes mellitus   . Pneumonia   . Heart murmur   . CHF (congestive heart failure)    Past Surgical History  Procedure Laterality Date  . Cesarean section    . Tubal ligation     Family History  Problem Relation Age of Onset  . Hypertension Mother   . Hypertension Brother    History  Substance Use Topics  . Smoking status: Never Smoker   . Smokeless tobacco: Never Used  . Alcohol Use: Yes     Comment: occasional   OB History   Grav Para Term Preterm Abortions TAB SAB Ect Mult Living                 Review of Systems  Constitutional: Negative for fever, chills, activity change and appetite change.  HENT: Negative for congestion, ear pain and rhinorrhea.   Eyes: Negative for pain.  Respiratory: Negative for cough and shortness of breath.   Cardiovascular: Negative for chest pain and palpitations.  Gastrointestinal: Negative for nausea, vomiting and abdominal pain.  Genitourinary: Negative for dysuria, difficulty urinating and pelvic pain.  Musculoskeletal: Negative for back pain and neck pain.  Skin: Negative for rash and wound.  Neurological: Negative for weakness and headaches.  Psychiatric/Behavioral: Negative for behavioral problems, confusion and agitation.      Allergies  Review of  patient's allergies indicates no known allergies.  Home Medications   Current Outpatient Rx  Name  Route  Sig  Dispense  Refill  . carvedilol (COREG) 6.25 MG tablet   Oral   Take 12.5 mg by mouth 2 (two) times daily with a meal.         . ferrous sulfate 325 (65 FE) MG tablet   Oral   Take 325 mg by mouth 2 (two) times daily with a meal.         . hydrALAZINE (APRESOLINE) 25 MG tablet   Oral   Take 100 mg by mouth 3 (three) times daily.         Marland Kitchen lisinopril-hydrochlorothiazide (PRINZIDE,ZESTORETIC) 20-12.5 MG per tablet   Oral   Take 1 tablet by mouth daily.         . metFORMIN (GLUCOPHAGE) 1000 MG tablet   Oral   Take 1,000 mg by mouth 2 (two) times daily with a meal.          BP 173/107  Pulse 92  Temp(Src) 98.3 F (36.8 C) (Oral)  Resp 24  SpO2 100%  LMP 06/22/2013 Physical Exam  Constitutional: She is oriented to person, place, and time. She appears well-developed and well-nourished. No distress.  HENT:  Head: Normocephalic and atraumatic.  Nose: Nose normal.  Mouth/Throat:  Oropharynx is clear and moist.  Eyes: EOM are normal. Pupils are equal, round, and reactive to light.  Neck: Normal range of motion. Neck supple. No tracheal deviation present.  Cardiovascular: Normal rate, regular rhythm, normal heart sounds and intact distal pulses.   Pulmonary/Chest: Effort normal and breath sounds normal. She has no rales.  Abdominal: Soft. Bowel sounds are normal. She exhibits no distension. There is no tenderness. There is no rebound and no guarding.  Musculoskeletal: Normal range of motion. She exhibits no tenderness.  Neurological: She is alert and oriented to person, place, and time.  Skin: Skin is warm and dry. No rash noted.  Psychiatric: She has a normal mood and affect. Her behavior is normal.    ED Course  Procedures (including critical care time) Labs Review Results for orders placed during the hospital encounter of 07/07/13  I-STAT CHEM 8, ED       Result Value Ref Range   Sodium 139  137 - 147 mEq/L   Potassium 3.4 (*) 3.7 - 5.3 mEq/L   Chloride 101  96 - 112 mEq/L   BUN 12  6 - 23 mg/dL   Creatinine, Ser 0.70  0.50 - 1.10 mg/dL   Glucose, Bld 163 (*) 70 - 99 mg/dL   Calcium, Ion 1.16  1.12 - 1.23 mmol/L   TCO2 27  0 - 100 mmol/L   Hemoglobin 10.5 (*) 12.0 - 15.0 g/dL   HCT 31.0 (*) 36.0 - 46.0 %     MDM   Final diagnoses:  Uncontrolled hypertension    48 yo F in NAD afebrile and HDS. Patient presents with asymptomatic HTN. Has not been taking her medications.  Administered home medications here in ED. BP improved prior to intervention. Neuro exam non focal. No HA. No blurry vision. Chem 8 with no AKI. Case co managed with my attending Dr. Wilson Singer. Thorough discussion with patient on plan , findings and return precautions. Will follow up with PCP in 2-3 days for recheck of BP or sooner in ED if any worsening or concerns.     Ruthell Rummage, MD 07/07/13 2153

## 2013-07-07 NOTE — ED Notes (Signed)
Pt EMS- [facility] noticed that her BP was elevated. Pt has hx of htn and diabetes. Pt is noncompliant with medication and has not taken BP meds in 1 month. Pt reports generalized weakness and feeling tired.

## 2013-07-08 NOTE — ED Provider Notes (Signed)
I saw and evaluated the patient, reviewed the resident's note and I agree with the findings and plan.  EKG Interpretation   None       48yF with asymptomatic HTN. Pt intermittently compliant with meds.Discussed importance of taking as prescribed and keeping log. Chem unremarkable. Outpt FU.   Virgel Manifold, MD 07/08/13 [phone]

## 2013-07-09 ENCOUNTER — Telehealth: Payer: Self-pay | Admitting: Family Medicine

## 2013-07-09 ENCOUNTER — Other Ambulatory Visit: Payer: Self-pay

## 2013-07-09 MED ORDER — METFORMIN HCL 1000 MG PO TABS: 1000.0000 mg | ORAL_TABLET | Freq: Two times a day (BID) | ORAL | Status: DC

## 2013-07-09 MED ORDER — HYDRALAZINE HCL 25 MG PO TABS: 100.0000 mg | ORAL_TABLET | Freq: Three times a day (TID) | ORAL | Status: DC

## 2013-07-09 MED ORDER — FERROUS SULFATE 325 (65 FE) MG PO TABS: 325.0000 mg | ORAL_TABLET | Freq: Two times a day (BID) | ORAL | Status: DC

## 2013-07-09 NOTE — Telephone Encounter (Signed)
Pt.Marland KitchenMarland KitchenMarland KitchenNeeds refill on Hydralizine, Metformin, and Ferrous Sulfate..Pt. Has doctor's appt. with clinic on 08/11/13, due to her job she can only come in late afternoons.Marland KitchenMarland KitchenMarland Kitchen

## 2013-07-12 MED ORDER — HYDRALAZINE HCL 50 MG PO TABS: 100.0000 mg | ORAL_TABLET | Freq: Three times a day (TID) | ORAL | Status: DC

## 2013-07-13 NOTE — Telephone Encounter (Signed)
Has been filled.

## 2013-08-10 ENCOUNTER — Ambulatory Visit: Payer: BC Managed Care – PPO | Admitting: Internal Medicine

## 2013-08-19 ENCOUNTER — Telehealth: Payer: Self-pay | Admitting: Internal Medicine

## 2013-08-19 ENCOUNTER — Other Ambulatory Visit: Payer: Self-pay

## 2013-08-19 MED ORDER — CARVEDILOL 6.25 MG PO TABS: 12.5000 mg | ORAL_TABLET | Freq: Two times a day (BID) | ORAL | Status: DC

## 2013-08-19 MED ORDER — FERROUS SULFATE 325 (65 FE) MG PO TABS: 325.0000 mg | ORAL_TABLET | Freq: Two times a day (BID) | ORAL | Status: DC

## 2013-08-19 MED ORDER — METFORMIN HCL 1000 MG PO TABS: 1000.0000 mg | ORAL_TABLET | Freq: Two times a day (BID) | ORAL | Status: DC

## 2013-08-19 MED ORDER — HYDRALAZINE HCL 50 MG PO TABS: 100.0000 mg | ORAL_TABLET | Freq: Three times a day (TID) | ORAL | Status: DC

## 2013-08-19 MED ORDER — LISINOPRIL-HYDROCHLOROTHIAZIDE 20-12.5 MG PO TABS: 1.0000 | ORAL_TABLET | Freq: Every day | ORAL | Status: DC

## 2013-08-19 NOTE — Progress Notes (Unsigned)
Patient came into facility needing refills on all her medications Refilled ferrous sulfate,lisinopril/hctz, metformin carvedilol and hydralazine [facility]

## 2013-08-19 NOTE — Telephone Encounter (Signed)
Pt has come in today requesting a refill on all her medications; pt is present in the lobby and has provided a list of medications to be refilled; pt does not have another scheduled appt until 54/15.

## 2013-08-30 ENCOUNTER — Ambulatory Visit (INDEPENDENT_AMBULATORY_CARE_PROVIDER_SITE_OTHER): Payer: BC Managed Care – PPO | Admitting: Medical

## 2013-08-30 ENCOUNTER — Encounter: Payer: Self-pay | Admitting: Medical

## 2013-08-30 VITALS — BP 162/82 | HR 82 | Temp 98.6°F | Resp 16 | Wt 206.0 lb

## 2013-08-30 DIAGNOSIS — N852 Hypertrophy of uterus: Secondary | ICD-10-CM | POA: Diagnosis not present

## 2013-08-30 DIAGNOSIS — I10 Essential (primary) hypertension: Secondary | ICD-10-CM | POA: Diagnosis not present

## 2013-08-30 DIAGNOSIS — Z8679 Personal history of other diseases of the circulatory system: Secondary | ICD-10-CM

## 2013-08-30 DIAGNOSIS — IMO0001 Reserved for inherently not codable concepts without codable children: Secondary | ICD-10-CM

## 2013-08-30 DIAGNOSIS — R5383 Other fatigue: Secondary | ICD-10-CM | POA: Diagnosis not present

## 2013-08-30 DIAGNOSIS — Z862 Personal history of diseases of the blood and blood-forming organs and certain disorders involving the immune mechanism: Secondary | ICD-10-CM

## 2013-08-30 DIAGNOSIS — E1165 Type 2 diabetes mellitus with hyperglycemia: Secondary | ICD-10-CM

## 2013-08-30 DIAGNOSIS — R5381 Other malaise: Secondary | ICD-10-CM

## 2013-08-30 NOTE — Progress Notes (Addendum)
Subjective:   [name] is a 48 y.o. female presenting on 08/30/2013 with ESTABLISH AS A NEW PATIENT and Hypertension  Returning patient, was seen here in past, >3 years ago.  Here to re-establish.    She notes significant fatigue of late x last 2 months+, BP not under control, occasional nausea when she takes  her medication. [facility].  Fatigue has just gotten worse.  She does note having to have blood transfusion back in 2013, due to heavy periods, anemia.  Been having diarrhea for an hour or 2 after taking metformin.  Has ice craving as well.   Not checking BPs regularly, but lately been seeing in the 160s SBP.  Takes her BP medication daily, but only takes Hydralazine BID instead of TID, and takes metformin once daily instead of BID as directed.  Hx/o CHF per chart.   She says she is not good about salt intake, not checking daily weights.  Has had stress test and echo in past but no prior cardiology consult as outpatient.   No other aggravating or relieving factors.    No other complaint.  Review of Systems ROS as in subjective      Objective:    BP 162/82  Pulse 82  Temp(Src) 98.6 F (37 C) (Oral)  Resp 16  Wt 206 lb (93.441 kg)  General appearance: alert, no distress, WD/WN Oral cavity: MMM, no lesions Neck: supple, no lymphadenopathy, no thyromegaly, no masses, obvious pulsation but no JVD Heart: 2/6 holosystolic murmur heard best in right upper sternal border, otherwise RRR, normal S1, S2 Lungs: CTA bilaterally, no wheezes, rhonchi, or rales Abdomen: +bs, soft, there is an obvious enlarged uterus on palpation, otherwise non tender, non distended, no hepatomegaly, no splenomegaly Pulses: 2+ symmetric, upper and lower extremities, normal cap refill Ext: no obvious edema     Assessment: Encounter Diagnoses  Name Primary?  . Other malaise and fatigue Yes  . Type II or unspecified type diabetes mellitus without mention of  complication, uncontrolled   . Essential hypertension, benign   . History of CHF (congestive heart failure)   . History of anemia   . Enlarged uterus      Plan: Discussed her concerns, symptoms which is possibly related to anemia, but could be related to other causes.   Full panel of labs today given her multiple chronic issues including anemia, CHF, diabetes type 2, hypertension which is not controlled. I reviewed her echocardiogram and stress that are in the chart from 2013.  Advised that she needs to do better with salt, daily weight checks, compliance with medications.  Given her diarrhea, pending labs we'll have to modify her metformin dose.  There is murmur, and uncontrolled blood pressure noted today.  We'll need to make medication modifications pending labs   Eleny was seen today for establish as a new patient and hypertension.  Diagnoses and associated orders for this visit:  Other malaise and fatigue - Comprehensive metabolic panel - CBC with Differential - Hemoglobin A1c - Brain natriuretic peptide - Iron and TIBC  Type II or unspecified type diabetes mellitus without mention of complication, uncontrolled - Comprehensive metabolic panel - CBC with Differential - Hemoglobin A1c - Brain natriuretic peptide - Iron and TIBC  Essential hypertension, benign - Comprehensive metabolic panel - CBC with Differential - Hemoglobin A1c - Brain natriuretic peptide - Iron and TIBC  History of CHF (congestive heart failure) - Comprehensive metabolic panel - CBC with Differential -  Hemoglobin A1c - Brain natriuretic peptide - Iron and TIBC  History of anemia - Comprehensive metabolic panel - CBC with Differential - Hemoglobin A1c - Brain natriuretic peptide - Iron and TIBC  Enlarged uterus    Return pending labs.

## 2013-08-31 LAB — CBC WITH DIFFERENTIAL/PLATELET
BASOS ABS: 0 10*3/uL (ref 0.0–0.1)
BASOS PCT: 0 % (ref 0–1)
Eosinophils Absolute: 0.1 10*3/uL (ref 0.0–0.7)
Eosinophils Relative: 1 % (ref 0–5)
HCT: 32.9 % — ABNORMAL LOW (ref 36.0–46.0)
Hemoglobin: 10 g/dL — ABNORMAL LOW (ref 12.0–15.0)
LYMPHS PCT: 25 % (ref 12–46)
Lymphs Abs: 1.7 10*3/uL (ref 0.7–4.0)
MCH: 22 pg — ABNORMAL LOW (ref 26.0–34.0)
MCHC: 30.4 g/dL (ref 30.0–36.0)
MCV: 72.5 fL — ABNORMAL LOW (ref 78.0–100.0)
MONO ABS: 0.5 10*3/uL (ref 0.1–1.0)
MONOS PCT: 7 % (ref 3–12)
NEUTROS ABS: 4.6 10*3/uL (ref 1.7–7.7)
NEUTROS PCT: 67 % (ref 43–77)
Platelets: 415 10*3/uL — ABNORMAL HIGH (ref 150–400)
RBC: 4.54 MIL/uL (ref 3.87–5.11)
RDW: 17.9 % — ABNORMAL HIGH (ref 11.5–15.5)
WBC: 6.8 10*3/uL (ref 4.0–10.5)

## 2013-08-31 LAB — COMPREHENSIVE METABOLIC PANEL
ALBUMIN: 3.9 g/dL (ref 3.5–5.2)
ALT: 8 U/L (ref 0–35)
AST: 13 U/L (ref 0–37)
Alkaline Phosphatase: 89 U/L (ref 39–117)
BUN: 19 mg/dL (ref 6–23)
CHLORIDE: 104 meq/L (ref 96–112)
CO2: 22 meq/L (ref 19–32)
Calcium: 9.2 mg/dL (ref 8.4–10.5)
Creat: 0.63 mg/dL (ref 0.50–1.10)
GLUCOSE: 204 mg/dL — AB (ref 70–99)
POTASSIUM: 4.1 meq/L (ref 3.5–5.3)
Sodium: 135 mEq/L (ref 135–145)
Total Bilirubin: 0.2 mg/dL (ref 0.2–1.2)
Total Protein: 7.1 g/dL (ref 6.0–8.3)

## 2013-08-31 LAB — IRON AND TIBC
%SAT: 24 % (ref 20–55)
Iron: 116 ug/dL (ref 42–145)
TIBC: 478 ug/dL — AB (ref 250–470)
UIBC: 362 ug/dL (ref 125–400)

## 2013-08-31 LAB — HEMOGLOBIN A1C
HEMOGLOBIN A1C: 7.7 % — AB (ref <5.7)
MEAN PLASMA GLUCOSE: 174 mg/dL — AB (ref <117)

## 2013-08-31 LAB — BRAIN NATRIURETIC PEPTIDE: BRAIN NATRIURETIC PEPTIDE: 35 pg/mL (ref 0.0–100.0)

## 2013-09-03 ENCOUNTER — Other Ambulatory Visit: Payer: Self-pay | Admitting: Medical

## 2013-09-07 ENCOUNTER — Other Ambulatory Visit: Payer: Self-pay | Admitting: Medical

## 2013-09-07 MED ORDER — LISINOPRIL-HYDROCHLOROTHIAZIDE 20-25 MG PO TABS: 1.0000 | ORAL_TABLET | Freq: Every day | ORAL | Status: DC

## 2013-09-07 MED ORDER — HYDRALAZINE HCL 100 MG PO TABS: ORAL_TABLET | ORAL | Status: DC

## 2013-09-07 MED ORDER — CARVEDILOL 12.5 MG PO TABS: 12.5000 mg | ORAL_TABLET | Freq: Two times a day (BID) | ORAL | Status: DC

## 2013-09-13 ENCOUNTER — Ambulatory Visit: Payer: BC Managed Care – PPO | Admitting: Internal Medicine

## 2013-09-16 ENCOUNTER — Encounter: Payer: Self-pay | Admitting: Medical

## 2013-12-21 ENCOUNTER — Encounter: Payer: BC Managed Care – PPO | Admitting: Medical

## 2014-01-25 ENCOUNTER — Ambulatory Visit (INDEPENDENT_AMBULATORY_CARE_PROVIDER_SITE_OTHER): Payer: BC Managed Care – PPO | Admitting: Medical

## 2014-01-25 ENCOUNTER — Encounter: Payer: Self-pay | Admitting: Medical

## 2014-01-25 VITALS — BP 152/78 | HR 110 | Temp 98.2°F | Wt 207.0 lb

## 2014-01-25 DIAGNOSIS — E669 Obesity, unspecified: Secondary | ICD-10-CM

## 2014-01-25 DIAGNOSIS — J069 Acute upper respiratory infection, unspecified: Secondary | ICD-10-CM

## 2014-01-25 DIAGNOSIS — J3489 Other specified disorders of nose and nasal sinuses: Secondary | ICD-10-CM

## 2014-01-25 DIAGNOSIS — R059 Cough, unspecified: Secondary | ICD-10-CM

## 2014-01-25 DIAGNOSIS — R0981 Nasal congestion: Secondary | ICD-10-CM

## 2014-01-25 DIAGNOSIS — R000 Tachycardia, unspecified: Secondary | ICD-10-CM

## 2014-01-25 DIAGNOSIS — R05 Cough: Secondary | ICD-10-CM

## 2014-01-25 DIAGNOSIS — E119 Type 2 diabetes mellitus without complications: Secondary | ICD-10-CM

## 2014-01-25 DIAGNOSIS — I10 Essential (primary) hypertension: Secondary | ICD-10-CM

## 2014-01-25 MED ORDER — LISINOPRIL-HYDROCHLOROTHIAZIDE 20-25 MG PO TABS: 1.0000 | ORAL_TABLET | Freq: Every day | ORAL | Status: DC

## 2014-01-25 MED ORDER — CARVEDILOL 25 MG PO TABS: 25.0000 mg | ORAL_TABLET | Freq: Two times a day (BID) | ORAL | Status: DC

## 2014-01-25 MED ORDER — BENZONATATE 200 MG PO CAPS: 200.0000 mg | ORAL_CAPSULE | Freq: Three times a day (TID) | ORAL | Status: DC | PRN

## 2014-01-25 MED ORDER — METFORMIN HCL 1000 MG PO TABS: 1000.0000 mg | ORAL_TABLET | Freq: Every day | ORAL | Status: DC

## 2014-01-25 NOTE — Progress Notes (Signed)
Subjective:  [name] is a 48 y.o. female who presents for cough.  Symptoms include several days of sneezing, cough, head congestoin, a little chest congestion, coughing up phlegm sometimes, throat irritation from coughing.  Denies fever, nausea, vomiting, rash, ear pain.  No itchy eyes.   Treatment to date: none.  Daughter is sick contacts.  Daughter is 31yo.  She does not smoke.  Denies hx/o allergies.  No other aggravating or relieving factors.  No other c/o.  She is not checking BPs.  Is over due for f/u on diabetes, blood pressure. 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  The following portions of the patient's history were reviewed and updated as appropriate: allergies, current medications, past family history, past medical history, past social history, past surgical history and problem list.  ROS as in subjective  Past Medical History  Diagnosis Date  . Anemia   . Hypertension   . Diabetes mellitus   . Pneumonia   . Heart murmur   . CHF (congestive heart failure)      Objective: BP 152/78  Pulse 110  Temp(Src) 98.2 F (36.8 C) (Oral)  Wt 207 lb (93.895 kg)  SpO2 98%  BP Readings from Last 3 Encounters:  01/25/14 152/78  08/30/13 162/82  07/07/13 163/88     General appearance: Alert, WD/WN, no distress,mildly ill appearing                             Skin: warm, no rash, no diaphoresis                           Head: no sinus tenderness                            Eyes: conjunctiva normal, corneas clear, PERRLA                            Ears: pearly TMs, external ear canals normal                          Nose: septum midline, turbinates swollen, with erythema and clear discharge             Mouth/throat: MMM, tongue normal, mild pharyngeal erythema                           Neck: supple, no adenopathy, no thyromegaly, nontender                          Heart: RRR, normal S1, M6,2/9 brief systolic  murmur heard best in right upper sternal border, but heard upper chest in general, left and right                         Lungs: +bronchial breath sounds, no rhonchi, no wheezes, no rales                Extremities: no edema, nontender     Assessment: Encounter Diagnoses  Name Primary?  Marland Kitchen Upper respiratory infection Yes  . Cough   . Nasal congestion   . Essential hypertension, benign   .  Tachycardia   . Type II or unspecified type diabetes mellitus without mention of complication, not stated as uncontrolled   . Obesity, unspecified      Plan:  We discussed symptoms and exam suggesting upper respiratory infection. Discussed treatment recommendations.  Labs today given that she is due for followup and will be unable to come in fasting at a later date.  She will make a followup appointment for recheck on diabetes, high blood pressure.  I increased her Coreg to 25 mg twice daily, continue rest of medications as usual.  Specific recommendations today include:  For nasal congestion and drainage and sneezing begin sample of Nasonex 1 spray per nostril twice daily  Use Claritin over-the-counter in the morning, Benadryl at bedtime.  Or you can use Mucinex D and over-the-counter  Use the prescription Tessalon Perles for cough as needed  Consider nasal saline and saltwater gargles to clear out mucous in the morning  Drink more water than usual to flush out mucous  If not much improved or worse chest congestion by Friday, then call back  You are overdue for recheck on chronic issues and medications for diabetes and blood pressure  We checked labs today, please make a followup appointment within the next month for routine check on these issues   I Increased your Coreg blood pressure medication to 25 mg twice daily  F/u within 18mo.

## 2014-01-25 NOTE — Patient Instructions (Addendum)
  Thank you for giving me the opportunity to serve you today.    Your diagnosis today includes: Encounter Diagnoses  Name Primary?  Marland Kitchen Upper respiratory infection Yes  . Cough   . Nasal congestion   . Essential hypertension, benign   . Tachycardia   . Type II or unspecified type diabetes mellitus without mention of complication, not stated as uncontrolled   . Obesity, unspecified      Specific recommendations today include:  For nasal congestion and drainage and sneezing begin sample of Nasonex 1 spray per nostril twice daily  Use Claritin over-the-counter in the morning, Benadryl at bedtime.  Or you can use Mucinex D and over-the-counter  Use the prescription Tessalon Perles for cough as needed  Consider nasal saline and saltwater gargles to clear out mucous in the morning  Drink more water than usual to flush out mucous  If not much improved or worse chest congestion by Friday, then call back  You are overdue for recheck on chronic issues and medications for diabetes and blood pressure  We checked labs today, please make a followup appointment within the next month for routine check on these issues   I Increased your Coreg blood pressure medication to 25 mg twice daily   Return 51mo on diabetes, HTN.

## 2014-01-26 LAB — COMPREHENSIVE METABOLIC PANEL
ALT: 11 U/L (ref 0–35)
AST: 22 U/L (ref 0–37)
Albumin: 3.9 g/dL (ref 3.5–5.2)
Alkaline Phosphatase: 102 U/L (ref 39–117)
BUN: 15 mg/dL (ref 6–23)
CHLORIDE: 99 meq/L (ref 96–112)
CO2: 27 mEq/L (ref 19–32)
CREATININE: 0.69 mg/dL (ref 0.50–1.10)
Calcium: 9.1 mg/dL (ref 8.4–10.5)
Glucose, Bld: 307 mg/dL — ABNORMAL HIGH (ref 70–99)
Potassium: 3.9 mEq/L (ref 3.5–5.3)
Sodium: 136 mEq/L (ref 135–145)
Total Bilirubin: 0.2 mg/dL (ref 0.2–1.2)
Total Protein: 7.2 g/dL (ref 6.0–8.3)

## 2014-01-26 LAB — MICROALBUMIN / CREATININE URINE RATIO
Creatinine, Urine: 127.9 mg/dL
Microalb Creat Ratio: 17.1 mg/g (ref 0.0–30.0)
Microalb, Ur: 2.19 mg/dL — ABNORMAL HIGH (ref 0.00–1.89)

## 2014-01-26 LAB — TSH: TSH: 1.052 u[IU]/mL (ref 0.350–4.500)

## 2014-01-26 LAB — LIPID PANEL
Cholesterol: 155 mg/dL (ref 0–200)
HDL: 53 mg/dL (ref >39)
LDL CALC: 89 mg/dL (ref 0–99)
Total CHOL/HDL Ratio: 2.9 Ratio
Triglycerides: 63 mg/dL (ref <150)
VLDL: 13 mg/dL (ref 0–40)

## 2014-01-26 LAB — HEMOGLOBIN A1C
Hgb A1c MFr Bld: 9.1 % — ABNORMAL HIGH (ref <5.7)
Mean Plasma Glucose: 214 mg/dL — ABNORMAL HIGH (ref <117)

## 2014-01-26 LAB — T4, FREE: FREE T4: 1.08 ng/dL (ref 0.80–1.80)

## 2014-01-28 ENCOUNTER — Ambulatory Visit: Payer: BC Managed Care – PPO | Admitting: Medical

## 2014-02-22 ENCOUNTER — Ambulatory Visit (INDEPENDENT_AMBULATORY_CARE_PROVIDER_SITE_OTHER): Payer: BC Managed Care – PPO | Admitting: Medical

## 2014-02-22 ENCOUNTER — Encounter: Payer: Self-pay | Admitting: Medical

## 2014-02-22 VITALS — BP 170/100 | HR 96 | Temp 98.2°F | Resp 16 | Wt 206.0 lb

## 2014-02-22 DIAGNOSIS — E1165 Type 2 diabetes mellitus with hyperglycemia: Secondary | ICD-10-CM

## 2014-02-22 DIAGNOSIS — Z8679 Personal history of other diseases of the circulatory system: Secondary | ICD-10-CM

## 2014-02-22 DIAGNOSIS — D509 Iron deficiency anemia, unspecified: Secondary | ICD-10-CM

## 2014-02-22 DIAGNOSIS — Z23 Encounter for immunization: Secondary | ICD-10-CM

## 2014-02-22 DIAGNOSIS — I10 Essential (primary) hypertension: Secondary | ICD-10-CM

## 2014-02-22 DIAGNOSIS — IMO0002 Reserved for concepts with insufficient information to code with codable children: Secondary | ICD-10-CM

## 2014-02-22 MED ORDER — DAPAGLIFLOZIN PROPANEDIOL 5 MG PO TABS: 1.0000 | ORAL_TABLET | Freq: Every day | ORAL | Status: DC

## 2014-02-22 MED ORDER — SITAGLIPTIN PHOS-METFORMIN HCL 50-1000 MG PO TABS: 1.0000 | ORAL_TABLET | Freq: Two times a day (BID) | ORAL | Status: DC

## 2014-02-22 MED ORDER — OLMESARTAN-AMLODIPINE-HCTZ 40-5-25 MG PO TABS: 1.0000 | ORAL_TABLET | Freq: Every day | ORAL | Status: DC

## 2014-02-22 MED ORDER — CARVEDILOL 25 MG PO TABS: 25.0000 mg | ORAL_TABLET | Freq: Two times a day (BID) | ORAL | Status: DC

## 2014-02-22 MED ORDER — HYDRALAZINE HCL 100 MG PO TABS: 100.0000 mg | ORAL_TABLET | Freq: Three times a day (TID) | ORAL | Status: DC

## 2014-02-22 NOTE — Progress Notes (Signed)
Subjective: Here for recheck on medications and chronic issues  Diabetes type 2-Diagnosed with diabetes 20 years ago.   Prior medications is Metformin and Actos only.  Had been on Actos briefly in the past. Not checking glucose. Does try to eat healthier, less starches, more green vegetables.  Exercise - not on a regularly basis, but is moving often throughout the day.  HTN - since last visit in September has increased the Coreg to 25mg  BID. She reports her blood pressure has never been controlled. She is compliant with her medications at this time which include Coreg 25 twice daily that we increased last visit, hydralazine 100 mg, 1 tablet at breakfast and lunch, one half tablet at bedtime, lisinopril HCT. She notes that in the past Tribenzor work better for her blood pressure. She does not use added salt.  Last cardiology visit 2013.   She notes a long history of iron deficiency anemia, doesn't tolerate oral iron that well, has had several transfusions in the past, is feeling symptomatic again, fatigue, weak, requests an iron infusion if her counts are low. She does have a history of uterine fibroids.  No other c/o.  Objective: Filed Vitals:   02/22/14 1515  BP: 170/100  Pulse: 96  Temp: 98.2 F (36.8 C)  Resp: 16    General appearance: Alert, WD/WN, no distress Neck: supple, no adenopathy, no thyromegaly, nontender  Heart: RRR, normal S1, V6,7/0 brief systolic murmur heard best in right upper sternal border, but heard upper chest in general, left and right  Lungs: +bronchial breath sounds, no rhonchi, no wheezes, no rales  Extremities: no edema, nontender     Assessment: Encounter Diagnoses  Name Primary?  . Diabetes mellitus type 2, uncontrolled Yes  . Iron deficiency anemia   . Need for prophylactic vaccination and inoculation against influenza   . Essential hypertension   . History of CHF (congestive heart failure)    Plan: DM type 2 - not at goal.  She absolutely  refuses research study.   Hasn't tolerated higher doses of Metforin due to diarrhea.  Begin Wilder Glade once daily, begin Janumet 50/1000 twice daily. She refuses all injectable medications at this time, she refuses insulin at this time. Discussed diet and exercise that she needs to improve on, recheck in 28mo.   Iron deficiency anemia - recheck CBC, consider iron infusion  Counseled on the influenza virus vaccine.  Vaccine information sheet given.  Influenza vaccine given after consent obtained.  HTN, hx/o CHF - continue Coreg 25 mg twice daily,increase to Hydralazine 100mg , 1 tablet 3 times daily (increasing the night time dose from 1/2-1 tablet daily, stop lisinopril HCT, change to Tribenzor 40/5/25mg  daily as below.  She had done better with control on Tribenzor (amlodipine prior).  She reports last cardiology visit 2013? With possible unremarkable stress test at that time.  F/u 68mo.

## 2014-02-23 ENCOUNTER — Encounter: Payer: Self-pay | Admitting: Medical

## 2014-02-23 LAB — CBC WITH DIFFERENTIAL/PLATELET
BASOS ABS: 0.1 10*3/uL (ref 0.0–0.1)
Basophils Relative: 1 % (ref 0–1)
EOS ABS: 0.1 10*3/uL (ref 0.0–0.7)
EOS PCT: 1 % (ref 0–5)
HEMATOCRIT: 27.9 % — AB (ref 36.0–46.0)
Hemoglobin: 8 g/dL — ABNORMAL LOW (ref 12.0–15.0)
LYMPHS PCT: 34 % (ref 12–46)
Lymphs Abs: 2.9 10*3/uL (ref 0.7–4.0)
MCH: 18.3 pg — AB (ref 26.0–34.0)
MCHC: 28.7 g/dL — ABNORMAL LOW (ref 30.0–36.0)
MCV: 63.7 fL — ABNORMAL LOW (ref 78.0–100.0)
MONO ABS: 0.5 10*3/uL (ref 0.1–1.0)
Monocytes Relative: 6 % (ref 3–12)
Neutro Abs: 5 10*3/uL (ref 1.7–7.7)
Neutrophils Relative %: 58 % (ref 43–77)
Platelets: 469 10*3/uL — ABNORMAL HIGH (ref 150–400)
RBC: 4.38 MIL/uL (ref 3.87–5.11)
RDW: 19.1 % — ABNORMAL HIGH (ref 11.5–15.5)
WBC: 8.6 10*3/uL (ref 4.0–10.5)

## 2014-03-11 ENCOUNTER — Ambulatory Visit: Payer: BC Managed Care – PPO | Admitting: Medical

## 2014-03-23 ENCOUNTER — Other Ambulatory Visit (HOSPITAL_COMMUNITY): Payer: Self-pay | Admitting: *Deleted

## 2014-03-24 ENCOUNTER — Ambulatory Visit (HOSPITAL_COMMUNITY)
Admission: RE | Admit: 2014-03-24 | Discharge: 2014-03-24 | Disposition: A | Discharge status: Home / Self Care | Payer: BC Managed Care – PPO | Source: Ambulatory Visit | Attending: Medical | Admitting: Medical

## 2014-03-24 DIAGNOSIS — D509 Iron deficiency anemia, unspecified: Secondary | ICD-10-CM | POA: Insufficient documentation

## 2014-03-24 MED ORDER — SODIUM CHLORIDE 0.9 % IV SOLN
510.0000 mg | Freq: Once | INTRAVENOUS | Status: AC
  Administered 2014-03-24: 510 mg via INTRAVENOUS
  Filled 2014-03-24: qty 17

## 2014-03-24 NOTE — Discharge Instructions (Signed)

## 2014-03-30 ENCOUNTER — Ambulatory Visit: Payer: BC Managed Care – PPO | Admitting: Medical

## 2014-04-22 ENCOUNTER — Encounter: Payer: Self-pay | Admitting: Medical

## 2014-04-26 ENCOUNTER — Encounter (HOSPITAL_COMMUNITY): Payer: Self-pay | Admitting: Emergency Medicine

## 2014-04-26 ENCOUNTER — Emergency Department (HOSPITAL_COMMUNITY): Payer: BC Managed Care – PPO

## 2014-04-26 ENCOUNTER — Emergency Department (INDEPENDENT_AMBULATORY_CARE_PROVIDER_SITE_OTHER)
Admission: EM | Admit: 2014-04-26 | Discharge: 2014-04-26 | Disposition: A | Discharge status: Nursing Home (Includes ICF) | Payer: BC Managed Care – PPO | Source: Home / Self Care | Attending: Family Medicine | Admitting: Family Medicine

## 2014-04-26 ENCOUNTER — Encounter (HOSPITAL_COMMUNITY): Payer: Self-pay | Admitting: *Deleted

## 2014-04-26 ENCOUNTER — Emergency Department (HOSPITAL_COMMUNITY)
Admission: EM | Admit: 2014-04-26 | Discharge: 2014-04-27 | Disposition: A | Discharge status: Home / Self Care | Payer: BC Managed Care – PPO | Attending: Emergency Medicine | Admitting: Emergency Medicine

## 2014-04-26 DIAGNOSIS — R519 Headache, unspecified: Secondary | ICD-10-CM

## 2014-04-26 DIAGNOSIS — R011 Cardiac murmur, unspecified: Secondary | ICD-10-CM | POA: Insufficient documentation

## 2014-04-26 DIAGNOSIS — E119 Type 2 diabetes mellitus without complications: Secondary | ICD-10-CM | POA: Insufficient documentation

## 2014-04-26 DIAGNOSIS — R51 Headache: Secondary | ICD-10-CM | POA: Insufficient documentation

## 2014-04-26 DIAGNOSIS — Z8701 Personal history of pneumonia (recurrent): Secondary | ICD-10-CM | POA: Insufficient documentation

## 2014-04-26 DIAGNOSIS — D539 Nutritional anemia, unspecified: Secondary | ICD-10-CM | POA: Diagnosis not present

## 2014-04-26 DIAGNOSIS — I10 Essential (primary) hypertension: Secondary | ICD-10-CM | POA: Diagnosis not present

## 2014-04-26 DIAGNOSIS — E876 Hypokalemia: Secondary | ICD-10-CM | POA: Insufficient documentation

## 2014-04-26 DIAGNOSIS — Z3202 Encounter for pregnancy test, result negative: Secondary | ICD-10-CM | POA: Insufficient documentation

## 2014-04-26 DIAGNOSIS — Z79899 Other long term (current) drug therapy: Secondary | ICD-10-CM | POA: Insufficient documentation

## 2014-04-26 DIAGNOSIS — H538 Other visual disturbances: Secondary | ICD-10-CM | POA: Diagnosis not present

## 2014-04-26 DIAGNOSIS — H539 Unspecified visual disturbance: Secondary | ICD-10-CM

## 2014-04-26 DIAGNOSIS — I509 Heart failure, unspecified: Secondary | ICD-10-CM | POA: Diagnosis not present

## 2014-04-26 DIAGNOSIS — D649 Anemia, unspecified: Secondary | ICD-10-CM

## 2014-04-26 LAB — COMPREHENSIVE METABOLIC PANEL
ALK PHOS: 128 U/L — AB (ref 39–117)
ALT: 8 U/L (ref 0–35)
ANION GAP: 12 (ref 5–15)
AST: 16 U/L (ref 0–37)
Albumin: 3.8 g/dL (ref 3.5–5.2)
BUN: 16 mg/dL (ref 6–23)
CO2: 24 mEq/L (ref 19–32)
Calcium: 9.4 mg/dL (ref 8.4–10.5)
Chloride: 101 mEq/L (ref 96–112)
Creatinine, Ser: 0.68 mg/dL (ref 0.50–1.10)
GFR calc Af Amer: >90 mL/min (ref >90)
GFR calc non Af Amer: >90 mL/min (ref >90)
Glucose, Bld: 150 mg/dL — ABNORMAL HIGH (ref 70–99)
POTASSIUM: 3.6 meq/L — AB (ref 3.7–5.3)
Sodium: 137 mEq/L (ref 137–147)
Total Bilirubin: <0.2 mg/dL — ABNORMAL LOW (ref 0.3–1.2)
Total Protein: 8.3 g/dL (ref 6.0–8.3)

## 2014-04-26 LAB — CBC WITH DIFFERENTIAL/PLATELET
BASOS ABS: 0 10*3/uL (ref 0.0–0.1)
BASOS PCT: 1 % (ref 0–1)
EOS ABS: 0.1 10*3/uL (ref 0.0–0.7)
Eosinophils Relative: 1 % (ref 0–5)
HCT: 33.3 % — ABNORMAL LOW (ref 36.0–46.0)
HEMOGLOBIN: 9.7 g/dL — AB (ref 12.0–15.0)
Lymphocytes Relative: 40 % (ref 12–46)
Lymphs Abs: 2.9 10*3/uL (ref 0.7–4.0)
MCH: 20 pg — ABNORMAL LOW (ref 26.0–34.0)
MCHC: 29.1 g/dL — AB (ref 30.0–36.0)
MCV: 68.7 fL — ABNORMAL LOW (ref 78.0–100.0)
MONOS PCT: 7 % (ref 3–12)
Monocytes Absolute: 0.5 10*3/uL (ref 0.1–1.0)
NEUTROS PCT: 52 % (ref 43–77)
Neutro Abs: 3.8 10*3/uL (ref 1.7–7.7)
PLATELETS: 310 10*3/uL (ref 150–400)
RBC: 4.85 MIL/uL (ref 3.87–5.11)
RDW: 24.6 % — AB (ref 11.5–15.5)
WBC: 7.3 10*3/uL (ref 4.0–10.5)

## 2014-04-26 LAB — URINE MICROSCOPIC-ADD ON

## 2014-04-26 LAB — URINALYSIS, ROUTINE W REFLEX MICROSCOPIC
Bilirubin Urine: NEGATIVE
Hgb urine dipstick: NEGATIVE
Ketones, ur: NEGATIVE mg/dL
LEUKOCYTES UA: NEGATIVE
NITRITE: NEGATIVE
PH: 5.5 (ref 5.0–8.0)
Protein, ur: NEGATIVE mg/dL
SPECIFIC GRAVITY, URINE: 1.036 — AB (ref 1.005–1.030)
Urobilinogen, UA: 0.2 mg/dL (ref 0.0–1.0)

## 2014-04-26 LAB — PREGNANCY, URINE: PREG TEST UR: NEGATIVE

## 2014-04-26 MED ORDER — AMLODIPINE BESYLATE 5 MG PO TABS: 5.0000 mg | ORAL_TABLET | Freq: Every day | ORAL | Status: DC

## 2014-04-26 MED ORDER — TETRACAINE HCL 0.5 % OP SOLN
2.0000 [drp] | Freq: Once | OPHTHALMIC | Status: AC
  Administered 2014-04-27: 2 [drp] via OPHTHALMIC
  Filled 2014-04-26: qty 2

## 2014-04-26 MED ORDER — HYDROCHLOROTHIAZIDE 25 MG PO TABS: 25.0000 mg | ORAL_TABLET | Freq: Every day | ORAL | Status: DC

## 2014-04-26 MED ORDER — OLMESARTAN MEDOXOMIL 40 MG PO TABS: 40.0000 mg | ORAL_TABLET | Freq: Every day | ORAL | Status: DC

## 2014-04-26 MED ORDER — IBUPROFEN 800 MG PO TABS
800.0000 mg | ORAL_TABLET | Freq: Once | ORAL | Status: AC
  Administered 2014-04-26: 800 mg via ORAL
  Filled 2014-04-26: qty 1

## 2014-04-26 MED ORDER — HYDROCHLOROTHIAZIDE 25 MG PO TABS
25.0000 mg | ORAL_TABLET | Freq: Every day | ORAL | Status: DC
  Administered 2014-04-26: 25 mg via ORAL
  Filled 2014-04-26: qty 1

## 2014-04-26 MED ORDER — IRBESARTAN 300 MG PO TABS
300.0000 mg | ORAL_TABLET | Freq: Every day | ORAL | Status: DC
  Administered 2014-04-26: 300 mg via ORAL
  Filled 2014-04-26: qty 1

## 2014-04-26 MED ORDER — AMLODIPINE BESYLATE 5 MG PO TABS
5.0000 mg | ORAL_TABLET | Freq: Once | ORAL | Status: AC
  Administered 2014-04-26: 5 mg via ORAL
  Filled 2014-04-26: qty 1

## 2014-04-26 NOTE — ED Notes (Signed)
Pt in c/o headache and pressure behind her ears for the last few days, intermittent dizziness, pt seen at urgent care and sent here due to hypertension

## 2014-04-26 NOTE — Discharge Instructions (Signed)
Read the information below.  Use the prescribed medication as directed.  Please discuss all new medications with your pharmacist.  You may return to the Emergency Department at any time for worsening condition or any new symptoms that concern you.   If you develop worsening pain in your eye, change in your vision, swelling around your eye, difficulty moving your eye, or fevers greater than 100.4, see your eye doctor or return to the Emergency Department immediately for a recheck.   Please monitor your blood pressures and blood sugars closely and take your medications as directed.  Please see your primary care provider within one week for a recheck.   You are having a headache. No specific cause was found today for your headache. It may have been a migraine or other cause of headache. Stress, anxiety, fatigue, and depression are common triggers for headaches. Your headache today does not appear to be life-threatening or require hospitalization, but often the exact cause of headaches is not determined in the emergency department. Therefore, follow-up with your doctor is very important to find out what may have caused your headache, and whether or not you need any further diagnostic testing or treatment. Sometimes headaches can appear benign (not harmful), but then more serious symptoms can develop which should prompt an immediate re-evaluation by your doctor or the emergency department. SEEK MEDICAL ATTENTION IF: You develop possible problems with medications prescribed.  The medications don't resolve your headache, if it recurs , or if you have multiple episodes of vomiting or can't take fluids. You have a change from the usual headache. RETURN IMMEDIATELY IF you develop a sudden, severe headache or confusion, become poorly responsive or faint, develop a fever above 100.42F or problem breathing, have a change in speech, vision, swallowing, or understanding, or develop new weakness, numbness, tingling,  incoordination, or have a seizure.    General Headache Without Cause A headache is pain or discomfort felt around the head or neck area. The specific cause of a headache may not be found. There are many causes and types of headaches. A few common ones are:  Tension headaches.  Migraine headaches.  Cluster headaches.  Chronic daily headaches. HOME CARE INSTRUCTIONS   Keep all follow-up appointments with your caregiver or any specialist referral.  Only take over-the-counter or prescription medicines for pain or discomfort as directed by your caregiver.  Lie down in a dark, quiet room when you have a headache.  Keep a headache journal to find out what may trigger your migraine headaches. For example, write down:  What you eat and drink.  How much sleep you get.  Any change to your diet or medicines.  Try massage or other relaxation techniques.  Put ice packs or heat on the head and neck. Use these 3 to 4 times per day for 15 to 20 minutes each time, or as needed.  Limit stress.  Sit up straight, and do not tense your muscles.  Quit smoking if you smoke.  Limit alcohol use.  Decrease the amount of caffeine you drink, or stop drinking caffeine.  Eat and sleep on a regular schedule.  Get 7 to 9 hours of sleep, or as recommended by your caregiver.  Keep lights dim if bright lights bother you and make your headaches worse. SEEK MEDICAL CARE IF:   You have problems with the medicines you were prescribed.  Your medicines are not working.  You have a change from the usual headache.  You have nausea or vomiting.  SEEK IMMEDIATE MEDICAL CARE IF:   Your headache becomes severe.  You have a fever.  You have a stiff neck.  You have loss of vision.  You have muscular weakness or loss of muscle control.  You start losing your balance or have trouble walking.  You feel faint or pass out.  You have severe symptoms that are different from your first symptoms. MAKE  SURE YOU:   Understand these instructions.  Will watch your condition.  Will get help right away if you are not doing well or get worse. Document Released: 04/29/2005 Document Revised: 07/22/2011 Document Reviewed: 05/15/2011 [facility] Patient Information 2015 [place], [place]. This information is not intended to replace advice given to you by your health care provider. Make sure you discuss any questions you have with your health care provider.  Hypertension Hypertension, commonly called high blood pressure, is when the force of blood pumping through your arteries is too strong. Your arteries are the blood vessels that carry blood from your heart throughout your body. A blood pressure reading consists of a higher number over a lower number, such as 110/72. The higher number (systolic) is the pressure inside your arteries when your heart pumps. The lower number (diastolic) is the pressure inside your arteries when your heart relaxes. Ideally you want your blood pressure below 120/80. Hypertension forces your heart to work harder to pump blood. Your arteries may become narrow or stiff. Having hypertension puts you at risk for heart disease, stroke, and other problems.  RISK FACTORS Some risk factors for high blood pressure are controllable. Others are not.  Risk factors you cannot control include:   Race. You may be at higher risk if you are African American.  Age. Risk increases with age.  Gender. Men are at higher risk than women before age 42 years. After age 64, women are at higher risk than men. Risk factors you can control include:  Not getting enough exercise or physical activity.  Being overweight.  Getting too much fat, sugar, calories, or salt in your diet.  Drinking too much alcohol. SIGNS AND SYMPTOMS Hypertension does not usually cause signs or symptoms. Extremely high blood pressure (hypertensive crisis) may cause headache, anxiety, shortness of breath, and  nosebleed. DIAGNOSIS  To check if you have hypertension, your health care provider will measure your blood pressure while you are seated, with your arm held at the level of your heart. It should be measured at least twice using the same arm. Certain conditions can cause a difference in blood pressure between your right and left arms. A blood pressure reading that is higher than normal on one occasion does not mean that you need treatment. If one blood pressure reading is high, ask your health care provider about having it checked again. TREATMENT  Treating high blood pressure includes making lifestyle changes and possibly taking medicine. Living a healthy lifestyle can help lower high blood pressure. You may need to change some of your habits. Lifestyle changes may include:  Following the DASH diet. This diet is high in fruits, vegetables, and whole grains. It is low in salt, red meat, and added sugars.  Getting at least 2 hours of brisk physical activity every week.  Losing weight if necessary.  Not smoking.  Limiting alcoholic beverages.  Learning ways to reduce stress. If lifestyle changes are not enough to get your blood pressure under control, your health care provider may prescribe medicine. You may need to take more than one. Work closely with  your health care provider to understand the risks and benefits. HOME CARE INSTRUCTIONS  Have your blood pressure rechecked as directed by your health care provider.   Take medicines only as directed by your health care provider. Follow the directions carefully. Blood pressure medicines must be taken as prescribed. The medicine does not work as well when you skip doses. Skipping doses also puts you at risk for problems.   Do not smoke.   Monitor your blood pressure at home as directed by your health care provider. SEEK MEDICAL CARE IF:   You think you are having a reaction to medicines taken.  You have recurrent headaches or feel  dizzy.  You have swelling in your ankles.  You have trouble with your vision. SEEK IMMEDIATE MEDICAL CARE IF:  You develop a severe headache or confusion.  You have unusual weakness, numbness, or feel faint.  You have severe chest or abdominal pain.  You vomit repeatedly.  You have trouble breathing. MAKE SURE YOU:   Understand these instructions.  Will watch your condition.  Will get help right away if you are not doing well or get worse. Document Released: 04/29/2005 Document Revised: 09/13/2013 Document Reviewed: 02/19/2013 [facility] Patient Information 2015 [place], [place]. This information is not intended to replace advice given to you by your health care provider. Make sure you discuss any questions you have with your health care provider.  Visual Disturbances You have had a disturbance in your vision. This may be caused by various conditions, such as:  Migraines. Migraine headaches are often preceded by a disturbance in vision. Blind spots or light flashes are followed by a headache. This type of visual disturbance is temporary. It does not damage the eye.  Glaucoma. This is caused by increased pressure in the eye. Symptoms include haziness, blurred vision, or seeing rainbow colored circles when looking at bright lights. Partial or complete visual loss can occur. You may or may not experience eye pain. Visual loss may be gradual or sudden and is irreversible. Glaucoma is the leading cause of blindness.  Retina problems. Vision will be reduced if the retina becomes detached or if there is a circulation problem as with diabetes, high blood pressure, or a mini-stroke. Symptoms include seeing "floaters," flashes of light, or shadows, as if a curtain has fallen over your eye.  Optic nerve problems. The main nerve in your eye can be damaged by redness, soreness, and swelling (inflammation), poor circulation, drugs, and toxins. It is very important to have a complete exam done by a  specialist to determine the exact cause of your eye problem. The specialist may recommend medicines or surgery, depending on the cause of the problem. This can help prevent further loss of vision or reduce the risk of having a stroke. Contact the caregiver to whom you have been referred and arrange for follow-up care right away. SEEK IMMEDIATE MEDICAL CARE IF:   Your vision gets worse.  You develop severe headaches.  You have any weakness or numbness in the face, arms, or legs.  You have any trouble speaking or walking. Document Released: 06/06/2004 Document Revised: 07/22/2011 Document Reviewed: 09/27/2009 [facility] Patient Information 2015 [place], [place]. This information is not intended to replace advice given to you by your health care provider. Make sure you discuss any questions you have with your health care provider.

## 2014-04-26 NOTE — ED Provider Notes (Signed)
Gwendoline YMANI PORCHER is a 48 y.o. female who presents to Urgent Care today for hypertension and headache. Patient ran out of her blood pressure medicine today. She developed severe headache with vision changes. She additionally feels lightheaded. Additionally she notes urinary frequency and urgency. She denies any significant chest pain or shortness of breath.   Past Medical History  Diagnosis Date  . Anemia   . Hypertension   . Diabetes mellitus   . Pneumonia   . Heart murmur   . CHF (congestive heart failure)    Past Surgical History  Procedure Laterality Date  . Cesarean section    . Tubal ligation     History  Substance Use Topics  . Smoking status: Never Smoker   . Smokeless tobacco: Never Used  . Alcohol Use: Yes     Comment: occasional   ROS as above Medications: No current facility-administered medications for this encounter.   Current Outpatient Prescriptions  Medication Sig Dispense Refill  . carvedilol (COREG) 25 MG tablet Take 1 tablet (25 mg total) by mouth 2 (two) times daily with a meal. 60 tablet 2  . Dapagliflozin Propanediol (FARXIGA) 5 MG TABS Take 1 tablet by mouth daily. 30 tablet 5  . ferrous sulfate 325 (65 FE) MG tablet Take 1 tablet (325 mg total) by mouth 2 (two) times daily with a meal. 60 tablet 0  . hydrALAZINE (APRESOLINE) 100 MG tablet Take 1 tablet (100 mg total) by mouth 3 (three) times daily. 90 tablet 5  . metFORMIN (GLUCOPHAGE) 1000 MG tablet Take 1 tablet (1,000 mg total) by mouth daily with breakfast. 30 tablet 1  . Olmesartan-Amlodipine-HCTZ 40-5-25 MG TABS Take 1 tablet by mouth daily. 30 tablet 5  . sitaGLIPtin-metformin (JANUMET) 50-1000 MG per tablet Take 1 tablet by mouth 2 (two) times daily with a meal. 60 tablet 5   No Known Allergies   Exam:  BP 226/89 mmHg  Pulse 89  Temp(Src) 98.3 F (36.8 C) (Oral)  Resp 16  SpO2 99% Gen: Well NAD HEENT: EOMI,  MMM PERRL Lungs: Normal work of breathing. CTABL Heart: RRR no MRG Abd: NABS,  Soft. Nondistended, Nontender Exts: Brisk capillary refill, warm and well perfused.  Neuro: Alert and oriented, coordination intact normal grip strength and sensation normal gait and balance.  No results found for this or any previous visit (from the past 24 hour(s)). No results found.  Assessment and Plan: 48 y.o. female with malignant hypertension. Patient has significantly elevated blood pressure with headache and vision changes. Transfer to the emergency department via shuttle for evaluation and management.  Discussed warning signs or symptoms. Please see discharge instructions. Patient expresses understanding.     Gregor Hams, MD 04/26/14 Carollee Massed

## 2014-04-26 NOTE — ED Notes (Signed)
Patient c/o headache onset last week. She reports she feels fatigued, light headed and sensitive to sound. Patient reports she does take medication for her hypertension and diabetes but ran out yesterday. She is seeing "swirlies" and feels lightheaded.

## 2014-04-26 NOTE — ED Provider Notes (Signed)
CSN: [id number]     Arrival date & time 04/26/14  1825 History   First MD Initiated Contact with Patient 04/26/14 2035     Chief Complaint  Patient presents with  . Headache     (Consider location/radiation/quality/duration/timing/severity/associated sxs/prior Treatment) The history is provided by the patient, the spouse and medical records.     Pt sent from [facility] urgent care for uncontrolled HTN with headache and vision changes. Pt has known HTN, has been spacing out her medication because it is expensive, ran out yesterday.  Developed a headache over the weekend, gradual onset, located across forehead and in occiput, described as a tight band, 5/10 intensity.  She has taken nothing for her pain.  This is not the worst headache of her life but she does not get headaches regularly.  Has also felt "out of it," described as tired, groggy, no energy.  Has had urinary frequency.  Yesterday developed occasional white wavy lines in her vision, worse in left eye.  She has had this before but not recently.  Her left eye has also had clear tearing and has been red.   Denies vision loss.    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  She has also spent the weekend taking care of her husband and child who have been sick with cough/cold, husband diagnosed with bronchitis.   Denies CP, SOB, neck stiffness, head trauma, weakness or numbness of the extremities, change in speech or gait, confusion, disorientation, diplopia, other focal neurologic deficits.    Eye doctor- Dr Katy Fitch    Past Medical History  Diagnosis Date  . Anemia   . Hypertension   . Diabetes mellitus   . Pneumonia   . Heart murmur   . CHF (congestive heart failure)    Past Surgical History  Procedure Laterality Date  . Cesarean section    . Tubal ligation     Family History  Problem  Relation Age of Onset  . Hypertension Mother   . Hypertension Brother    History  Substance Use Topics  . Smoking status: Never Smoker   . Smokeless tobacco: Never Used  . Alcohol Use: Yes     Comment: occasional   OB History    No data available     Review of Systems  All other systems reviewed and are negative.     Allergies  Review of patient's allergies indicates no known allergies.  Home Medications   Prior to Admission medications   Medication Sig Start Date End Date Taking? Authorizing Provider  carvedilol (COREG) 25 MG tablet Take 1 tablet (25 mg total) by mouth 2 (two) times daily with a meal. 02/22/14   Camelia Eng Tysinger, PA-C  Dapagliflozin Propanediol (FARXIGA) 5 MG TABS Take 1 tablet by mouth daily. 02/22/14   Camelia Eng Tysinger, PA-C  ferrous sulfate 325 (65 FE) MG tablet Take 1 tablet (325 mg total) by mouth 2 (two) times daily with a meal. 08/19/13   Tresa Garter, MD  hydrALAZINE (APRESOLINE) 100 MG tablet Take 1 tablet (100 mg total) by mouth 3 (three) times daily. 02/22/14   Camelia Eng Tysinger, PA-C  metFORMIN (GLUCOPHAGE) 1000 MG tablet Take 1 tablet (1,000 mg total) by mouth daily with breakfast. 01/25/14   Camelia Eng Tysinger, PA-C  Olmesartan-Amlodipine-HCTZ 40-5-25 MG TABS Take 1 tablet by mouth daily.  02/22/14   Camelia Eng Tysinger, PA-C  sitaGLIPtin-metformin (JANUMET) 50-1000 MG per tablet Take 1 tablet by mouth 2 (two) times daily with a meal. 02/22/14   Camelia Eng Tysinger, PA-C   BP 169/92 mmHg  Pulse 75  Temp(Src) 98 F (36.7 C) (Oral)  Resp 16  Ht 5\' 4"  (1.626 m)  Wt 200 lb (90.719 kg)  BMI 34.31 kg/m2  SpO2 100%  LMP 04/11/2014 Physical Exam  Constitutional: She appears well-developed and well-nourished. No distress.  HENT:  Head: Normocephalic and atraumatic.  Mouth/Throat: Oropharynx is clear and moist.  Eyes: Conjunctivae and EOM are normal.  Fundoscopic exam:      The right eye shows no hemorrhage and no papilledema.       The left eye  shows no hemorrhage and no papilledema.  Limited fundoscopic exam is unremarkable  Neck: Neck supple.  Cardiovascular: Normal rate and regular rhythm.   Pulmonary/Chest: Effort normal and breath sounds normal. No respiratory distress. She has no wheezes. She has no rales.  Abdominal: Soft. She exhibits mass (suprapubic). She exhibits no distension. There is no tenderness. There is no rebound and no guarding.  Neurological: She is alert.  CN II-XII intact, EOMs intact, no pronator drift, grip strengths equal bilaterally; strength 5/5 in all extremities, sensation intact in all extremities; finger to nose, heel to shin, rapid alternating movements normal; gait is normal.     Skin: She is not diaphoretic.  Psychiatric: She has a normal mood and affect. Her behavior is normal.  Nursing note and vitals reviewed.  Pressure, left eye 12, right eye 21.   ED Course  Procedures (including critical care time) Labs Review Labs Reviewed  CBC WITH DIFFERENTIAL - Abnormal; Notable for the following:    Hemoglobin 9.7 (*)    HCT 33.3 (*)    MCV 68.7 (*)    MCH 20.0 (*)    MCHC 29.1 (*)    RDW 24.6 (*)    All other components within normal limits  COMPREHENSIVE METABOLIC PANEL - Abnormal; Notable for the following:    Potassium 3.6 (*)    Glucose, Bld 150 (*)    Alkaline Phosphatase 128 (*)    Total Bilirubin <0.2 (*)    All other components within normal limits  URINALYSIS, ROUTINE W REFLEX MICROSCOPIC - Abnormal; Notable for the following:    Specific Gravity, Urine 1.036 (*)    Glucose, UA >1000 (*)    All other components within normal limits  URINE MICROSCOPIC-ADD ON - Abnormal; Notable for the following:    Squamous Epithelial / LPF FEW (*)    All other components within normal limits  PREGNANCY, URINE    Imaging Review Ct Head Wo Contrast  04/26/2014   CLINICAL DATA:  Headache with onset of symptoms last week. Fatigue. Lightheadedness. Hypertension and diabetes. Initial encounter.   EXAM: CT HEAD WITHOUT CONTRAST  TECHNIQUE: Contiguous axial images were obtained from the base of the skull through the vertex without intravenous contrast.  COMPARISON:  None.  FINDINGS: No mass lesion, mass effect, midline shift, hydrocephalus, hemorrhage. No territorial ischemia or acute infarction. Hypoplastic frontal sinuses bilaterally. Mastoid air cells are normal.  IMPRESSION: Negative CT head.   Electronically Signed   By: Dereck Ligas M.D.   On: 04/26/2014 22:57     EKG Interpretation None       11:18 PM Discussed pt and plan with Dr Leonides Schanz.    Filed Vitals:   04/26/14 2245  BP: 174/90  Pulse: 83  Temp:   Resp:      Pt sleeping comfortably in the room prior to discharge, headache improved with ibuprofen (this is the only medication she would accept for her headache)    MDM   Final diagnoses:  Essential hypertension  Acute nonintractable headache, unspecified headache type  Visual disturbance    Afebrile, nontoxic patient with uncontrolled HTN, headache, visual disturbance.  HTN improved without treatment prior to being seen in ED then improved more with dose of home medications (approximation of home medications).  Headache is without red flags.  Head CT negative.  Eye exam, though limited in ED, is unremarkable.  Pressures are normal.   D/C home with HTN medications (pt unable to afford combination medication, will write for each component individually), PCP and eye doctor follow up.  Pt made aware of the dangers of high blood pressure and the importance for close follow up and compliance with medications.  Note - suprapubic mass c/w patient's known diagnosis of uterine fibroids. Discussed result, findings, treatment, and follow up  with patient.  Pt given return precautions.  Pt verbalizes understanding and agrees with plan.         Clayton Bibles, PA-C 04/27/14 [place], DO 04/27/14 0786

## 2014-04-27 ENCOUNTER — Ambulatory Visit (INDEPENDENT_AMBULATORY_CARE_PROVIDER_SITE_OTHER): Payer: BC Managed Care – PPO | Admitting: Medical

## 2014-04-27 ENCOUNTER — Encounter: Payer: Self-pay | Admitting: Medical

## 2014-04-27 ENCOUNTER — Telehealth: Payer: Self-pay | Admitting: Medical

## 2014-04-27 VITALS — BP 138/88 | HR 84 | Temp 98.1°F | Resp 16 | Wt 205.0 lb

## 2014-04-27 DIAGNOSIS — E1165 Type 2 diabetes mellitus with hyperglycemia: Secondary | ICD-10-CM

## 2014-04-27 DIAGNOSIS — Z566 Other physical and mental strain related to work: Secondary | ICD-10-CM

## 2014-04-27 DIAGNOSIS — F39 Unspecified mood [affective] disorder: Secondary | ICD-10-CM

## 2014-04-27 DIAGNOSIS — R4586 Emotional lability: Secondary | ICD-10-CM

## 2014-04-27 DIAGNOSIS — F430 Acute stress reaction: Secondary | ICD-10-CM

## 2014-04-27 DIAGNOSIS — IMO0002 Reserved for concepts with insufficient information to code with codable children: Secondary | ICD-10-CM

## 2014-04-27 DIAGNOSIS — I10 Essential (primary) hypertension: Secondary | ICD-10-CM

## 2014-04-27 LAB — POCT GLYCOSYLATED HEMOGLOBIN (HGB A1C): Hemoglobin A1C: 7.8

## 2014-04-27 MED ORDER — POTASSIUM CHLORIDE CRYS ER 20 MEQ PO TBCR
20.0000 meq | EXTENDED_RELEASE_TABLET | Freq: Once | ORAL | Status: AC
  Administered 2014-04-27: 20 meq via ORAL
  Filled 2014-04-27: qty 1

## 2014-04-27 NOTE — Telephone Encounter (Signed)
I gave all of the patients information to [facility] office and they said they would contact the patient

## 2014-04-27 NOTE — Telephone Encounter (Signed)
Lmom for [name] to cb. CLS  I fax over a referral to [facility] and they stated they would call her to set up the appointment. CLS

## 2014-04-27 NOTE — Telephone Encounter (Signed)
pls refer to therapist/mental health provider ASAP for acute stress reaction, work stress, help with potential leave off absence, [place] for [place] office [email or web address] [address], [place], [place], [place] [address]  Rema Fendt, therapist  Toy Cookey, MA, clinical psychologist  Cognitive-Behavior Therapy; Mood Disorders; Anxiety Disorders; adult and child ADHD; Family Therapy; Stress Management; personal growth, and Marital Therapy.    Terrance Mass Ph.D., clinical psychologist Cognitive-Behavior Therapy; Mood Disorders; Anxiety Disorders; Stress     Management   [facility] [address] [place], [place] [address] [phone]   [facility] [place], psychotherapist [address] [place], [place] [address] [phone]   Karin Golden Ph.D., clinical psychologist [phone] office [place], [place] [address] Cognitive Behavior Therapy, Depression, Bipolar, Anxiety, Grief and Loss

## 2014-04-27 NOTE — Progress Notes (Signed)
Subjective: She is here for several concerns. Accompanied by her husband and child. Her last visit here was in October for routine diabetes and blood pressure follow-up. At that time we made several medication changes which she has been compliant with.  She is not checking her blood sugars.  Her main concern today involves work stress.  She works as a Agricultural engineer. Lately there has been a lot of stress at work including stress dealing with a very demanding boss who seems to be placing more and more workload on her to the point that is overwhelming.  Several of her coworkers have quit.  She feels that she is being unfairly treated at work.  One example was that she called in sick Monday due to severe headache and not feeling well, and her employer ended up calling her 4 times even though she was homesick.  She is requesting me to write her out of work to help her have room to breathing to consider her current situation.  She is also worried about her physical health as her blood pressures have been running really high, she has been having severe headaches which prompted the emergency department visit 2 days ago.  She feels that the longer she stays in her current work environment with the pressures on her she will have worsening blood pressures and she is fearful for other complications.  He does not feel that she can go to work at this time.  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  She even went to the [place] to discuss these concerns and was told that she would have to just work something out with her employer.  No other aggravating or relieving factors. No other complaint.  Past Medical History  Diagnosis Date  . Anemia   . Hypertension   . Diabetes mellitus   . Pneumonia   . Heart murmur   . CHF (congestive heart failure)    Review of systems as in subjective   Objective: Gen: wd,  wn, nad Psych: tearful at times, seems anxious, seems frustrated  Assessment Encounter Diagnoses  Name Primary?  . Acute stress reaction Yes  . Stress at work   . Mood changes   . Essential hypertension   . Type II diabetes mellitus, uncontrolled    Plan: Had a long discussion about her concerns and at times she didn't seem to agree with my recommendations or feel I was listening to her.   I made it clear several times that if she feels the need to take a leave of absence she will need to see a mental health provider to help her work through these issues and to help determine the amount of time needed out of work.  We will make a referral to mental health  HTN - continue current medications for now- Coreg 25 mg twice daily, hydralazine 100 mg 3 times a day, Tribenzor 40/5/25 mg daily  Diabetes type 2-HgbA1C 7.8% today, improved from last visit.  Continue Farxiga once daily, continue Janumet 50/1000 milligrams twice daily  Follow-up pending call back

## 2014-05-10 ENCOUNTER — Telehealth: Payer: Self-pay | Admitting: Family Medicine

## 2014-05-10 NOTE — Telephone Encounter (Signed)
PATIENT HAS AN APPOINTMENT  [facility] [address] [place], [place]   05/18/2014 @ 230 PM   PATIENT IS AWARE OF HER APPOINTMENT

## 2014-05-18 ENCOUNTER — Telehealth: Payer: Self-pay | Admitting: Medical

## 2014-05-18 NOTE — Telephone Encounter (Signed)
Spoke with psychologist Nanette Tulelake with [facility] who saw [name] in reference to stress and request for FMLA.  We discussed her visit here in 04/2014.   She will work with patient to find a solution and will be working with her FMLA request.

## 2014-07-26 ENCOUNTER — Ambulatory Visit: Payer: BC Managed Care – PPO | Attending: Internal Medicine | Admitting: Internal Medicine

## 2014-07-26 ENCOUNTER — Encounter: Payer: Self-pay | Admitting: Internal Medicine

## 2014-07-26 VITALS — BP 150/80 | HR 69 | Temp 98.8°F | Resp 15 | Wt 213.0 lb

## 2014-07-26 DIAGNOSIS — E119 Type 2 diabetes mellitus without complications: Secondary | ICD-10-CM | POA: Diagnosis present

## 2014-07-26 DIAGNOSIS — I10 Essential (primary) hypertension: Secondary | ICD-10-CM

## 2014-07-26 DIAGNOSIS — E139 Other specified diabetes mellitus without complications: Secondary | ICD-10-CM

## 2014-07-26 DIAGNOSIS — Z1231 Encounter for screening mammogram for malignant neoplasm of breast: Secondary | ICD-10-CM

## 2014-07-26 LAB — POCT GLYCOSYLATED HEMOGLOBIN (HGB A1C): Hemoglobin A1C: 7.7

## 2014-07-26 LAB — GLUCOSE, POCT (MANUAL RESULT ENTRY): POC Glucose: 131 mg/dl — AB (ref 70–99)

## 2014-07-26 MED ORDER — OLMESARTAN-AMLODIPINE-HCTZ 40-5-25 MG PO TABS: 1.0000 | ORAL_TABLET | Freq: Every day | ORAL | Status: DC

## 2014-07-26 MED ORDER — CARVEDILOL 25 MG PO TABS: 25.0000 mg | ORAL_TABLET | Freq: Two times a day (BID) | ORAL | Status: DC

## 2014-07-26 MED ORDER — CLONIDINE HCL 0.1 MG PO TABS
0.1000 mg | ORAL_TABLET | Freq: Once | ORAL | Status: AC
  Administered 2014-07-26: 0.1 mg via ORAL

## 2014-07-26 MED ORDER — METFORMIN HCL 1000 MG PO TABS: 1000.0000 mg | ORAL_TABLET | Freq: Two times a day (BID) | ORAL | Status: DC

## 2014-07-26 NOTE — Progress Notes (Signed)
Patient here for follow up on her diabetes and blood pressure Presents if office with elevated blood pressure

## 2014-07-26 NOTE — Patient Instructions (Signed)
Diabetes Mellitus and Food It is important for you to manage your blood sugar (glucose) level. Your blood glucose level can be greatly affected by what you eat. Eating healthier foods in the appropriate amounts throughout the day at about the same time each day will help you control your blood glucose level. It can also help slow or prevent worsening of your diabetes mellitus. Healthy eating may even help you improve the level of your blood pressure and reach or maintain a healthy weight.  HOW CAN FOOD AFFECT ME? Carbohydrates Carbohydrates affect your blood glucose level more than any other type of food. Your dietitian will help you determine how many carbohydrates to eat at each meal and teach you how to count carbohydrates. Counting carbohydrates is important to keep your blood glucose at a healthy level, especially if you are using insulin or taking certain medicines for diabetes mellitus. Alcohol Alcohol can cause sudden decreases in blood glucose (hypoglycemia), especially if you use insulin or take certain medicines for diabetes mellitus. Hypoglycemia can be a life-threatening condition. Symptoms of hypoglycemia (sleepiness, dizziness, and disorientation) are similar to symptoms of having too much alcohol.  If your health care provider has given you approval to drink alcohol, do so in moderation and use the following guidelines:  Women should not have more than one drink per day, and men should not have more than two drinks per day. One drink is equal to:  12 oz of beer.  5 oz of wine.  1 oz of hard liquor.  Do not drink on an empty stomach.  Keep yourself hydrated. Have water, diet soda, or unsweetened iced tea.  Regular soda, juice, and other mixers might contain a lot of carbohydrates and should be counted. WHAT FOODS ARE NOT RECOMMENDED? As you make food choices, it is important to remember that all foods are not the same. Some foods have fewer nutrients per serving than other  foods, even though they might have the same number of calories or carbohydrates. It is difficult to get your body what it needs when you eat foods with fewer nutrients. Examples of foods that you should avoid that are high in calories and carbohydrates but low in nutrients include:  Trans fats (most processed foods list trans fats on the Nutrition Facts label).  Regular soda.  Juice.  Candy.  Sweets, such as cake, pie, doughnuts, and cookies.  Fried foods. WHAT FOODS CAN I EAT? Have nutrient-rich foods, which will nourish your body and keep you healthy. The food you should eat also will depend on several factors, including:  The calories you need.  The medicines you take.  Your weight.  Your blood glucose level.  Your blood pressure level.  Your cholesterol level. You also should eat a variety of foods, including:  Protein, such as meat, poultry, fish, tofu, nuts, and seeds (lean animal proteins are best).  Fruits.  Vegetables.  Dairy products, such as milk, cheese, and yogurt (low fat is best).  Breads, grains, pasta, cereal, rice, and beans.  Fats such as olive oil, trans fat-free margarine, canola oil, avocado, and olives. DOES EVERYONE WITH DIABETES MELLITUS HAVE THE SAME MEAL PLAN? Because every person with diabetes mellitus is different, there is not one meal plan that works for everyone. It is very important that you meet with a dietitian who will help you create a meal plan that is just right for you. Document Released: 01/24/2005 Document Revised: 05/04/2013 Document Reviewed: 03/26/2013 ExitCare Patient Information 2015 [employer]. This   information is not intended to replace advice given to you by your health care provider. Make sure you discuss any questions you have with your health care provider. DASH Eating Plan DASH stands for "Dietary Approaches to Stop Hypertension." The DASH eating plan is a healthy eating plan that has been shown to reduce high  blood pressure (hypertension). Additional health benefits may include reducing the risk of type 2 diabetes mellitus, heart disease, and stroke. The DASH eating plan may also help with weight loss. WHAT DO I NEED TO KNOW ABOUT THE DASH EATING PLAN? For the DASH eating plan, you will follow these general guidelines:  Choose foods with a percent daily value for sodium of less than 5% (as listed on the food label).  Use salt-free seasonings or herbs instead of table salt or sea salt.  Check with your health care provider or pharmacist before using salt substitutes.  Eat lower-sodium products, often labeled as "lower sodium" or "no salt added."  Eat fresh foods.  Eat more vegetables, fruits, and low-fat dairy products.  Choose whole grains. Look for the word "whole" as the first word in the ingredient list.  Choose fish and skinless chicken or turkey more often than red meat. Limit fish, poultry, and meat to 6 oz (170 g) each day.  Limit sweets, desserts, sugars, and sugary drinks.  Choose heart-healthy fats.  Limit cheese to 1 oz (28 g) per day.  Eat more home-cooked food and less restaurant, buffet, and fast food.  Limit fried foods.  Cook foods using methods other than frying.  Limit canned vegetables. If you do use them, rinse them well to decrease the sodium.  When eating at a restaurant, ask that your food be prepared with less salt, or no salt if possible. WHAT FOODS CAN I EAT? Seek help from a dietitian for individual calorie needs. Grains Whole grain or whole wheat bread. Brown rice. Whole grain or whole wheat pasta. Quinoa, bulgur, and whole grain cereals. Low-sodium cereals. Corn or whole wheat flour tortillas. Whole grain cornbread. Whole grain crackers. Low-sodium crackers. Vegetables Fresh or frozen vegetables (raw, steamed, roasted, or grilled). Low-sodium or reduced-sodium tomato and vegetable juices. Low-sodium or reduced-sodium tomato sauce and paste. Low-sodium  or reduced-sodium canned vegetables.  Fruits All fresh, canned (in natural juice), or frozen fruits. Meat and Other Protein Products Ground beef (85% or leaner), grass-fed beef, or beef trimmed of fat. Skinless chicken or turkey. Ground chicken or turkey. Pork trimmed of fat. All fish and seafood. Eggs. Dried beans, peas, or lentils. Unsalted nuts and seeds. Unsalted canned beans. Dairy Low-fat dairy products, such as skim or 1% milk, 2% or reduced-fat cheeses, low-fat ricotta or cottage cheese, or plain low-fat yogurt. Low-sodium or reduced-sodium cheeses. Fats and Oils Tub margarines without trans fats. Light or reduced-fat mayonnaise and salad dressings (reduced sodium). Avocado. Safflower, olive, or canola oils. Natural peanut or almond butter. Other Unsalted popcorn and pretzels. The items listed above may not be a complete list of recommended foods or beverages. Contact your dietitian for more options. WHAT FOODS ARE NOT RECOMMENDED? Grains White bread. White pasta. White rice. Refined cornbread. Bagels and croissants. Crackers that contain trans fat. Vegetables Creamed or fried vegetables. Vegetables in a cheese sauce. Regular canned vegetables. Regular canned tomato sauce and paste. Regular tomato and vegetable juices. Fruits Dried fruits. Canned fruit in light or heavy syrup. Fruit juice. Meat and Other Protein Products Fatty cuts of meat. Ribs, chicken wings, bacon, sausage, bologna, salami, chitterlings, fatback, hot   dogs, bratwurst, and packaged luncheon meats. Salted nuts and seeds. Canned beans with salt. Dairy Whole or 2% milk, cream, half-and-half, and cream cheese. Whole-fat or sweetened yogurt. Full-fat cheeses or blue cheese. Nondairy creamers and whipped toppings. Processed cheese, cheese spreads, or cheese curds. Condiments Onion and garlic salt, seasoned salt, table salt, and sea salt. Canned and packaged gravies. Worcestershire sauce. Tartar sauce. Barbecue sauce.  Teriyaki sauce. Soy sauce, including reduced sodium. Steak sauce. Fish sauce. Oyster sauce. Cocktail sauce. Horseradish. Ketchup and mustard. Meat flavorings and tenderizers. Bouillon cubes. Hot sauce. Tabasco sauce. Marinades. Taco seasonings. Relishes. Fats and Oils Butter, stick margarine, lard, shortening, ghee, and bacon fat. Coconut, palm kernel, or palm oils. Regular salad dressings. Other Pickles and olives. Salted popcorn and pretzels. The items listed above may not be a complete list of foods and beverages to avoid. Contact your dietitian for more information. WHERE CAN I FIND MORE INFORMATION? National Heart, Lung, and Blood Institute: www.nhlbi.nih.gov/health/health-topics/topics/dash/ Document Released: 04/18/2011 Document Revised: 09/13/2013 Document Reviewed: 03/03/2013 ExitCare Patient Information 2015 [employer]. This information is not intended to replace advice given to you by your health care provider. Make sure you discuss any questions you have with your health care provider.  

## 2014-07-26 NOTE — Progress Notes (Signed)
MRN: [id number] Name: [name]  Sex: female Age: 49 y.o. DOB: 1965/10/31  Allergies: Review of patient's allergies indicates no known allergies.  Chief Complaint  Patient presents with  . Follow-up    HPI: Patient is 49 y.o. female who was seen in our office almost a year ago, patient has been following up with a different provider and was on several different medications, as per patient she used to be on [facility] which she discontinued almost a month ago because as per patient it was causing her more urinary infections, currently only taking metformin thousand milligram daily, she denies any hypoglycemic symptoms, she does not check her blood sugar regularly I have advised patient to keep the fingerstick log, her blood pressure is elevated, she has not been taking her medications as prescribed, she is only taking combination of irbesartan/amlodipine/hydrochlorothiazide, apparently she was also prescribed Coreg in the past as well as hydralazine, she is not taking these medications regularly, she was given clonidine in the office and her repeat manual blood pressure is improved.patient denies any headache dizziness chest and shortness of breath. Patient is requesting referral to see a dietitian.  Past Medical History  Diagnosis Date  . Anemia   . Hypertension   . Diabetes mellitus   . Pneumonia   . Heart murmur   . CHF (congestive heart failure)     Past Surgical History  Procedure Laterality Date  . Cesarean section    . Tubal ligation        Medication List       This list is accurate as of: 07/26/14  5:39 PM.  Always use your most recent med list.               amLODipine 5 MG tablet  Commonly known as:  NORVASC  Take 1 tablet (5 mg total) by mouth daily.     carvedilol 25 MG tablet  Commonly known as:  COREG  Take 1 tablet (25 mg total) by mouth 2 (two) times daily with a meal.     dapagliflozin propanediol 5 MG Tabs tablet  Commonly known as:  FARXIGA    Take 1 tablet by mouth daily.     ferrous sulfate 325 (65 FE) MG tablet  Take 1 tablet (325 mg total) by mouth 2 (two) times daily with a meal.     hydrALAZINE 100 MG tablet  Commonly known as:  APRESOLINE  Take 1 tablet (100 mg total) by mouth 3 (three) times daily.     hydrochlorothiazide 25 MG tablet  Commonly known as:  HYDRODIURIL  Take 1 tablet (25 mg total) by mouth daily.     metFORMIN 1000 MG tablet  Commonly known as:  GLUCOPHAGE  Take 1 tablet (1,000 mg total) by mouth 2 (two) times daily with a meal.     olmesartan 40 MG tablet  Commonly known as:  BENICAR  Take 1 tablet (40 mg total) by mouth daily.     Olmesartan-Amlodipine-HCTZ 40-5-25 MG Tabs  Take 1 tablet by mouth daily.     sitaGLIPtin-metformin 50-1000 MG per tablet  Commonly known as:  JANUMET  Take 1 tablet by mouth 2 (two) times daily with a meal.        Meds ordered this encounter  Medications  . cloNIDine (CATAPRES) tablet 0.1 mg    Sig:   . Olmesartan-Amlodipine-HCTZ 40-5-25 MG TABS    Sig: Take 1 tablet by mouth daily.    Dispense:  30 tablet  Refill:  5  . metFORMIN (GLUCOPHAGE) 1000 MG tablet    Sig: Take 1 tablet (1,000 mg total) by mouth 2 (two) times daily with a meal.    Dispense:  60 tablet    Refill:  3  . carvedilol (COREG) 25 MG tablet    Sig: Take 1 tablet (25 mg total) by mouth 2 (two) times daily with a meal.    Dispense:  60 tablet    Refill:  3    Immunization History  Administered Date(s) Administered  . Influenza Split 05/18/2012  . Influenza,inj,Quad PF,36+ Mos 02/08/2013, 02/22/2014    Family History  Problem Relation Age of Onset  . Hypertension Mother   . Hypertension Brother     History  Substance Use Topics  . Smoking status: Never Smoker   . Smokeless tobacco: Never Used  . Alcohol Use: Yes     Comment: occasional    Review of Systems   As noted in HPI  Filed Vitals:   07/26/14 1652  BP: 150/80  Pulse:   Temp:   Resp:     Physical  Exam  Physical Exam  Constitutional: No distress.  Eyes: EOM are normal. Pupils are equal, round, and reactive to light.  Cardiovascular: Normal rate and regular rhythm.   Pulmonary/Chest: Breath sounds normal. No respiratory distress. She has no wheezes. She has no rales.  Abdominal: Soft. There is no tenderness. There is no rebound.  Musculoskeletal: She exhibits no edema.    CBC    Component Value Date/Time   WBC 7.3 04/26/2014 1953   RBC 4.85 04/26/2014 1953   RBC 3.87 05/06/2012 1645   HGB 9.7* 04/26/2014 1953   HCT 33.3* 04/26/2014 1953   PLT 310 04/26/2014 1953   MCV 68.7* 04/26/2014 1953   LYMPHSABS 2.9 04/26/2014 1953   MONOABS 0.5 04/26/2014 1953   EOSABS 0.1 04/26/2014 1953   BASOSABS 0.0 04/26/2014 1953    CMP     Component Value Date/Time   NA 137 04/26/2014 1953   K 3.6* 04/26/2014 1953   CL 101 04/26/2014 1953   CO2 24 04/26/2014 1953   GLUCOSE 150* 04/26/2014 1953   BUN 16 04/26/2014 1953   CREATININE 0.68 04/26/2014 1953   CREATININE 0.69 01/25/2014 0927   CALCIUM 9.4 04/26/2014 1953   PROT 8.3 04/26/2014 1953   ALBUMIN 3.8 04/26/2014 1953   AST 16 04/26/2014 1953   ALT 8 04/26/2014 1953   ALKPHOS 128* 04/26/2014 1953   BILITOT <0.2* 04/26/2014 1953   GFRNONAA >90 04/26/2014 1953   GFRAA >90 04/26/2014 1953    Lab Results  Component Value Date/Time   CHOL 155 01/25/2014 09:27 AM    No components found for: HGA1C  Lab Results  Component Value Date/Time   AST 16 04/26/2014 07:53 PM    Assessment and Plan  Other specified diabetes mellitus without complications - Plan:  Results for orders placed or performed in visit on 07/26/14  Glucose (CBG)  Result Value Ref Range   POC Glucose 131 (A) 70 - 99 mg/dl  HgB A1c  Result Value Ref Range   Hemoglobin A1C 7.70    Hemoglobin A1c has slightly improved compared to last visit, currently patient is taking metformin 1000 mg daily, I have increased the dose to 1 g twice a day, advised patient  to keep the fingerstick log, advised patient for diabetes panic, will repeat, HgB A1c in 3 months,metFORMIN (GLUCOPHAGE) 1000 MG tablet, Ambulatory referral to diabetic education. Patient is also  advised to make a followup appointment with ophthalmology.  Essential hypertension - Plan: blood pressure is uncontrolled, advise patient for DASH diet, continue with Olmesartan-Amlodipine-HCTZ 40-5-25 MG TABS, and carvedilol (COREG) 25 MG tablet,patient is currently not taking hydralazine, COMPLETE METABOLIC PANEL WITH GFR,    Encounter for screening mammogram for breast cancer - Plan: MM DIGITAL SCREENING BILATERAL   Health Maintenance  -Mammogram:ordered -Vaccinations:  Up-to-date with flu shot.  Return in about 3 months (around 10/26/2014) for diabetes, hypertension, BP check in 2 weeks/Nurse Visit.   This note has been created with Surveyor, quantity. Any transcriptional errors are unintentional.    Lorayne Marek, MD

## 2014-07-27 ENCOUNTER — Telehealth: Payer: Self-pay

## 2014-07-27 LAB — COMPLETE METABOLIC PANEL WITH GFR
ALT: 10 U/L (ref 0–35)
AST: 20 U/L (ref 0–37)
Albumin: 4.1 g/dL (ref 3.5–5.2)
Alkaline Phosphatase: 103 U/L (ref 39–117)
BUN: 21 mg/dL (ref 6–23)
CALCIUM: 9.6 mg/dL (ref 8.4–10.5)
CO2: 30 mEq/L (ref 19–32)
Chloride: 100 mEq/L (ref 96–112)
Creat: 0.78 mg/dL (ref 0.50–1.10)
GFR, Est African American: >89 mL/min
GFR, Est Non African American: >89 mL/min
GLUCOSE: 129 mg/dL — AB (ref 70–99)
POTASSIUM: 4.3 meq/L (ref 3.5–5.3)
Sodium: 138 mEq/L (ref 135–145)
Total Bilirubin: 0.2 mg/dL (ref 0.2–1.2)
Total Protein: 7.7 g/dL (ref 6.0–8.3)

## 2014-07-27 NOTE — Telephone Encounter (Signed)
-----   Message from Lorayne Marek, MD sent at 07/27/2014  9:49 AM EDT ----- Blood work reviewed call and let the patient know except for borderline elevated blood sugar level rest of blood chemistry is  normal.

## 2014-07-27 NOTE — Telephone Encounter (Signed)
Patient not available Left message on voice mail to return our call 

## 2014-08-01 ENCOUNTER — Telehealth: Payer: Self-pay | Admitting: Internal Medicine

## 2014-08-01 NOTE — Telephone Encounter (Signed)
Pt called is returning nurse's phone call about results, pt states you can leave results on vm or with husband. Please contact pt at [phone]

## 2014-08-08 ENCOUNTER — Telehealth: Payer: Self-pay

## 2014-08-08 NOTE — Telephone Encounter (Signed)
Returned patient phone call Patient is aware of her lab results

## 2014-08-22 ENCOUNTER — Ambulatory Visit: Payer: BC Managed Care – PPO | Attending: Internal Medicine | Admitting: *Deleted

## 2014-08-22 VITALS — BP 129/79 | HR 70 | Temp 97.9°F | Resp 20

## 2014-08-22 DIAGNOSIS — I10 Essential (primary) hypertension: Secondary | ICD-10-CM | POA: Insufficient documentation

## 2014-08-22 DIAGNOSIS — Z0130 Encounter for examination of blood pressure without abnormal findings: Secondary | ICD-10-CM

## 2014-08-22 NOTE — Patient Instructions (Signed)
DASH Eating Plan °DASH stands for "Dietary Approaches to Stop Hypertension." The DASH eating plan is a healthy eating plan that has been shown to reduce high blood pressure (hypertension). Additional health benefits may include reducing the risk of type 2 diabetes mellitus, heart disease, and stroke. The DASH eating plan may also help with weight loss. °WHAT DO I NEED TO KNOW ABOUT THE DASH EATING PLAN? °For the DASH eating plan, you will follow these general guidelines: °· Choose foods with a percent daily value for sodium of less than 5% (as listed on the food label). °· Use salt-free seasonings or herbs instead of table salt or sea salt. °· Check with your health care provider or pharmacist before using salt substitutes. °· Eat lower-sodium products, often labeled as "lower sodium" or "no salt added." °· Eat fresh foods. °· Eat more vegetables, fruits, and low-fat dairy products. °· Choose whole grains. Look for the word "whole" as the first word in the ingredient list. °· Choose fish and skinless chicken or turkey more often than red meat. Limit fish, poultry, and meat to 6 oz (170 g) each day. °· Limit sweets, desserts, sugars, and sugary drinks. °· Choose heart-healthy fats. °· Limit cheese to 1 oz (28 g) per day. °· Eat more home-cooked food and less restaurant, buffet, and fast food. °· Limit fried foods. °· Cook foods using methods other than frying. °· Limit canned vegetables. If you do use them, rinse them well to decrease the sodium. °· When eating at a restaurant, ask that your food be prepared with less salt, or no salt if possible. °WHAT FOODS CAN I EAT? °Seek help from a dietitian for individual calorie needs. °Grains °Whole grain or whole wheat bread. Brown rice. Whole grain or whole wheat pasta. Quinoa, bulgur, and whole grain cereals. Low-sodium cereals. Corn or whole wheat flour tortillas. Whole grain cornbread. Whole grain crackers. Low-sodium crackers. °Vegetables °Fresh or frozen vegetables  (raw, steamed, roasted, or grilled). Low-sodium or reduced-sodium tomato and vegetable juices. Low-sodium or reduced-sodium tomato sauce and paste. Low-sodium or reduced-sodium canned vegetables.  °Fruits °All fresh, canned (in natural juice), or frozen fruits. °Meat and Other Protein Products °Ground beef (85% or leaner), grass-fed beef, or beef trimmed of fat. Skinless chicken or turkey. Ground chicken or turkey. Pork trimmed of fat. All fish and seafood. Eggs. Dried beans, peas, or lentils. Unsalted nuts and seeds. Unsalted canned beans. °Dairy °Low-fat dairy products, such as skim or 1% milk, 2% or reduced-fat cheeses, low-fat ricotta or cottage cheese, or plain low-fat yogurt. Low-sodium or reduced-sodium cheeses. °Fats and Oils °Tub margarines without trans fats. Light or reduced-fat mayonnaise and salad dressings (reduced sodium). Avocado. Safflower, olive, or canola oils. Natural peanut or almond butter. °Other °Unsalted popcorn and pretzels. °The items listed above may not be a complete list of recommended foods or beverages. Contact your dietitian for more options. °WHAT FOODS ARE NOT RECOMMENDED? °Grains °White bread. White pasta. White rice. Refined cornbread. Bagels and croissants. Crackers that contain trans fat. °Vegetables °Creamed or fried vegetables. Vegetables in a cheese sauce. Regular canned vegetables. Regular canned tomato sauce and paste. Regular tomato and vegetable juices. °Fruits °Dried fruits. Canned fruit in light or heavy syrup. Fruit juice. °Meat and Other Protein Products °Fatty cuts of meat. Ribs, chicken wings, bacon, sausage, bologna, salami, chitterlings, fatback, hot dogs, bratwurst, and packaged luncheon meats. Salted nuts and seeds. Canned beans with salt. °Dairy °Whole or 2% milk, cream, half-and-half, and cream cheese. Whole-fat or sweetened yogurt. Full-fat   cheeses or blue cheese. Nondairy creamers and whipped toppings. Processed cheese, cheese spreads, or cheese  curds. °Condiments °Onion and garlic salt, seasoned salt, table salt, and sea salt. Canned and packaged gravies. Worcestershire sauce. Tartar sauce. Barbecue sauce. Teriyaki sauce. Soy sauce, including reduced sodium. Steak sauce. Fish sauce. Oyster sauce. Cocktail sauce. Horseradish. Ketchup and mustard. Meat flavorings and tenderizers. Bouillon cubes. Hot sauce. Tabasco sauce. Marinades. Taco seasonings. Relishes. °Fats and Oils °Butter, stick margarine, lard, shortening, ghee, and bacon fat. Coconut, palm kernel, or palm oils. Regular salad dressings. °Other °Pickles and olives. Salted popcorn and pretzels. °The items listed above may not be a complete list of foods and beverages to avoid. Contact your dietitian for more information. °WHERE CAN I FIND MORE INFORMATION? °National [employer], [employer], [employer] Institute: www.nhlbi.nih.gov/health/health-topics/topics/dash/ °Document Released: 04/18/2011 Document Revised: 09/13/2013 Document Reviewed: 03/03/2013 °Exit[employer]® Patient Information ©2015 [employer]. This information is not intended to replace advice given to you by your health care provider. Make sure you discuss any questions you have with your health care provider. ° °

## 2014-08-22 NOTE — Progress Notes (Signed)
Patient presents for BP check Med list reviewed; states taking all meds as directed Patient is not adding salt to foods or cooking with salt. Encouraged patient to choose foods with 5% or less of daily value for sodium. Patient states she eats out a lot and this is where she's getting salt. Patient walking 15 minutes per day and working out 3 times per week Patient denies headaches, blurred vision, SHOB, chest pain or pressure  BP 129/79 P 70 R 20  T  97.9 oral SPO2  100%  Patient advised to call for med refills at least 7 days before running out so as not to go without.  Patient aware that she is to f/u with PCP 3 months from last visit (Due 10/26/14)  Patient given literature on DASH Eating Plan

## 2014-09-26 LAB — HM DIABETES EYE EXAM

## 2014-10-24 ENCOUNTER — Encounter: Payer: Self-pay | Admitting: Internal Medicine

## 2014-11-03 ENCOUNTER — Encounter: Payer: Self-pay | Admitting: Internal Medicine

## 2014-11-03 ENCOUNTER — Ambulatory Visit: Payer: BC Managed Care – PPO | Attending: Internal Medicine | Admitting: Internal Medicine

## 2014-11-03 VITALS — BP 140/70 | HR 91 | Temp 98.6°F | Resp 16 | Wt 212.0 lb

## 2014-11-03 DIAGNOSIS — I10 Essential (primary) hypertension: Secondary | ICD-10-CM

## 2014-11-03 DIAGNOSIS — E119 Type 2 diabetes mellitus without complications: Secondary | ICD-10-CM | POA: Diagnosis not present

## 2014-11-03 DIAGNOSIS — Z9114 Patient's other noncompliance with medication regimen: Secondary | ICD-10-CM | POA: Diagnosis not present

## 2014-11-03 DIAGNOSIS — Z1231 Encounter for screening mammogram for malignant neoplasm of breast: Secondary | ICD-10-CM

## 2014-11-03 DIAGNOSIS — D509 Iron deficiency anemia, unspecified: Secondary | ICD-10-CM

## 2014-11-03 DIAGNOSIS — I509 Heart failure, unspecified: Secondary | ICD-10-CM | POA: Insufficient documentation

## 2014-11-03 DIAGNOSIS — E139 Other specified diabetes mellitus without complications: Secondary | ICD-10-CM

## 2014-11-03 LAB — POCT GLYCOSYLATED HEMOGLOBIN (HGB A1C): Hemoglobin A1C: 8.8

## 2014-11-03 MED ORDER — CARVEDILOL 25 MG PO TABS: 25.0000 mg | ORAL_TABLET | Freq: Two times a day (BID) | ORAL | Status: DC

## 2014-11-03 MED ORDER — OLMESARTAN-AMLODIPINE-HCTZ 40-5-25 MG PO TABS: 1.0000 | ORAL_TABLET | Freq: Every day | ORAL | Status: DC

## 2014-11-03 MED ORDER — METFORMIN HCL 1000 MG PO TABS: 1000.0000 mg | ORAL_TABLET | Freq: Two times a day (BID) | ORAL | Status: DC

## 2014-11-03 NOTE — Progress Notes (Signed)
MRN: [id number] Name: [name]  Sex: female Age: 49 y.o. DOB: [birth year]  Allergies: Review of patient's allergies indicates no known allergies.  Chief Complaint  Patient presents with  . Follow-up    HPI: Patient is 49 y.o. female who has to of diabetes hypertension, anemia comes today for followup , as per patient she has not been compliant in taking her metformin twice, she currently denies any headache dizziness chest and shortness of breath today her blood pressure is borderline elevated, she's requesting refill on her medications,patient denies any hypoglycemic symptoms, her hemoglobin A1c has trended up, patient is complaining of feeling tired, has been taking her iron supplements 2 times a day she reports history of having menstrual periods.  Past Medical History  Diagnosis Date  . Anemia   . Hypertension   . Diabetes mellitus   . Pneumonia   . Heart murmur   . CHF (congestive heart failure)     Past Surgical History  Procedure Laterality Date  . Cesarean section    . Tubal ligation        Medication List       This list is accurate as of: 11/03/14  1:00 PM.  Always use your most recent med list.               carvedilol 25 MG tablet  Commonly known as:  COREG  Take 1 tablet (25 mg total) by mouth 2 (two) times daily with a meal.     ferrous sulfate 325 (65 FE) MG tablet  Take 1 tablet (325 mg total) by mouth 2 (two) times daily with a meal.     metFORMIN 1000 MG tablet  Commonly known as:  GLUCOPHAGE  Take 1 tablet (1,000 mg total) by mouth 2 (two) times daily with a meal.     Olmesartan-Amlodipine-HCTZ 40-5-25 MG Tabs  Take 1 tablet by mouth daily.        Meds ordered this encounter  Medications  . carvedilol (COREG) 25 MG tablet    Sig: Take 1 tablet (25 mg total) by mouth 2 (two) times daily with a meal.    Dispense:  60 tablet    Refill:  5  . metFORMIN (GLUCOPHAGE) 1000 MG tablet    Sig: Take 1 tablet (1,000 mg total) by mouth 2  (two) times daily with a meal.    Dispense:  60 tablet    Refill:  5  . Olmesartan-Amlodipine-HCTZ 40-5-25 MG TABS    Sig: Take 1 tablet by mouth daily.    Dispense:  30 tablet    Refill:  5    Immunization History  Administered Date(s) Administered  . Influenza Split 05/18/2012  . Influenza,inj,Quad PF,36+ Mos 02/08/2013, 02/22/2014    Family History  Problem Relation Age of Onset  . Hypertension Mother   . Hypertension Brother     History  Substance Use Topics  . Smoking status: Never Smoker   . Smokeless tobacco: Never Used  . Alcohol Use: Yes     Comment: occasional    Review of Systems   As noted in HPI  Filed Vitals:   11/03/14 1252  BP: 140/70  Pulse:   Temp:   Resp:     Physical Exam  Physical Exam  Constitutional: No distress.  Eyes: EOM are normal. Pupils are equal, round, and reactive to light.  Cardiovascular: Normal rate and regular rhythm.   Pulmonary/Chest: Breath sounds normal. No respiratory distress. She has no wheezes. She  has no rales.  Musculoskeletal: She exhibits no edema.    CBC    Component Value Date/Time   WBC 7.3 04/26/2014 1953   RBC 4.85 04/26/2014 1953   RBC 3.87 05/06/2012 1645   HGB 9.7* 04/26/2014 1953   HCT 33.3* 04/26/2014 1953   PLT 310 04/26/2014 1953   MCV 68.7* 04/26/2014 1953   LYMPHSABS 2.9 04/26/2014 1953   MONOABS 0.5 04/26/2014 1953   EOSABS 0.1 04/26/2014 1953   BASOSABS 0.0 04/26/2014 1953    CMP     Component Value Date/Time   NA 138 07/26/2014 1702   K 4.3 07/26/2014 1702   CL 100 07/26/2014 1702   CO2 30 07/26/2014 1702   GLUCOSE 129* 07/26/2014 1702   BUN 21 07/26/2014 1702   CREATININE 0.78 07/26/2014 1702   CREATININE 0.68 04/26/2014 1953   CALCIUM 9.6 07/26/2014 1702   PROT 7.7 07/26/2014 1702   ALBUMIN 4.1 07/26/2014 1702   AST 20 07/26/2014 1702   ALT 10 07/26/2014 1702   ALKPHOS 103 07/26/2014 1702   BILITOT 0.2 07/26/2014 1702   GFRNONAA >89 07/26/2014 1702   GFRNONAA >90  04/26/2014 1953   GFRAA >89 07/26/2014 1702   GFRAA >90 04/26/2014 1953    Lab Results  Component Value Date/Time   CHOL 155 01/25/2014 09:27 AM    Lab Results  Component Value Date/Time   HGBA1C 8.80 11/03/2014 12:39 PM   HGBA1C 9.1* 01/25/2014 09:27 AM    Lab Results  Component Value Date/Time   AST 20 07/26/2014 05:02 PM    Assessment and Plan  Other specified diabetes mellitus without complications - Plan:  Results for orders placed or performed in visit on 11/03/14  HgB A1c  Result Value Ref Range   Hemoglobin A1C 8.80    Hemoglobin A1c has trended up, advised patient for diabetes meal planning, compliance and taking metformin twice a day, will recheck HgB A1c in 3 months, metFORMIN (GLUCOPHAGE) 1000 MG tablet  Essential hypertension - Plan:blood pressure is borderline elevated, advise patient for DASH diet, continue with  carvedilol (COREG) 25 MG tablet, COMPLETE METABOLIC PANEL WITH GFR, Olmesartan-Amlodipine-HCTZ 40-5-25 MG TABS  Anemia, iron deficiency - Plan: patient is taking iron supplement 2 times a day, recheck CBC with Differential/Platelet, Anemia panel  Encounter for screening mammogram for breast cancer - Plan: MM DIGITAL SCREENING BILATERAL   Health Maintenance  -Mammogram:ordered   Return in about 3 months (around 02/03/2015), or if symptoms worsen or fail to improve.   This note has been created with Surveyor, quantity. Any transcriptional errors are unintentional.    Lorayne Marek, MD

## 2014-11-03 NOTE — Patient Instructions (Signed)
DASH Eating Plan DASH stands for "Dietary Approaches to Stop Hypertension." The DASH eating plan is a healthy eating plan that has been shown to reduce high blood pressure (hypertension). Additional health benefits may include reducing the risk of type 2 diabetes mellitus, heart disease, and stroke. The DASH eating plan may also help with weight loss. WHAT DO I NEED TO KNOW ABOUT THE DASH EATING PLAN? For the DASH eating plan, you will follow these general guidelines:  Choose foods with a percent daily value for sodium of less than 5% (as listed on the food label).  Use salt-free seasonings or herbs instead of table salt or sea salt.  Check with your health care provider or pharmacist before using salt substitutes.  Eat lower-sodium products, often labeled as "lower sodium" or "no salt added."  Eat fresh foods.  Eat more vegetables, fruits, and low-fat dairy products.  Choose whole grains. Look for the word "whole" as the first word in the ingredient list.  Choose fish and skinless chicken or turkey more often than red meat. Limit fish, poultry, and meat to 6 oz (170 g) each day.  Limit sweets, desserts, sugars, and sugary drinks.  Choose heart-healthy fats.  Limit cheese to 1 oz (28 g) per day.  Eat more home-cooked food and less restaurant, buffet, and fast food.  Limit fried foods.  Cook foods using methods other than frying.  Limit canned vegetables. If you do use them, rinse them well to decrease the sodium.  When eating at a restaurant, ask that your food be prepared with less salt, or no salt if possible. WHAT FOODS CAN I EAT? Seek help from a dietitian for individual calorie needs. Grains Whole grain or whole wheat bread. Brown rice. Whole grain or whole wheat pasta. Quinoa, bulgur, and whole grain cereals. Low-sodium cereals. Corn or whole wheat flour tortillas. Whole grain cornbread. Whole grain crackers. Low-sodium crackers. Vegetables Fresh or frozen vegetables  (raw, steamed, roasted, or grilled). Low-sodium or reduced-sodium tomato and vegetable juices. Low-sodium or reduced-sodium tomato sauce and paste. Low-sodium or reduced-sodium canned vegetables.  Fruits All fresh, canned (in natural juice), or frozen fruits. Meat and Other Protein Products Ground beef (85% or leaner), grass-fed beef, or beef trimmed of fat. Skinless chicken or turkey. Ground chicken or turkey. Pork trimmed of fat. All fish and seafood. Eggs. Dried beans, peas, or lentils. Unsalted nuts and seeds. Unsalted canned beans. Dairy Low-fat dairy products, such as skim or 1% milk, 2% or reduced-fat cheeses, low-fat ricotta or cottage cheese, or plain low-fat yogurt. Low-sodium or reduced-sodium cheeses. Fats and Oils Tub margarines without trans fats. Light or reduced-fat mayonnaise and salad dressings (reduced sodium). Avocado. Safflower, olive, or canola oils. Natural peanut or almond butter. Other Unsalted popcorn and pretzels. The items listed above may not be a complete list of recommended foods or beverages. Contact your dietitian for more options. WHAT FOODS ARE NOT RECOMMENDED? Grains White bread. White pasta. White rice. Refined cornbread. Bagels and croissants. Crackers that contain trans fat. Vegetables Creamed or fried vegetables. Vegetables in a cheese sauce. Regular canned vegetables. Regular canned tomato sauce and paste. Regular tomato and vegetable juices. Fruits Dried fruits. Canned fruit in light or heavy syrup. Fruit juice. Meat and Other Protein Products Fatty cuts of meat. Ribs, chicken wings, bacon, sausage, bologna, salami, chitterlings, fatback, hot dogs, bratwurst, and packaged luncheon meats. Salted nuts and seeds. Canned beans with salt. Dairy Whole or 2% milk, cream, half-and-half, and cream cheese. Whole-fat or sweetened yogurt. Full-fat   cheeses or blue cheese. Nondairy creamers and whipped toppings. Processed cheese, cheese spreads, or cheese  curds. Condiments Onion and garlic salt, seasoned salt, table salt, and sea salt. Canned and packaged gravies. Worcestershire sauce. Tartar sauce. Barbecue sauce. Teriyaki sauce. Soy sauce, including reduced sodium. Steak sauce. Fish sauce. Oyster sauce. Cocktail sauce. Horseradish. Ketchup and mustard. Meat flavorings and tenderizers. Bouillon cubes. Hot sauce. Tabasco sauce. Marinades. Taco seasonings. Relishes. Fats and Oils Butter, stick margarine, lard, shortening, ghee, and bacon fat. Coconut, palm kernel, or palm oils. Regular salad dressings. Other Pickles and olives. Salted popcorn and pretzels. The items listed above may not be a complete list of foods and beverages to avoid. Contact your dietitian for more information. WHERE CAN I FIND MORE INFORMATION? National Heart, Lung, and [employer] Institute: www.nhlbi.nih.gov/health/health-topics/topics/dash/ Document Released: 04/18/2011 Document Revised: 09/13/2013 Document Reviewed: 03/03/2013 ExitCare Patient Information 2015 [employer]. This information is not intended to replace advice given to you by your health care provider. Make sure you discuss any questions you have with your health care provider. Diabetes Mellitus and Food It is important for you to manage your blood sugar (glucose) level. Your blood glucose level can be greatly affected by what you eat. Eating healthier foods in the appropriate amounts throughout the day at about the same time each day will help you control your blood glucose level. It can also help slow or prevent worsening of your diabetes mellitus. Healthy eating may even help you improve the level of your blood pressure and reach or maintain a healthy weight.  HOW CAN FOOD AFFECT ME? Carbohydrates Carbohydrates affect your blood glucose level more than any other type of food. Your dietitian will help you determine how many carbohydrates to eat at each meal and teach you how to count carbohydrates. Counting  carbohydrates is important to keep your blood glucose at a healthy level, especially if you are using insulin or taking certain medicines for diabetes mellitus. Alcohol Alcohol can cause sudden decreases in blood glucose (hypoglycemia), especially if you use insulin or take certain medicines for diabetes mellitus. Hypoglycemia can be a life-threatening condition. Symptoms of hypoglycemia (sleepiness, dizziness, and disorientation) are similar to symptoms of having too much alcohol.  If your health care provider has given you approval to drink alcohol, do so in moderation and use the following guidelines:  Women should not have more than one drink per day, and men should not have more than two drinks per day. One drink is equal to:  12 oz of beer.  5 oz of wine.  1 oz of hard liquor.  Do not drink on an empty stomach.  Keep yourself hydrated. Have water, diet soda, or unsweetened iced tea.  Regular soda, juice, and other mixers might contain a lot of carbohydrates and should be counted. WHAT FOODS ARE NOT RECOMMENDED? As you make food choices, it is important to remember that all foods are not the same. Some foods have fewer nutrients per serving than other foods, even though they might have the same number of calories or carbohydrates. It is difficult to get your body what it needs when you eat foods with fewer nutrients. Examples of foods that you should avoid that are high in calories and carbohydrates but low in nutrients include:  Trans fats (most processed foods list trans fats on the Nutrition Facts label).  Regular soda.  Juice.  Candy.  Sweets, such as cake, pie, doughnuts, and cookies.  Fried foods. WHAT FOODS CAN I EAT? Have nutrient-rich foods,   which will nourish your body and keep you healthy. The food you should eat also will depend on several factors, including:  The calories you need.  The medicines you take.  Your weight.  Your blood glucose level.  Your  blood pressure level.  Your cholesterol level. You also should eat a variety of foods, including:  Protein, such as meat, poultry, fish, tofu, nuts, and seeds (lean animal proteins are best).  Fruits.  Vegetables.  Dairy products, such as milk, cheese, and yogurt (low fat is best).  Breads, grains, pasta, cereal, rice, and beans.  Fats such as olive oil, trans fat-free margarine, canola oil, avocado, and olives. DOES EVERYONE WITH DIABETES MELLITUS HAVE THE SAME MEAL PLAN? Because every person with diabetes mellitus is different, there is not one meal plan that works for everyone. It is very important that you meet with a dietitian who will help you create a meal plan that is just right for you. Document Released: 01/24/2005 Document Revised: 05/04/2013 Document Reviewed: 03/26/2013 ExitCare Patient Information 2015 [employer]. This information is not intended to replace advice given to you by your health care provider. Make sure you discuss any questions you have with your health care provider.  

## 2014-11-03 NOTE — Progress Notes (Signed)
Patient here for follow up on her diabetes and blood pressure Patient would also like her iron checked -has been taking two tablets daily but  Does not feel energetic like she normally does Patient also needs refills on her medications

## 2014-11-04 LAB — ANEMIA PANEL
%SAT: 9 % — ABNORMAL LOW (ref 20–55)
ABS Retic: 69.3 10*3/uL (ref 19.0–186.0)
FERRITIN: 17 ng/mL (ref 10–291)
FOLATE: 8.2 ng/mL
Iron: 36 ug/dL — ABNORMAL LOW (ref 42–145)
RBC.: 4.62 MIL/uL (ref 3.87–5.11)
RETIC CT PCT: 1.5 % (ref 0.4–2.3)
TIBC: 419 ug/dL (ref 250–470)
UIBC: 383 ug/dL (ref 125–400)
Vitamin B-12: 697 pg/mL (ref 211–911)

## 2014-11-04 LAB — CBC WITH DIFFERENTIAL/PLATELET
BASOS ABS: 0.1 10*3/uL (ref 0.0–0.1)
Basophils Relative: 1 % (ref 0–1)
EOS PCT: 1 % (ref 0–5)
Eosinophils Absolute: 0.1 10*3/uL (ref 0.0–0.7)
HCT: 34.4 % — ABNORMAL LOW (ref 36.0–46.0)
Hemoglobin: 10.5 g/dL — ABNORMAL LOW (ref 12.0–15.0)
LYMPHS PCT: 30 % (ref 12–46)
Lymphs Abs: 2.1 10*3/uL (ref 0.7–4.0)
MCH: 22.7 pg — AB (ref 26.0–34.0)
MCHC: 30.5 g/dL (ref 30.0–36.0)
MCV: 74.5 fL — ABNORMAL LOW (ref 78.0–100.0)
MPV: 10.3 fL (ref 8.6–12.4)
Monocytes Absolute: 0.5 10*3/uL (ref 0.1–1.0)
Monocytes Relative: 7 % (ref 3–12)
NEUTROS PCT: 61 % (ref 43–77)
Neutro Abs: 4.2 10*3/uL (ref 1.7–7.7)
PLATELETS: 447 10*3/uL — AB (ref 150–400)
RBC: 4.62 MIL/uL (ref 3.87–5.11)
RDW: 17.9 % — ABNORMAL HIGH (ref 11.5–15.5)
WBC: 6.9 10*3/uL (ref 4.0–10.5)

## 2014-11-04 LAB — COMPLETE METABOLIC PANEL WITH GFR
ALBUMIN: 4.1 g/dL (ref 3.5–5.2)
AST: 13 U/L (ref 0–37)
Alkaline Phosphatase: 106 U/L (ref 39–117)
BUN: 13 mg/dL (ref 6–23)
CHLORIDE: 102 meq/L (ref 96–112)
CO2: 23 meq/L (ref 19–32)
Calcium: 9.6 mg/dL (ref 8.4–10.5)
Creat: 0.76 mg/dL (ref 0.50–1.10)
GFR, Est Non African American: >89 mL/min
Glucose, Bld: 192 mg/dL — ABNORMAL HIGH (ref 70–99)
POTASSIUM: 4.3 meq/L (ref 3.5–5.3)
Sodium: 139 mEq/L (ref 135–145)
Total Bilirubin: 0.4 mg/dL (ref 0.2–1.2)
Total Protein: 7.8 g/dL (ref 6.0–8.3)

## 2014-11-07 ENCOUNTER — Other Ambulatory Visit: Payer: Self-pay | Admitting: Family Medicine

## 2014-11-07 DIAGNOSIS — E119 Type 2 diabetes mellitus without complications: Secondary | ICD-10-CM

## 2014-11-07 DIAGNOSIS — D509 Iron deficiency anemia, unspecified: Secondary | ICD-10-CM

## 2014-11-07 MED ORDER — SITAGLIPTIN PHOSPHATE 100 MG PO TABS: 100.0000 mg | ORAL_TABLET | Freq: Every day | ORAL | Status: DC

## 2014-11-07 MED ORDER — FERROUS SULFATE 325 (65 FE) MG PO TABS: 325.0000 mg | ORAL_TABLET | Freq: Three times a day (TID) | ORAL | Status: DC

## 2014-11-07 NOTE — Assessment & Plan Note (Signed)
Microcytic anemia due to iron deficiency continues but has improved continue iron 2-3 times daily with vit C tablet or  3-4 oz of  Sugar free/low sugar OJ to help absorption

## 2014-11-07 NOTE — Assessment & Plan Note (Signed)
  Normal CMP except blood sugar is a bit elevated even for non-fasting, known diabetic on metfomrin 1000 mg BID, A1c on the rise: Plan: eat a low carb diet, regular exercise, Add januvia 100 mg daily in AM to get sugars and A1c to trend down

## 2014-11-08 ENCOUNTER — Telehealth: Payer: Self-pay

## 2014-11-08 NOTE — Telephone Encounter (Signed)
Patient not available Left message on voice mail to return our call 

## 2014-11-08 NOTE — Telephone Encounter (Signed)
-----   Message from Boykin Nearing, MD sent at 11/07/2014  1:57 PM EDT ----- Microcytic anemia due to iron deficiency continues but has improved continue iron 2-3 times daily with vit C tablet or  3-4 oz of  Sugar free/low sugar OJ to help absorption   Normal CMP except blood sugar is a bit elevated even for non-fasting, known diabetic on metfomrin 1000 mg BID, A1c on the rise: Plan: eat a low carb diet, regular exercise, Add januvia 100 mg daily in AM to get sugars and A1c to trend down

## 2014-11-16 NOTE — Telephone Encounter (Signed)
Patient is returning phone call, please f/u with pt.

## 2016-03-12 ENCOUNTER — Ambulatory Visit (INDEPENDENT_AMBULATORY_CARE_PROVIDER_SITE_OTHER): Payer: BC Managed Care – PPO | Admitting: Medical

## 2016-03-12 ENCOUNTER — Other Ambulatory Visit: Payer: Self-pay | Admitting: Medical

## 2016-03-12 ENCOUNTER — Encounter: Payer: Self-pay | Admitting: Medical

## 2016-03-12 VITALS — BP 200/110 | HR 93 | Temp 98.1°F | Wt 209.6 lb

## 2016-03-12 DIAGNOSIS — T50905A Adverse effect of unspecified drugs, medicaments and biological substances, initial encounter: Secondary | ICD-10-CM

## 2016-03-12 DIAGNOSIS — E639 Nutritional deficiency, unspecified: Secondary | ICD-10-CM | POA: Diagnosis not present

## 2016-03-12 DIAGNOSIS — R42 Dizziness and giddiness: Secondary | ICD-10-CM

## 2016-03-12 DIAGNOSIS — I10 Essential (primary) hypertension: Secondary | ICD-10-CM

## 2016-03-12 DIAGNOSIS — Z91199 Patient's noncompliance with other medical treatment and regimen due to unspecified reason: Secondary | ICD-10-CM | POA: Insufficient documentation

## 2016-03-12 DIAGNOSIS — D509 Iron deficiency anemia, unspecified: Secondary | ICD-10-CM | POA: Diagnosis not present

## 2016-03-12 DIAGNOSIS — Z9119 Patient's noncompliance with other medical treatment and regimen: Secondary | ICD-10-CM

## 2016-03-12 DIAGNOSIS — E118 Type 2 diabetes mellitus with unspecified complications: Secondary | ICD-10-CM

## 2016-03-12 LAB — CBC WITH DIFFERENTIAL/PLATELET
Basophils Absolute: 0 cells/uL (ref 0–200)
Basophils Relative: 0 %
EOS ABS: 77 {cells}/uL (ref 15–500)
Eosinophils Relative: 1 %
HCT: 35.3 % (ref 35.0–45.0)
Hemoglobin: 10.6 g/dL — ABNORMAL LOW (ref 11.7–15.5)
Lymphocytes Relative: 27 %
Lymphs Abs: 2079 cells/uL (ref 850–3900)
MCH: 22.6 pg — ABNORMAL LOW (ref 27.0–33.0)
MCHC: 30 g/dL — AB (ref 32.0–36.0)
MCV: 75.1 fL — AB (ref 80.0–100.0)
MONO ABS: 539 {cells}/uL (ref 200–950)
MONOS PCT: 7 %
MPV: 10.2 fL (ref 7.5–12.5)
NEUTROS PCT: 65 %
Neutro Abs: 5005 cells/uL (ref 1500–7800)
PLATELETS: 457 10*3/uL — AB (ref 140–400)
RBC: 4.7 MIL/uL (ref 3.80–5.10)
RDW: 17.4 % — AB (ref 11.0–15.0)
WBC: 7.7 10*3/uL (ref 4.0–10.5)

## 2016-03-12 LAB — POCT URINALYSIS DIPSTICK
Bilirubin, UA: NEGATIVE
Ketones, UA: NEGATIVE
Leukocytes, UA: NEGATIVE
Nitrite, UA: NEGATIVE
PH UA: 5.5
Urobilinogen, UA: NEGATIVE

## 2016-03-12 LAB — HEPATIC FUNCTION PANEL
ALBUMIN: 3.9 g/dL (ref 3.6–5.1)
ALT: 8 U/L (ref 6–29)
AST: 16 U/L (ref 10–35)
Alkaline Phosphatase: 106 U/L (ref 33–115)
BILIRUBIN TOTAL: 0.2 mg/dL (ref 0.2–1.2)
Bilirubin, Direct: 0.1 mg/dL (ref <=0.2)
Indirect Bilirubin: 0.1 mg/dL — ABNORMAL LOW (ref 0.2–1.2)
Total Protein: 7.5 g/dL (ref 6.1–8.1)

## 2016-03-12 LAB — TSH: TSH: 0.89 mIU/L

## 2016-03-12 LAB — BASIC METABOLIC PANEL
BUN: 13 mg/dL (ref 7–25)
CO2: 24 mmol/L (ref 20–31)
CREATININE: 0.78 mg/dL (ref 0.50–1.10)
Calcium: 9.2 mg/dL (ref 8.6–10.2)
Chloride: 100 mmol/L (ref 98–110)
Glucose, Bld: 250 mg/dL — ABNORMAL HIGH (ref 65–99)
Potassium: 4 mmol/L (ref 3.5–5.3)
Sodium: 134 mmol/L — ABNORMAL LOW (ref 135–146)

## 2016-03-12 LAB — HEMOGLOBIN A1C
Hgb A1c MFr Bld: 10.2 % — ABNORMAL HIGH (ref <5.7)
MEAN PLASMA GLUCOSE: 246 mg/dL

## 2016-03-12 LAB — BRAIN NATRIURETIC PEPTIDE: BRAIN NATRIURETIC PEPTIDE: 63.9 pg/mL (ref <100)

## 2016-03-12 LAB — GLUCOSE, POCT (MANUAL RESULT ENTRY): POC GLUCOSE: 240 mg/dL — AB (ref 70–99)

## 2016-03-12 MED ORDER — FERROUS SULFATE 325 (65 FE) MG PO TABS
325.0000 mg | ORAL_TABLET | Freq: Two times a day (BID) | ORAL | 1 refills | Status: DC
Start: 2016-03-12 — End: 2018-05-25

## 2016-03-12 MED ORDER — OLMESARTAN-AMLODIPINE-HCTZ 40-5-25 MG PO TABS: 1.0000 | ORAL_TABLET | Freq: Every day | ORAL | 0 refills | Status: DC

## 2016-03-12 MED ORDER — SAXAGLIPTIN-METFORMIN ER 5-500 MG PO TB24: 1.0000 | ORAL_TABLET | Freq: Two times a day (BID) | ORAL | 1 refills | Status: DC

## 2016-03-12 MED ORDER — BLOOD GLUCOSE MONITOR KIT: PACK | 0 refills | Status: DC

## 2016-03-12 NOTE — Progress Notes (Signed)
Subjective: Chief Complaint  Patient presents with  . lightheaded    lightheaded, tired, frequency in urination due to DM   Here for not feeling well.  She has a hx/o CHF 2013, hypertension, diabetes, heart murmur, anemia.    Accompanied by husband.  Her last visit here was 2015 for diabetes f/u. In the past year went to [facility].  But over past 6 months or more quit taking all her medications.  Had diarrhea with metformin, couldn't afford the BP medication at one point.  She is using no diet discretion and husband reports she eats cookies and chips regularly.  She is not checking BPs or glucose.     She came in today as she left work feeling lightheaded, unsteady walking, stomach hurting, nausea.    She denies edema, SOB, CP, orthopnea, no PND.  She does endorses polyuria, polydipsia.  No urinary burning , no headache, no arm numbness, tingling or weakness.    Of note, she has 4 children aged 32yo, 73yo, 45yo, 9yo.      Past Medical History:  Diagnosis Date  . Anemia   . CHF (congestive heart failure)   . Diabetes mellitus   . Heart murmur   . Hypertension   . Pneumonia    Current Outpatient Prescriptions on File Prior to Visit  Medication Sig Dispense Refill  . carvedilol (COREG) 25 MG tablet Take 1 tablet (25 mg total) by mouth 2 (two) times daily with a meal. (Patient not taking: Reported on 03/12/2016) 60 tablet 5   No current facility-administered medications on file prior to visit.    ROS as in subjective   Objective: BP (!) 200/110   Pulse 93   Temp 98.1 F (36.7 C) (Oral)   Wt 209 lb 9.6 oz (95.1 kg)   SpO2 98%   BMI 35.98 kg/m   Wt Readings from Last 3 Encounters:  03/12/16 209 lb 9.6 oz (95.1 kg)  11/03/14 212 lb (96.2 kg)  07/26/14 213 lb (96.6 kg)   BP Readings from Last 3 Encounters:  03/12/16 (!) 200/110  11/03/14 140/70  08/22/14 129/79    General appearance: alert, no distress, WD/WN, AA female, urine odor HEENT:  normocephalic, sclerae anicteric, PERRLA, EOMi, nares patent, no discharge or erythema, pharynx normal Oral cavity: MMM, no lesions Neck: supple, no lymphadenopathy, no thyromegaly, no masses, no bruits Heart: 2/6 holosystolic murmur heard best in upper sternal borders, otherwise RR, normal S1, S2, no murmurs Lungs: CTA bilaterally, no wheezes, rhonchi, or rales Abdomen: +bs, soft, non tender, non distended, no masses, no hepatomegaly, no splenomegaly Extremities: no edema, no cyanosis, no clubbing Pulses: 1+ symmetric, upper and lower extremities, normal cap refill Neurological: alert, oriented x 3, CN2-12 intact, strength normal upper extremities and lower extremities, sensation normal throughout, DTRs 2+ throughout, no cerebellar signs, gait normal Psychiatric: normal affect, behavior normal, pleasant    Adult ECG Report  Indication: lightheaded, HTN  Rate: 85 bpm  Rhythm: normal sinus rhythm and sinus arrhythmia  QRS Axis: -5 degrees  PR Interval: 155ms  QRS Duration: 93ms  QTc: 572ms  Conduction Disturbances: possible atrial enlargement, LVH, prolonged QT, nonspecific T wave abnormality  Other Abnormalities: none  Patient's cardiac risk factors are: diabetes mellitus, hypertension, obesity (BMI >= 30 kg/m2) and sedentary lifestyle.  EKG comparison: 06/2013   Narrative Interpretation: abnormal EKG    Assessment: Encounter Diagnoses  Name Primary?  . Diabetes mellitus with complication ([place]) Yes  . Uncontrolled hypertension   .  Lightheaded   . Iron deficiency anemia, unspecified iron deficiency anemia type   . Poor diet   . Noncompliance   . Adverse effect of drug, initial encounter      Plan: STAT labs today, reviewed EKG ,urinalysis, fingerstick glucose.   Pending labs will begin back on medication for diabetes.  Restart Tribenzor.    Spend great deal of time discussing diet. Spent a lot of time discussing need for compliance, keeping [place] informed if insurance changes,  financial difficulties or trouble getting medications .  advised minimum of visits q53mo, advised of possible complications of uncontrolled diabetes.    Tyleigh was seen today for lightheaded.  Diagnoses and all orders for this visit:  Diabetes mellitus with complication (HCC) -     Urinalysis Dipstick -     CBC with Differential/Platelet -     Basic metabolic panel -     TSH -     Brain natriuretic peptide -     Hepatic function panel -     EKG 12-Lead -     Hemoglobin A1c -     POCT glucose (manual entry)  Uncontrolled hypertension -     CBC with Differential/Platelet -     Basic metabolic panel -     TSH -     Brain natriuretic peptide -     Hepatic function panel -     EKG 12-Lead -     Hemoglobin A1c  Lightheaded -     Urinalysis Dipstick -     CBC with Differential/Platelet -     Basic metabolic panel -     TSH -     Brain natriuretic peptide -     Hepatic function panel -     EKG 12-Lead -     Hemoglobin A1c  Iron deficiency anemia, unspecified iron deficiency anemia type -     CBC with Differential/Platelet  Poor diet -     CBC with Differential/Platelet  Noncompliance  Adverse effect of drug, initial encounter

## 2016-03-13 ENCOUNTER — Emergency Department (HOSPITAL_COMMUNITY): Payer: BC Managed Care – PPO

## 2016-03-13 ENCOUNTER — Encounter (HOSPITAL_COMMUNITY): Payer: Self-pay | Admitting: *Deleted

## 2016-03-13 ENCOUNTER — Emergency Department (HOSPITAL_COMMUNITY)
Admission: EM | Admit: 2016-03-13 | Discharge: 2016-03-13 | Disposition: A | Discharge status: Home / Self Care | Payer: BC Managed Care – PPO | Attending: Emergency Medicine | Admitting: Emergency Medicine

## 2016-03-13 DIAGNOSIS — Z7984 Long term (current) use of oral hypoglycemic drugs: Secondary | ICD-10-CM | POA: Diagnosis not present

## 2016-03-13 DIAGNOSIS — E119 Type 2 diabetes mellitus without complications: Secondary | ICD-10-CM | POA: Insufficient documentation

## 2016-03-13 DIAGNOSIS — I509 Heart failure, unspecified: Secondary | ICD-10-CM | POA: Insufficient documentation

## 2016-03-13 DIAGNOSIS — J111 Influenza due to unidentified influenza virus with other respiratory manifestations: Secondary | ICD-10-CM | POA: Diagnosis not present

## 2016-03-13 DIAGNOSIS — R69 Illness, unspecified: Secondary | ICD-10-CM

## 2016-03-13 DIAGNOSIS — I159 Secondary hypertension, unspecified: Secondary | ICD-10-CM | POA: Insufficient documentation

## 2016-03-13 DIAGNOSIS — R42 Dizziness and giddiness: Secondary | ICD-10-CM | POA: Diagnosis present

## 2016-03-13 LAB — CBC
HEMATOCRIT: 36.4 % (ref 36.0–46.0)
HEMOGLOBIN: 11.4 g/dL — AB (ref 12.0–15.0)
MCH: 23 pg — ABNORMAL LOW (ref 26.0–34.0)
MCHC: 31.3 g/dL (ref 30.0–36.0)
MCV: 73.4 fL — ABNORMAL LOW (ref 78.0–100.0)
Platelets: 394 10*3/uL (ref 150–400)
RBC: 4.96 MIL/uL (ref 3.87–5.11)
RDW: 17.6 % — ABNORMAL HIGH (ref 11.5–15.5)
WBC: 8.4 10*3/uL (ref 4.0–10.5)

## 2016-03-13 LAB — COMPREHENSIVE METABOLIC PANEL
ALK PHOS: 127 U/L — AB (ref 38–126)
ALT: 13 U/L — AB (ref 14–54)
AST: 23 U/L (ref 15–41)
Albumin: 4.1 g/dL (ref 3.5–5.0)
Anion gap: 11 (ref 5–15)
BILIRUBIN TOTAL: 0.4 mg/dL (ref 0.3–1.2)
BUN: 9 mg/dL (ref 6–20)
CALCIUM: 9.4 mg/dL (ref 8.9–10.3)
CO2: 26 mmol/L (ref 22–32)
CREATININE: 0.6 mg/dL (ref 0.44–1.00)
Chloride: 96 mmol/L — ABNORMAL LOW (ref 101–111)
GFR calc Af Amer: >60 mL/min (ref >60)
Glucose, Bld: 288 mg/dL — ABNORMAL HIGH (ref 65–99)
POTASSIUM: 3.7 mmol/L (ref 3.5–5.1)
Sodium: 133 mmol/L — ABNORMAL LOW (ref 135–145)
TOTAL PROTEIN: 8.4 g/dL — AB (ref 6.5–8.1)

## 2016-03-13 LAB — DIFFERENTIAL
BASOS ABS: 0 10*3/uL (ref 0.0–0.1)
Basophils Relative: 0 %
EOS ABS: 0 10*3/uL (ref 0.0–0.7)
Eosinophils Relative: 0 %
LYMPHS ABS: 2.6 10*3/uL (ref 0.7–4.0)
LYMPHS PCT: 31 %
MONOS PCT: 6 %
Monocytes Absolute: 0.5 10*3/uL (ref 0.1–1.0)
Neutro Abs: 5.3 10*3/uL (ref 1.7–7.7)
Neutrophils Relative %: 63 %

## 2016-03-13 LAB — IRON: Iron: 18 ug/dL — ABNORMAL LOW (ref 40–190)

## 2016-03-13 LAB — PROTIME-INR
INR: 0.94
Prothrombin Time: 12.6 seconds (ref 11.4–15.2)

## 2016-03-13 LAB — APTT: APTT: 27 s (ref 24–36)

## 2016-03-13 LAB — I-STAT TROPONIN, ED: TROPONIN I, POC: 0 ng/mL (ref 0.00–0.08)

## 2016-03-13 LAB — CBG MONITORING, ED: Glucose-Capillary: 258 mg/dL — ABNORMAL HIGH (ref 65–99)

## 2016-03-13 MED ORDER — ONDANSETRON HCL 4 MG PO TABS: 4.0000 mg | ORAL_TABLET | Freq: Three times a day (TID) | ORAL | 0 refills | Status: DC | PRN

## 2016-03-13 MED ORDER — ONDANSETRON HCL 4 MG/2ML IJ SOLN
4.0000 mg | Freq: Once | INTRAMUSCULAR | Status: AC
  Administered 2016-03-13: 4 mg via INTRAVENOUS
  Filled 2016-03-13: qty 2

## 2016-03-13 MED ORDER — OLMESARTAN-AMLODIPINE-HCTZ 40-5-25 MG PO TABS: 1.0000 | ORAL_TABLET | Freq: Every day | ORAL | 5 refills | Status: DC

## 2016-03-13 MED ORDER — SODIUM CHLORIDE 0.9 % IV BOLUS (SEPSIS)
1000.0000 mL | Freq: Once | INTRAVENOUS | Status: AC
  Administered 2016-03-13: 1000 mL via INTRAVENOUS

## 2016-03-13 NOTE — ED Notes (Signed)
assisted up to br to void

## 2016-03-13 NOTE — ED Notes (Signed)
Pt back from ct feels nauseaous husband in room

## 2016-03-13 NOTE — ED Provider Notes (Signed)
[facility] DEPT Provider Note   CSN: [id number] Arrival date & time: 03/13/16  1248     History   Chief Complaint Chief Complaint  Patient presents with  . Hypertension  . Headache    HPI [name] is a 50 y.o. female.  HPI 50 year old female who presents with hypertension and headache. History of HTN and DM. States she has been feeling dizzy and unsteady over past few days due to elevated BP at home. Use to take carvedilol and tribenozar but self discontinued these medications in 10/2015. She saw her primary care doctor yesterday, who restarted her on Tribenozar. States that she has been taking them for the past 2 days. States that over the past 24 hours, she has developed nausea, vomiting, diarrhea, headache, mild nonproductive cough, myalgias and arthralgias, generalized weakness and malaise. HA started today, bifrontal, gradually worsening. Reports subjective fevers and chills. 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 Denies any diplopia, slurred speech or word finding difficulties, focal numbness or weakness, chest pain or difficulty breathing, lower extremity edema.  Past Medical History:  Diagnosis Date  . Anemia   . CHF (congestive heart failure) ([place])   . Diabetes mellitus   . Heart murmur   . Hypertension   . Pneumonia     Patient Active Problem List   Diagnosis Date Noted  . Noncompliance 03/12/2016  . Screening examination for venereal disease 02/08/2013  . Excessive or frequent menstruation 02/08/2013  . Leiomyoma of uterus, unspecified 02/08/2013  . Uncontrolled hypertension 05/06/2012  . CHF, acute ([place]) 05/06/2012  . IDA (iron deficiency anemia) 11/18/2008  . DM2 (diabetes mellitus, type 2) ([place]) 11/17/2008  . HYPERTENSION, BENIGN ESSENTIAL 11/17/2008    Past Surgical History:  Procedure Laterality Date  . CESAREAN SECTION    . TUBAL  LIGATION      OB History    No data available       Home Medications    Prior to Admission medications   Medication Sig Start Date End Date Taking? Authorizing Provider  blood glucose meter kit and supplies KIT Dispense based on patient and insurance preference. Use up to four times daily as directed. (FOR ICD-9 250.00, 250.01). 03/12/16  Yes David S Tysinger, PA-C  Olmesartan-Amlodipine-HCTZ 40-5-25 MG TABS Take 1 tablet by mouth daily. 03/12/16  Yes Camelia Eng Tysinger, PA-C  carvedilol (COREG) 25 MG tablet Take 1 tablet (25 mg total) by mouth 2 (two) times daily with a meal. Patient not taking: Reported on 03/13/2016 11/03/14   Lorayne Marek, MD  ferrous sulfate 325 (65 FE) MG tablet Take 1 tablet (325 mg total) by mouth 2 (two) times daily with a meal. Patient not taking: Reported on 03/13/2016 03/12/16   Camelia Eng Tysinger, PA-C  Olmesartan-Amlodipine-HCTZ 40-5-25 MG TABS Take 1 tablet by mouth daily. 03/13/16   Forde Dandy, MD  ondansetron (ZOFRAN) 4 MG tablet Take 1 tablet (4 mg total) by mouth every 8 (eight) hours as needed for nausea or vomiting. 03/13/16   Forde Dandy, MD  Saxagliptin-Metformin 5-500 MG TB24 Take 1 tablet by mouth 2 (two) times daily. Patient not taking: Reported on 03/13/2016 03/12/16   Carlena Hurl, PA-C    Family History Family History  Problem Relation Age of Onset  . Hypertension Mother   . Hypertension Brother     Social History Social History  Substance Use  Topics  . Smoking status: Never Smoker  . Smokeless tobacco: Never Used  . Alcohol use Yes     Comment: occasional     Allergies   Review of patient's allergies indicates no known allergies.   Review of Systems Review of Systems 10/14 systems reviewed and are negative other than those stated in the HPI   Physical Exam Updated Vital Signs BP 180/98   Pulse 87   Temp 98.6 F (37 C) (Oral)   Resp 16   Ht '5\' 4"'  (1.626 m)   Wt 209 lb (94.8 kg)   LMP 02/25/2016   SpO2 98%   BMI  35.87 kg/m   Physical Exam Physical Exam  Nursing note and vitals reviewed. Constitutional: Low energy, non-toxic, and in no acute distress Head: Normocephalic and atraumatic.  Mouth/Throat: Oropharynx is clear and moist.  Neck: Normal range of motion. Neck supple. no meningismus. Cardiovascular: Normal rate and regular rhythm.   Pulmonary/Chest: Effort normal and breath sounds normal. no Edema Abdominal: Soft. There is no tenderness. There is no rebound and no guarding.  Musculoskeletal: Normal range of motion.  Neurological:  Alert, oriented to person, place, time, and situation. Memory grossly in tact. Fluent speech. No dysarthria or aphasia.  Cranial nerves: VF are full. Pupils are symmetric, and reactive to light. EOMI without nystagmus. No gaze deviation. Facial muscles symmetric with activation. Sensation to light touch over face in tact bilaterally. Hearing grossly in tact. Palate elevates symmetrically. Head turn and shoulder shrug are intact. Tongue midline.  Reflexes defered.  Muscle bulk and tone normal. No pronator drift. Moves all extremities symmetrically. Sensation to light touch is in tact throughout in bilateral upper and lower extremities. Coordination reveals no dysmetria with finger to nose. Gait is narrow-based and steady. Non-ataxic. Skin: Skin is warm and dry.  Psychiatric: Cooperative   ED Treatments / Results  Labs (all labs ordered are listed, but only abnormal results are displayed) Labs Reviewed  CBC - Abnormal; Notable for the following:       Result Value   Hemoglobin 11.4 (*)    MCV 73.4 (*)    MCH 23.0 (*)    RDW 17.6 (*)    All other components within normal limits  COMPREHENSIVE METABOLIC PANEL - Abnormal; Notable for the following:    Sodium 133 (*)    Chloride 96 (*)    Glucose, Bld 288 (*)    Total Protein 8.4 (*)    ALT 13 (*)    Alkaline Phosphatase 127 (*)    All other components within normal limits  CBG MONITORING, ED - Abnormal;  Notable for the following:    Glucose-Capillary 258 (*)    All other components within normal limits  PROTIME-INR  APTT  DIFFERENTIAL  I-STAT TROPOININ, ED    EKG  EKG Interpretation  Date/Time:  Wednesday March 13 2016 13:12:15 EDT Ventricular Rate:  115 PR Interval:  156 QRS Duration: 92 QT Interval:  352 QTC Calculation: 486 R Axis:   56 Text Interpretation:  Sinus tachycardia Biatrial enlargement Nonspecific T wave abnormality Abnormal ECG Similar to EKG performed one day ago aside from tachycardia  Confirmed by LIU MD, DANA [phone]) on 03/13/2016 3:04:18 PM       Radiology Ct Head Wo Contrast  Result Date: 03/13/2016 CLINICAL DATA:  Unsteady gait for 1 EXAM: CT HEAD WITHOUT CONTRAST TECHNIQUE: Contiguous axial images were obtained from the base of the skull through the vertex without intravenous contrast. COMPARISON:  04/26/2014 FINDINGS: Brain:  No evidence of acute infarction, hemorrhage, hydrocephalus, extra-axial collection or mass lesion/mass effect. Mild periventricular white matter low attenuation likely secondary to chronic microvascular disease. Vascular: No hyperdense vessel or unexpected calcification. Skull: No osseous abnormality. Sinuses/Orbits: Visualized paranasal sinuses are clear. Visualized mastoid sinuses are clear. Visualized orbits demonstrate no focal abnormality. Other: None IMPRESSION: No acute intracranial pathology. Electronically Signed   By: Kathreen Devoid   On: 03/13/2016 14:01    Procedures Procedures (including critical care time)  Medications Ordered in ED Medications  sodium chloride 0.9 % bolus 1,000 mL (0 mLs Intravenous Stopped 03/13/16 1704)  ondansetron (ZOFRAN) injection 4 mg (4 mg Intravenous Given 03/13/16 1458)     Initial Impression / Assessment and Plan / ED Course  I have reviewed the triage vital signs and the nursing notes.  Pertinent labs & imaging results that were available during my care of the patient were reviewed by me  and considered in my medical decision making (see chart for details).  Clinical Course   Presenting with likely flu like illness, especially given multiple sick contacts at work. Her symptoms of nausea, vomiting, diarrhea, subjective fever or chills, headache, body aches does not seem related to hypertension. She does have hypertension here, but she is primary 150-170/80-90s when I am in the room with her, with transient elevated blood pressures higher. I do not think that this is symptomatic from that. She has a normal neuro exam, and stroke like symptoms order set was ordered in triage. She has normal CT head. Basic blood work with mild hypochloremia and hyponatremia, but otherwise blood work reassuring. No meningismus or concerns for meningitis. Headache not of sudden onset maximal intensity to suggest SAH. No concerns for other concerning intracranial etiologies of headache. Given supportive management her in ED. I feel she is stable for continued supportive management at home. Strict return and follow-up instructions reviewed. She expressed understanding of all discharge instructions and felt comfortable with the plan of care.   Final Clinical Impressions(s) / ED Diagnoses   Final diagnoses:  Influenza-like illness  Secondary hypertension    New Prescriptions New Prescriptions   OLMESARTAN-AMLODIPINE-HCTZ 40-5-25 MG TABS    Take 1 tablet by mouth daily.   ONDANSETRON (ZOFRAN) 4 MG TABLET    Take 1 tablet (4 mg total) by mouth every 8 (eight) hours as needed for nausea or vomiting.     Forde Dandy, MD 03/13/16 [phone]

## 2016-03-13 NOTE — ED Notes (Signed)
Patient transported to CT 

## 2016-03-13 NOTE — Discharge Instructions (Signed)
Follow-up with your primary care doctor within one week.  Continue to take your blood pressure medications at home as prescribed.   You likely have a flu like illness that is making you sick.   Please return without fail for worsening symptoms, including intractable vomiting, worsening pain, confusion, or any other symptoms concerning to you.

## 2016-03-13 NOTE — ED Notes (Signed)
Pt vomited about 700 cc

## 2016-03-13 NOTE — ED Notes (Signed)
Patient using nurse phone to call family member.

## 2016-03-13 NOTE — ED Triage Notes (Signed)
Pt reports onset yesterday of feeling unsteady and had HA. Went to pcp after work and bp was 200/110. Pt has hx of HTN but had stopped taking her meds over the past summer. Started taking bp meds yesterday evening. Pt reprots nausea, still reports unsteady gait. Grips are equal, no facial droop noted at triage. BP 184/102.

## 2016-03-14 ENCOUNTER — Other Ambulatory Visit: Payer: Self-pay | Admitting: Internal Medicine

## 2016-03-14 MED ORDER — BLOOD GLUCOSE TEST VI STRP: ORAL_STRIP | 1 refills | Status: DC

## 2016-03-14 MED ORDER — LANCETS MISC: 1 refills | Status: DC

## 2016-03-15 ENCOUNTER — Telehealth: Payer: Self-pay

## 2016-03-15 ENCOUNTER — Other Ambulatory Visit: Payer: Self-pay | Admitting: Medical

## 2016-03-15 MED ORDER — SITAGLIPTIN-METFORMIN HCL 50-1000 MG PO TABS
1.0000 | ORAL_TABLET | Freq: Two times a day (BID) | ORAL | 3 refills | Status: DC
Start: 2016-03-15 — End: 2016-08-20

## 2016-03-15 NOTE — Telephone Encounter (Signed)
Pt states medication Saxagliptin- Metformin is over $2600, she request a cheaper alternative.  Thanks, Wells Guiles

## 2016-03-15 NOTE — Telephone Encounter (Signed)
I sent Janumet instead of [place].  Lets see if this is reasonable.  Thanks for letting me know

## 2016-03-15 NOTE — Telephone Encounter (Signed)
Pt.notified

## 2016-03-17 ENCOUNTER — Telehealth: Payer: Self-pay | Admitting: Medical

## 2016-03-17 NOTE — Telephone Encounter (Signed)
Pt was switched to Janumet

## 2016-03-20 NOTE — Telephone Encounter (Signed)
Pt was switched to Janumet

## 2016-04-22 ENCOUNTER — Ambulatory Visit (INDEPENDENT_AMBULATORY_CARE_PROVIDER_SITE_OTHER): Payer: BC Managed Care – PPO | Admitting: Family Medicine

## 2016-04-22 ENCOUNTER — Encounter: Payer: Self-pay | Admitting: Family Medicine

## 2016-04-22 VITALS — BP 134/80 | HR 96 | Resp 12 | Ht 64.0 in | Wt 216.5 lb

## 2016-04-22 DIAGNOSIS — D509 Iron deficiency anemia, unspecified: Secondary | ICD-10-CM

## 2016-04-22 DIAGNOSIS — Z6837 Body mass index (BMI) 37.0-37.9, adult: Secondary | ICD-10-CM | POA: Diagnosis not present

## 2016-04-22 DIAGNOSIS — I10 Essential (primary) hypertension: Secondary | ICD-10-CM

## 2016-04-22 DIAGNOSIS — E119 Type 2 diabetes mellitus without complications: Secondary | ICD-10-CM

## 2016-04-22 DIAGNOSIS — Z23 Encounter for immunization: Secondary | ICD-10-CM

## 2016-04-22 DIAGNOSIS — M25571 Pain in right ankle and joints of right foot: Secondary | ICD-10-CM

## 2016-04-22 DIAGNOSIS — E669 Obesity, unspecified: Secondary | ICD-10-CM | POA: Insufficient documentation

## 2016-04-22 NOTE — Progress Notes (Signed)
HPI:   [name] is a 50 y.o. female, who is here today to establish care with me.  Former PCP: Mr [name], [place] Last preventive routine visit: over a year ago.   Concerns today: right 5th toe pain, for "a while" now. She thinks it might be related to a fungal infection.  No numbness or tingling. No injury recently. Pain otherwise stable, "sore itching", mild, she has not identified exacerbating or relieving factors. She has not noted skin rash. She has not tried OTC medications.  Diabetes Mellitus II:   After gestational diabetes, about 20 some years ago.. Last eye exam 2 years ago.   Currently on Janumet, just added in 02/2016 after last HgA1C. Problem has been stable. Checking BS's : No Hypoglycemia: Denies symptoms that suggest low BS's.   She is tolerating medications well. She denies abdominal pain, nausea, vomiting, polydipsia, polyuria, or polyphagia. No numbness, tingling, or burning.     Lab Results  Component Value Date   CREATININE 0.60 03/13/2016   BUN 9 03/13/2016   NA 133 (L) 03/13/2016   K 3.7 03/13/2016   CL 96 (L) 03/13/2016   CO2 26 03/13/2016    Lab Results  Component Value Date   HGBA1C 10.2 (H) 03/12/2016   Lab Results  Component Value Date   MICROALBUR 2.19 (H) 01/25/2014   She does not exercise and does not follow a healthy diet consistently.   Hx of iron deficiency anemia, attributed to heavy menses. LMP 03/27/16.  Lab Results  Component Value Date   WBC 8.4 03/13/2016   HGB 11.4 (L) 03/13/2016   HCT 36.4 03/13/2016   MCV 73.4 (L) 03/13/2016   PLT 394 03/13/2016   She is currently on ferrous sulfate 325 mg daily. She denies any changes in bowel habits or blood in stool.  FHX negative for colon cancer.   Lab Results  Component Value Date   TSH 0.89 03/12/2016   HTN: Currently she is on olmesartan- amlodipine- HCTZ 40-5-25 mg daily. Denies severe/frequent headache, visual changes, chest pain,  dyspnea, palpitation, claudication, or focal weakness. + CHF, she denies orthopnea or PND.  Echo 04/2012 LVEF 50%   Review of Systems  Constitutional: Negative for activity change, appetite change, fever and unexpected weight change.  HENT: Negative for mouth sores, nosebleeds and trouble swallowing.   Eyes: Negative for pain, redness and visual disturbance.  Respiratory: Negative for cough, shortness of breath and wheezing.   Cardiovascular: Negative for chest pain and palpitations.  Gastrointestinal: Negative for abdominal pain, blood in stool, nausea and vomiting.       Negative for changes in bowel habits.  Endocrine: Negative for polydipsia, polyphagia and polyuria.  Genitourinary: Negative for decreased urine volume, difficulty urinating and hematuria.  Musculoskeletal: Positive for arthralgias. Negative for gait problem.  Skin: Negative for rash and wound.  Neurological: Negative for syncope, weakness, numbness and headaches.      Current Outpatient Prescriptions on File Prior to Visit  Medication Sig Dispense Refill  . blood glucose meter kit and supplies KIT Dispense based on patient and insurance preference. Use up to four times daily as directed. (FOR ICD-9 250.00, 250.01). 1 each 0  . ferrous sulfate 325 (65 FE) MG tablet Take 1 tablet (325 mg total) by mouth 2 (two) times daily with a meal. 180 tablet 1  . Glucose Blood (BLOOD GLUCOSE TEST STRIPS) STRP Test once a day. Please provide patient with a meter based on her insurance 100  each 1  . Lancets MISC Test once a day. Lancets is based on patient insurance of what meter she can have 100 each 1  . Olmesartan-Amlodipine-HCTZ 40-5-25 MG TABS Take 1 tablet by mouth daily. 90 tablet 0  . sitaGLIPtin-metformin (JANUMET) 50-1000 MG tablet Take 1 tablet by mouth 2 (two) times daily with a meal. 60 tablet 3   No current facility-administered medications on file prior to visit.      Past Medical History:  Diagnosis Date  .  Anemia   . CHF (congestive heart failure) ([place])   . Diabetes mellitus   . Heart murmur   . Hypertension   . Pneumonia    No Known Allergies  Family History  Problem Relation Age of Onset  . Hypertension Mother   . Hypertension Brother     Social History   Social History  . Marital status: Married    Spouse name: N/A  . Number of children: N/A  . Years of education: N/A   Occupational History  . unemployed    Social History Main Topics  . Smoking status: Never Smoker  . Smokeless tobacco: Never Used  . Alcohol use Yes     Comment: occasional  . Drug use: No  . Sexual activity: Yes   Other Topics Concern  . None   Social History Narrative   Lives with her 4 children.    Vitals:   04/22/16 1556  BP: 134/80  Pulse: 96  Resp: 12   O2 sat 98% at RA.  Body mass index is 37.16 kg/m.   Physical Exam  Nursing note and vitals reviewed. Constitutional: She is oriented to person, place, and time. She appears well-developed. No distress.  HENT:  Head: Atraumatic.  Mouth/Throat: Oropharynx is clear and moist and mucous membranes are normal.  Eyes: Conjunctivae and EOM are normal. Pupils are equal, round, and reactive to light.  Cardiovascular: Normal rate and regular rhythm.   Murmur (SEM I/VI LUSB) heard. Pulses:      Dorsalis pedis pulses are 2+ on the right side, and 2+ on the left side.  Respiratory: Effort normal and breath sounds normal. No respiratory distress.  GI: Soft. There is no hepatomegaly. There is no tenderness.  Pelvic mass palpated left suprapubic, no tender (Hx of fibroids)  Musculoskeletal: She exhibits edema (1+ pitting LE edema, bilateral). She exhibits no tenderness.       Left foot: There is no bony tenderness and no swelling.  Varus abnormality 5th right toe. No tenderness upon palpation, no skin lesions.  Lymphadenopathy:    She has no cervical adenopathy.  Neurological: She is alert and oriented to person, place, and time. She has  normal strength. Coordination and gait normal.  Skin: Skin is warm. No rash noted. No erythema.  Psychiatric: She has a normal mood and affect.  Well groomed, good eye contact.   Diabetic foot exam:  Monofilament normal bilateral. Peripheral pulses present (DP). No calluses No hypertrophic/long toenails.    ASSESSMENT AND PLAN:     Nikkole was seen today for establish care.  Diagnoses and all orders for this visit:   Type 2 diabetes mellitus without complication, without long-term current use of insulin ([place])  Not well controlled. Complications of poorly controlled DM 2 discussed. No changes in current management. Regular exercise and healthy diet with avoidance of added sugar food intake is an important part of treatment and recommended. Due for eye exam, also periodic dental and foot care recommended. F/U in 2-3  months  HYPERTENSION, BENIGN ESSENTIAL  Overall dequately controlled. No changes in current management. Low salt diet recommended. Eye exam recommended at least annually. F/U in 3 months, before if needed.  Iron deficiency anemia, unspecified iron deficiency anemia type  Hx of heavy menses. No changes in current management.  -     Ambulatory referral to Gastroenterology  BMI 37.0-37.9, adult  We discussed benefits of wt loss as well as adverse effects of obesity. Consistency with healthy diet and physical activity recommended.  Arthralgia of toe, right  ? OA. Wide comfortable shoe. Foot care discussed. Podiatrist evaluation can be considered if persistent.   Need for 23-polyvalent pneumococcal polysaccharide vaccine -     Pneumococcal polysaccharide vaccine 23-valent greater than or equal to 2yo subcutaneous/IM           [name] G. Martinique, MD  [facility]. [place] office.

## 2016-04-22 NOTE — Progress Notes (Signed)
Pre visit review using our clinic review tool, if applicable. No additional management support is needed unless otherwise documented below in the visit note. 

## 2016-04-22 NOTE — Patient Instructions (Addendum)
A few things to remember from today's visit:   HYPERTENSION, BENIGN ESSENTIAL  Type 2 diabetes mellitus without complication, without long-term current use of insulin ([facility])  Iron deficiency anemia, unspecified iron deficiency anemia type - Plan: Ambulatory referral to Gastroenterology  HgA1C goal < 7.0. Avoid sugar added food:regular soft drinks, energy drinks, and sports drinks. candy. cakes. cookies. pies and cobblers. sweet rolls, pastries, and donuts. fruit drinks, such as fruitades and fruit punch. dairy desserts, such as ice cream  Mediterranean diet has showed benefits for sugar control.  How much and what type of carbohydrate foods are important for managing diabetes. The balance between how much insulin is in your body and the carbohydrate you eat makes a difference in your blood glucose levels.  Fasting blood sugar ideally 130 or less, 2 hours after meals less than 180.   Regular exercise also will help with controlling disease, daily brisk walking as tolerated for 15-30 min definitively will help.   Avoid skipping meals, blood sugar might drop and cause serious problems. Remember checking feet periodically, good dental hygiene, and annual eye exam.      Please be sure medication list is accurate. If a new problem present, please set up appointment sooner than planned today.

## 2016-06-24 ENCOUNTER — Encounter: Payer: Self-pay | Admitting: Family Medicine

## 2016-06-28 ENCOUNTER — Ambulatory Visit: Payer: BC Managed Care – PPO | Admitting: Family Medicine

## 2016-07-02 ENCOUNTER — Other Ambulatory Visit: Payer: Self-pay | Admitting: Medical

## 2016-07-02 DIAGNOSIS — I10 Essential (primary) hypertension: Secondary | ICD-10-CM

## 2016-07-02 NOTE — Telephone Encounter (Signed)
Pt made aware to contact new PCP Dr. Martinique for refills.

## 2016-07-03 ENCOUNTER — Telehealth: Payer: Self-pay | Admitting: Internal Medicine

## 2016-07-03 DIAGNOSIS — I10 Essential (primary) hypertension: Secondary | ICD-10-CM

## 2016-07-03 MED ORDER — OLMESARTAN-AMLODIPINE-HCTZ 40-5-25 MG PO TABS: 1.0000 | ORAL_TABLET | Freq: Every day | ORAL | 1 refills | Status: DC

## 2016-07-03 NOTE — Telephone Encounter (Signed)
Rx sent 

## 2016-07-03 NOTE — Telephone Encounter (Signed)
Pt need new Rx for olmesartan-amlodipine  HCTZ    Pharm:  [address]  Pt state that she is out of her Rx

## 2016-08-01 NOTE — Progress Notes (Deleted)
      HPI:   [name] is a 51 y.o. female, who is here today to follow on some chronic medical problems.  Last seen on 12/ Diabetes Mellitus II:    Currently on Janumet 50-1000 mg. Problem has been stable. Checking BS's : *** Hypoglycemia:  *** is tolerating medications well. *** denies abdominal pain, nausea, vomiting, polydipsia, polyuria, or polyphagia. ***numbness, tingling, or burning.     Lab Results  Component Value Date   CREATININE 0.60 03/13/2016   BUN 9 03/13/2016   NA 133 (L) 03/13/2016   K 3.7 03/13/2016   CL 96 (L) 03/13/2016   CO2 26 03/13/2016    Lab Results  Component Value Date   HGBA1C 10.2 (H) 03/12/2016   Lab Results  Component Value Date   MICROALBUR 2.19 (H) 01/25/2014    HTN: Denies severe/frequent headache, visual changes, chest pain, dyspnea, palpitation, claudication, focal weakness, or edema.  On Olmesartan-Amlodipine-HCTZ 40-5-25 mg daily.      Review of Systems    Current Outpatient Prescriptions on File Prior to Visit  Medication Sig Dispense Refill  . blood glucose meter kit and supplies KIT Dispense based on patient and insurance preference. Use up to four times daily as directed. (FOR ICD-9 250.00, 250.01). 1 each 0  . ferrous sulfate 325 (65 FE) MG tablet Take 1 tablet (325 mg total) by mouth 2 (two) times daily with a meal. 180 tablet 1  . Glucose Blood (BLOOD GLUCOSE TEST STRIPS) STRP Test once a day. Please provide patient with a meter based on her insurance 100 each 1  . Lancets MISC Test once a day. Lancets is based on patient insurance of what meter she can have 100 each 1  . Olmesartan-Amlodipine-HCTZ 40-5-25 MG TABS Take 1 tablet by mouth daily. 90 tablet 1  . sitaGLIPtin-metformin (JANUMET) 50-1000 MG tablet Take 1 tablet by mouth 2 (two) times daily with a meal. 60 tablet 3   No current facility-administered medications on file prior to visit.      Past Medical History:  Diagnosis Date  .  Anemia   . CHF (congestive heart failure) ([place])   . Diabetes mellitus   . Heart murmur   . Hypertension   . Pneumonia    No Known Allergies  Social History   Social History  . Marital status: Married    Spouse name: N/A  . Number of children: N/A  . Years of education: N/A   Occupational History  . unemployed    Social History Main Topics  . Smoking status: Never Smoker  . Smokeless tobacco: Never Used  . Alcohol use Yes     Comment: occasional  . Drug use: No  . Sexual activity: Yes   Other Topics Concern  . Not on file   Social History Narrative   Lives with her 4 children.    There were no vitals filed for this visit. There is no height or weight on file to calculate BMI.      Physical Exam    ASSESSMENT AND PLAN:     There are no diagnoses linked to this encounter.         -Ms. [name] was advised to return sooner than planned today if new concerns arise.       Betty G. Martinique, MD  [facility]. [place] office.

## 2016-08-02 ENCOUNTER — Ambulatory Visit: Payer: BC Managed Care – PPO | Admitting: Family Medicine

## 2016-08-12 NOTE — Progress Notes (Signed)
HPI:   [name] is a 51 y.o. female, who is here today to follow on some chronic medical problems.  Last seen on 04/22/16.   Diabetes Mellitus II:  Currently on Janumet 50-1000 mg.  Checking BS's : She is getting "error" when she tries to check it. Hypoglycemia:Denies.  She has not changed her diet since her last OV, still eating fast food. She is not exercising regularly.  She denies numbness,tingling,abdominal pain, nausea, vomiting, polydipsia, polyuria, or polyphagia.   Lab Results  Component Value Date   CREATININE 0.60 03/13/2016   BUN 9 03/13/2016   NA 133 (L) 03/13/2016   K 3.7 03/13/2016   CL 96 (L) 03/13/2016   CO2 26 03/13/2016    Lab Results  Component Value Date   HGBA1C 10.2 (H) 03/12/2016   Lab Results  Component Value Date   MICROALBUR 2.19 (H) 01/25/2014    HTN: Denies severe/frequent headache, visual changes, chest pain, dyspnea, palpitation, claudication, focal weakness, or edema. She is not checking BP's at home.  On Olmesartan-Amlodipine-HCTZ 40-5-25 mg daily.  Last eye exam about 2 years ago.   Review of Systems  Constitutional: Negative for appetite change, fatigue, fever and unexpected weight change.  HENT: Negative for mouth sores, nosebleeds and trouble swallowing.   Eyes: Negative for redness and visual disturbance.  Respiratory: Negative for cough, shortness of breath and wheezing.   Cardiovascular: Negative for chest pain, palpitations and leg swelling.  Gastrointestinal: Negative for abdominal pain, nausea and vomiting.       Negative for changes in bowel habits.  Endocrine: Negative for cold intolerance, heat intolerance, polydipsia, polyphagia and polyuria.  Genitourinary: Negative for decreased urine volume and hematuria.  Musculoskeletal: Negative for gait problem and myalgias.  Skin: Negative for rash and wound.  Neurological: Negative for syncope, weakness, numbness and headaches.    Psychiatric/Behavioral: Negative for confusion. The patient is not nervous/anxious.       Current Outpatient Prescriptions on File Prior to Visit  Medication Sig Dispense Refill  . blood glucose meter kit and supplies KIT Dispense based on patient and insurance preference. Use up to four times daily as directed. (FOR ICD-9 250.00, 250.01). 1 each 0  . ferrous sulfate 325 (65 FE) MG tablet Take 1 tablet (325 mg total) by mouth 2 (two) times daily with a meal. 180 tablet 1  . Glucose Blood (BLOOD GLUCOSE TEST STRIPS) STRP Test once a day. Please provide patient with a meter based on her insurance 100 each 1  . Lancets MISC Test once a day. Lancets is based on patient insurance of what meter she can have 100 each 1  . sitaGLIPtin-metformin (JANUMET) 50-1000 MG tablet Take 1 tablet by mouth 2 (two) times daily with a meal. 60 tablet 3   No current facility-administered medications on file prior to visit.      Past Medical History:  Diagnosis Date  . Anemia   . CHF (congestive heart failure) ([place])   . Diabetes mellitus   . Heart murmur   . Hypertension   . Pneumonia    No Known Allergies  Social History   Social History  . Marital status: Married    Spouse name: N/A  . Number of children: N/A  . Years of education: N/A   Occupational History  . unemployed    Social History Main Topics  . Smoking status: Never Smoker  . Smokeless tobacco: Never Used  . Alcohol use Yes  Comment: occasional  . Drug use: No  . Sexual activity: Yes   Other Topics Concern  . None   Social History Narrative   Lives with her 4 children.    Vitals:   08/13/16 1031  BP: (!) 144/90  Pulse: 100  Resp: 12   Body mass index is 37.46 kg/m.  Wt Readings from Last 3 Encounters:  08/13/16 218 lb 4 oz (99 kg)  04/22/16 216 lb 8 oz (98.2 kg)  03/13/16 209 lb (94.8 kg)    Physical Exam  Nursing note and vitals reviewed. Constitutional: She is oriented to person, place, and time. She  appears well-developed. No distress.  HENT:  Head: Atraumatic.  Mouth/Throat: Oropharynx is clear and moist and mucous membranes are normal.  Eyes: Conjunctivae and EOM are normal. Pupils are equal, round, and reactive to light.  Neck: No tracheal deviation present. Thyromegaly (? left nodules) present.  Cardiovascular: Normal rate and regular rhythm.   Murmur (SEM I/VI LUSB) heard. Pulses:      Dorsalis pedis pulses are 2+ on the right side, and 2+ on the left side.  Respiratory: Effort normal and breath sounds normal. No respiratory distress.  GI: Soft. There is no hepatomegaly. There is no tenderness.  Musculoskeletal: She exhibits no edema or tenderness.  Lymphadenopathy:    She has no cervical adenopathy.  Neurological: She is alert and oriented to person, place, and time. She has normal strength. Coordination and gait normal.  Skin: Skin is warm. No rash noted. No erythema.  Psychiatric: She has a normal mood and affect.  Well groomed, good eye contact.   Diabetic foot exam:  Monofilament normal bilateral. Peripheral pulses present (DP). + small calluses and varus deformities on some toes. No hypertrophic/long toenails.   ASSESSMENT AND PLAN:   Dariela was seen today for follow-up.  Diagnoses and all orders for this visit:  Type 2 diabetes mellitus with hyperglycemia, without long-term current use of insulin (HCC)  HgA1C pending, problem has been poorly controlled. No changes in current management for now but will consider stopping Januvia and replacing for GLP-1 or other oral agent. Regular exercise and healthy diet with avoidance of added sugar food intake is an important part of treatment and recommended. Annual eye exam (overdue), periodic dental and foot care recommended. F/U in 4 months  -     Microalbumin / creatinine urine ratio -     Hemoglobin A1c -     Fructosamine  Hypertensive heart disease with heart failure (HCC)  Not well controlled,rechecked BP  155/80. Amlodipine increased from 5 mg to 10 mg.Rest unchanged. Possible complications of elevated BP discussed. Annual eye examination. F/U in 3-4 months.   -     Olmesartan-Amlodipine-HCTZ 40-10-25 MG TABS; Take 1 tablet by mouth daily. -     Basic metabolic panel -     Hepatic function panel  Enlarged thyroid gland  Finding on examination discussed. Further recommendations will be given according to lab and imaging results.  -     US THYROID; Future -     TSH  Class 2 obesity due to excess calories with serious comorbidity and body mass index (BMI) of 37.0 to 37.9 in adult  We discussed benefits of wt loss as well as adverse effects of obesity. Consistency with healthy diet and physical activity recommended. Daily brisk walking for 15-30 min as tolerated.     -Ms. Korinne M Lambing was advised to return sooner than planned today if new concerns arise.  Jasiri Hanawalt G. Martinique, MD  [facility]. [place] office.

## 2016-08-13 ENCOUNTER — Ambulatory Visit (INDEPENDENT_AMBULATORY_CARE_PROVIDER_SITE_OTHER): Payer: BC Managed Care – PPO | Admitting: Family Medicine

## 2016-08-13 ENCOUNTER — Encounter: Payer: Self-pay | Admitting: Family Medicine

## 2016-08-13 VITALS — BP 144/90 | HR 100 | Resp 12 | Ht 64.0 in | Wt 218.2 lb

## 2016-08-13 DIAGNOSIS — Z6837 Body mass index (BMI) 37.0-37.9, adult: Secondary | ICD-10-CM | POA: Diagnosis not present

## 2016-08-13 DIAGNOSIS — E6609 Other obesity due to excess calories: Secondary | ICD-10-CM | POA: Diagnosis not present

## 2016-08-13 DIAGNOSIS — E1165 Type 2 diabetes mellitus with hyperglycemia: Secondary | ICD-10-CM | POA: Diagnosis not present

## 2016-08-13 DIAGNOSIS — I110 Hypertensive heart disease with heart failure: Secondary | ICD-10-CM

## 2016-08-13 DIAGNOSIS — E049 Nontoxic goiter, unspecified: Secondary | ICD-10-CM | POA: Diagnosis not present

## 2016-08-13 DIAGNOSIS — IMO0001 Reserved for inherently not codable concepts without codable children: Secondary | ICD-10-CM

## 2016-08-13 LAB — BASIC METABOLIC PANEL
BUN: 16 mg/dL (ref 6–23)
CO2: 29 mEq/L (ref 19–32)
Calcium: 9.5 mg/dL (ref 8.4–10.5)
Chloride: 99 mEq/L (ref 96–112)
Creatinine, Ser: 0.76 mg/dL (ref 0.40–1.20)
GFR: 103.55 mL/min (ref >60.00)
GLUCOSE: 268 mg/dL — AB (ref 70–99)
POTASSIUM: 3.8 meq/L (ref 3.5–5.1)
SODIUM: 138 meq/L (ref 135–145)

## 2016-08-13 LAB — MICROALBUMIN / CREATININE URINE RATIO
CREATININE, U: 154.4 mg/dL
Microalb Creat Ratio: 3.6 mg/g (ref 0.0–30.0)
Microalb, Ur: 5.5 mg/dL — ABNORMAL HIGH (ref 0.0–1.9)

## 2016-08-13 LAB — HEMOGLOBIN A1C: Hgb A1c MFr Bld: 8.9 % — ABNORMAL HIGH (ref 4.6–6.5)

## 2016-08-13 LAB — HEPATIC FUNCTION PANEL
ALBUMIN: 4.1 g/dL (ref 3.5–5.2)
ALT: 8 U/L (ref 0–35)
AST: 14 U/L (ref 0–37)
Alkaline Phosphatase: 113 U/L (ref 39–117)
Bilirubin, Direct: 0 mg/dL (ref 0.0–0.3)
Total Bilirubin: 0.2 mg/dL (ref 0.2–1.2)
Total Protein: 7.8 g/dL (ref 6.0–8.3)

## 2016-08-13 LAB — TSH: TSH: 1.3 u[IU]/mL (ref 0.35–4.50)

## 2016-08-13 MED ORDER — OLMESARTAN-AMLODIPINE-HCTZ 40-10-25 MG PO TABS: 1.0000 | ORAL_TABLET | Freq: Every day | ORAL | 1 refills | Status: DC

## 2016-08-13 NOTE — Patient Instructions (Addendum)
A few things to remember from today's visit:   Type 2 diabetes mellitus with hyperglycemia, without long-term current use of insulin ([place]) - Plan: Microalbumin / creatinine urine ratio, Hemoglobin A1c, Fructosamine  Hypertensive heart disease with heart failure (HCC) - Plan: Olmesartan-Amlodipine-HCTZ 40-10-25 MG TABS, Basic metabolic panel, Hepatic function panel  Enlarged thyroid gland - Plan: US THYROID, TSH  Amlodipine increased to 10 mg.  HgA1C goal < 7.0. Avoid sugar added food:regular soft drinks, energy drinks, and sports drinks. candy. cakes. cookies. pies and cobblers. sweet rolls, pastries, and donuts. fruit drinks, such as fruitades and fruit punch. dairy desserts, such as ice cream  Mediterranean diet has showed benefits for sugar control.  How much and what type of carbohydrate foods are important for managing diabetes. The balance between how much insulin is in your body and the carbohydrate you eat makes a difference in your blood glucose levels.  Fasting blood sugar ideally 130 or less, 2 hours after meals less than 180.   Regular exercise also will help with controlling disease, daily brisk walking as tolerated for 15-30 min definitively will help.   Avoid skipping meals, blood sugar might drop and cause serious problems. Remember checking feet periodically, good dental hygiene, and annual eye exam.  Carbohydrate Counting for Diabetes Mellitus, Adult Carbohydrate counting is a method for keeping track of how many carbohydrates you eat. Eating carbohydrates naturally increases the amount of sugar (glucose) in the blood. Counting how many carbohydrates you eat helps keep your blood glucose within normal limits, which helps you manage your diabetes (diabetes mellitus). It is important to know how many carbohydrates you can safely have in each meal. This is different for every person. A diet and nutrition specialist (registered dietitian) can help you make a meal plan and  calculate how many carbohydrates you should have at each meal and snack. Carbohydrates are found in the following foods:  Grains, such as breads and cereals.  Dried beans and soy products.  Starchy vegetables, such as potatoes, peas, and corn.  Fruit and fruit juices.  Milk and yogurt.  Sweets and snack foods, such as cake, cookies, candy, chips, and soft drinks. How do I count carbohydrates? There are two ways to count carbohydrates in food. You can use either of the methods or a combination of both. Reading "Nutrition Facts" on packaged food  The "Nutrition Facts" list is included on the labels of almost all packaged foods and beverages in the [place] It includes:  The serving size.  Information about nutrients in each serving, including the grams (g) of carbohydrate per serving. To use the "Nutrition Facts":  Decide how many servings you will have.  Multiply the number of servings by the number of carbohydrates per serving.  The resulting number is the total amount of carbohydrates that you will be having. Learning standard serving sizes of other foods  When you eat foods containing carbohydrates that are not packaged or do not include "Nutrition Facts" on the label, you need to measure the servings in order to count the amount of carbohydrates:  Measure the foods that you will eat with a food scale or measuring cup, if needed.  Decide how many standard-size servings you will eat.  Multiply the number of servings by 15. Most carbohydrate-rich foods have about 15 g of carbohydrates per serving.  For example, if you eat 8 oz (170 g) of strawberries, you will have eaten 2 servings and 30 g of carbohydrates (2 servings x 15 g =  30 g).  For foods that have more than one food mixed, such as soups and casseroles, you must count the carbohydrates in each food that is included. The following list contains standard serving sizes of common carbohydrate-rich foods. Each of these servings  has about 15 g of carbohydrates:   hamburger bun or  English muffin.   oz (15 mL) syrup.   oz (14 g) jelly.  1 slice of bread.  1 six-inch tortilla.  3 oz (85 g) cooked rice or pasta.  4 oz (113 g) cooked dried beans.  4 oz (113 g) starchy vegetable, such as peas, corn, or potatoes.  4 oz (113 g) hot cereal.  4 oz (113 g) mashed potatoes or  of a large baked potato.  4 oz (113 g) canned or frozen fruit.  4 oz (120 mL) fruit juice.  4-6 crackers.  6 chicken nuggets.  6 oz (170 g) unsweetened dry cereal.  6 oz (170 g) plain fat-free yogurt or yogurt sweetened with artificial sweeteners.  8 oz (240 mL) milk.  8 oz (170 g) fresh fruit or one small piece of fruit.  24 oz (680 g) popped popcorn. Example of carbohydrate counting Sample meal   3 oz (85 g) chicken breast.  6 oz (170 g) brown rice.  4 oz (113 g) corn.  8 oz (240 mL) milk.  8 oz (170 g) strawberries with sugar-free whipped topping. Carbohydrate calculation  1. Identify the foods that contain carbohydrates:  Rice.  Corn.  Milk.  Strawberries. 2. Calculate how many servings you have of each food:  2 servings rice.  1 serving corn.  1 serving milk.  1 serving strawberries. 3. Multiply each number of servings by 15 g:  2 servings rice x 15 g = 30 g.  1 serving corn x 15 g = 15 g.  1 serving milk x 15 g = 15 g.  1 serving strawberries x 15 g = 15 g. 4. Add together all of the amounts to find the total grams of carbohydrates eaten:  30 g + 15 g + 15 g + 15 g = 75 g of carbohydrates total. This information is not intended to replace advice given to you by your health care provider. Make sure you discuss any questions you have with your health care provider. Document Released: 04/29/2005 Document Revised: 11/17/2015 Document Reviewed: 10/11/2015 Elsevier Interactive Patient Education  2017 [place].     Please be sure medication list is accurate. If a new problem  present, please set up appointment sooner than planned today.

## 2016-08-13 NOTE — Progress Notes (Signed)
Pre visit review using our clinic review tool, if applicable. No additional management support is needed unless otherwise documented below in the visit note. 

## 2016-08-15 ENCOUNTER — Ambulatory Visit
Admission: RE | Admit: 2016-08-15 | Discharge: 2016-08-15 | Disposition: A | Discharge status: Home / Self Care | Payer: BC Managed Care – PPO | Source: Ambulatory Visit | Attending: Family Medicine | Admitting: Family Medicine

## 2016-08-15 DIAGNOSIS — E049 Nontoxic goiter, unspecified: Secondary | ICD-10-CM

## 2016-08-15 LAB — FRUCTOSAMINE: Fructosamine: 387 umol/L — ABNORMAL HIGH (ref 190–270)

## 2016-08-16 ENCOUNTER — Encounter: Payer: Self-pay | Admitting: Family Medicine

## 2016-08-16 ENCOUNTER — Telehealth: Payer: Self-pay | Admitting: Family Medicine

## 2016-08-16 DIAGNOSIS — E041 Nontoxic single thyroid nodule: Secondary | ICD-10-CM | POA: Insufficient documentation

## 2016-08-16 NOTE — Telephone Encounter (Addendum)
Pt would like blood work results °

## 2016-08-19 NOTE — Telephone Encounter (Signed)
Left voicemail for patient to call the office back.  See lab result note.

## 2016-08-19 NOTE — Telephone Encounter (Signed)
Labs were reviewed as well as thyroid U/S. Thanks, BJ

## 2016-08-20 ENCOUNTER — Other Ambulatory Visit: Payer: Self-pay | Admitting: Family Medicine

## 2016-08-20 DIAGNOSIS — E1165 Type 2 diabetes mellitus with hyperglycemia: Secondary | ICD-10-CM

## 2016-08-20 MED ORDER — SITAGLIPTIN PHOS-METFORMIN HCL 50-1000 MG PO TABS: 1.0000 | ORAL_TABLET | Freq: Two times a day (BID) | ORAL | 1 refills | Status: DC

## 2016-08-25 ENCOUNTER — Other Ambulatory Visit: Payer: Self-pay | Admitting: Family Medicine

## 2016-08-25 DIAGNOSIS — E1165 Type 2 diabetes mellitus with hyperglycemia: Secondary | ICD-10-CM

## 2016-08-25 MED ORDER — GLIMEPIRIDE 2 MG PO TABS: 2.0000 mg | ORAL_TABLET | Freq: Every day | ORAL | 3 refills | Status: DC

## 2016-08-26 MED ORDER — GLIMEPIRIDE 2 MG PO TABS: 2.0000 mg | ORAL_TABLET | Freq: Every day | ORAL | 3 refills | Status: DC

## 2016-08-26 NOTE — Addendum Note (Signed)
Addended by: Kateri Mc E on: 08/26/2016 08:08 AM   Modules accepted: Orders

## 2016-12-15 NOTE — Progress Notes (Deleted)
HPI:   NatalieNatalie [name] is a 51 y.o. female, who is here today for 4 months follow up.   She was last seen on 08/13/16  Diabetes Mellitus II:   Dx around 20+ years ago.  Currently on Janumet 50-1000 mg bid and Amaryl 2 mg daily.. Problem has been poorly controlled. Last eye exam: *** Checking BS's : *** Hypoglycemia:Denies  *** is tolerating medications well. *** denies abdominal pain, nausea, vomiting, polydipsia, polyuria, or polyphagia. ***numbness, tingling, or burning.    Lab Results  Component Value Date   HGBA1C 8.9 (H) 08/13/2016   Lab Results  Component Value Date   MICROALBUR 5.5 (H) 08/13/2016   Hypertension:    Currently on Olmesartan-Amlodiipne-HCTZ 40-10-25 mg    She is taking medications as instructed, no side effects reported.  ***has not noted unusual headache, visual changes, exertional chest pain, dyspnea,  focal weakness, or edema.   Lab Results  Component Value Date   CREATININE 0.76 08/13/2016   BUN 16 08/13/2016   NA 138 08/13/2016   K 3.8 08/13/2016   CL 99 08/13/2016   CO2 29 08/13/2016    Obesity:  Dietary changes since her last OV: *** Exercising: ***   Iron def anemia:  Currently she is on Fe Sulfate 325 mg daily ***   Lab Results  Component Value Date   WBC 8.4 03/13/2016   HGB 11.4 (L) 03/13/2016   HCT 36.4 03/13/2016   MCV 73.4 (L) 03/13/2016   PLT 394 03/13/2016    Review of Systems  [review of 2 to 9 systems] ***  Current Outpatient Prescriptions on File Prior to Visit  Medication Sig Dispense Refill  . blood glucose meter kit and supplies KIT Dispense based on patient and insurance preference. Use up to four times daily as directed. (FOR ICD-9 250.00, 250.01). 1 each 0  . ferrous sulfate 325 (65 FE) MG tablet Take 1 tablet (325 mg total) by mouth 2 (two) times daily with a meal. 180 tablet 1  . glimepiride (AMARYL) 2 MG tablet Take 1 tablet (2 mg total) by mouth daily with breakfast. 30 tablet 3    . Glucose Blood (BLOOD GLUCOSE TEST STRIPS) STRP Test once a day. Please provide patient with a meter based on her insurance 100 each 1  . Lancets MISC Test once a day. Lancets is based on patient insurance of what meter she can have 100 each 1  . Olmesartan-Amlodipine-HCTZ 40-10-25 MG TABS Take 1 tablet by mouth daily. 90 tablet 1  . sitaGLIPtin-metformin (JANUMET) 50-1000 MG tablet Take 1 tablet by mouth 2 (two) times daily with a meal. 180 tablet 1   No current facility-administered medications on file prior to visit.      Past Medical History:  Diagnosis Date  . Anemia   . CHF (congestive heart failure) ([place])   . Diabetes mellitus   . Heart murmur   . Hypertension   . Pneumonia    No Known Allergies  Social History   Social History  . Marital status: Married    Spouse name: N/A  . Number of children: N/A  . Years of education: N/A   Occupational History  . unemployed    Social History Main Topics  . Smoking status: Never Smoker  . Smokeless tobacco: Never Used  . Alcohol use Yes     Comment: occasional  . Drug use: No  . Sexual activity: Yes   Other Topics Concern  . Not on  file   Social History Narrative   Lives with her 4 children.    There were no vitals filed for this visit. There is no height or weight on file to calculate BMI.   Wt Readings from Last 3 Encounters:  08/13/16 218 lb 4 oz (99 kg)  04/22/16 216 lb 8 oz (98.2 kg)  03/13/16 209 lb (94.8 kg)     Physical Exam  [12+ exam elements] ***  ASSESSMENT AND PLAN:   [name] was seen today for 4 months follow-up.   Diagnoses and all orders for this visit:  Type 2 diabetes mellitus with hyperglycemia, without long-term current use of insulin (HCC)  Hypertensive heart disease with heart failure (HCC)  Iron deficiency anemia, unspecified iron deficiency anemia type  Class 2 obesity with serious comorbidity and body mass index (BMI) of 37.0 to 37.9 in adult, unspecified  obesity type           -[name] was advised to return sooner than planned today if new concerns arise.       [name] Uriegas G. [place], MD  [facility]. [place] office.

## 2016-12-16 ENCOUNTER — Ambulatory Visit: Payer: Self-pay | Admitting: Family Medicine

## 2016-12-16 DIAGNOSIS — Z0289 Encounter for other administrative examinations: Secondary | ICD-10-CM

## 2017-03-25 ENCOUNTER — Ambulatory Visit: Payer: BC Managed Care – PPO | Admitting: Obstetrics

## 2017-03-31 ENCOUNTER — Encounter: Payer: Self-pay | Admitting: *Deleted

## 2017-03-31 ENCOUNTER — Ambulatory Visit (INDEPENDENT_AMBULATORY_CARE_PROVIDER_SITE_OTHER): Payer: BC Managed Care – PPO | Admitting: Obstetrics

## 2017-03-31 ENCOUNTER — Other Ambulatory Visit: Payer: Self-pay | Admitting: Obstetrics

## 2017-03-31 ENCOUNTER — Encounter: Payer: Self-pay | Admitting: Obstetrics

## 2017-03-31 VITALS — BP 171/103 | HR 105 | Ht 64.0 in | Wt 218.0 lb

## 2017-03-31 DIAGNOSIS — Z01419 Encounter for gynecological examination (general) (routine) without abnormal findings: Secondary | ICD-10-CM

## 2017-03-31 DIAGNOSIS — N939 Abnormal uterine and vaginal bleeding, unspecified: Secondary | ICD-10-CM

## 2017-03-31 DIAGNOSIS — Z1239 Encounter for other screening for malignant neoplasm of breast: Secondary | ICD-10-CM

## 2017-03-31 DIAGNOSIS — I10 Essential (primary) hypertension: Secondary | ICD-10-CM

## 2017-03-31 DIAGNOSIS — Z113 Encounter for screening for infections with a predominantly sexual mode of transmission: Secondary | ICD-10-CM | POA: Diagnosis not present

## 2017-03-31 DIAGNOSIS — Z124 Encounter for screening for malignant neoplasm of cervix: Secondary | ICD-10-CM | POA: Diagnosis not present

## 2017-03-31 DIAGNOSIS — D251 Intramural leiomyoma of uterus: Secondary | ICD-10-CM

## 2017-03-31 DIAGNOSIS — N393 Stress incontinence (female) (male): Secondary | ICD-10-CM

## 2017-03-31 DIAGNOSIS — D500 Iron deficiency anemia secondary to blood loss (chronic): Secondary | ICD-10-CM

## 2017-03-31 DIAGNOSIS — E139 Other specified diabetes mellitus without complications: Secondary | ICD-10-CM

## 2017-03-31 DIAGNOSIS — Z1231 Encounter for screening mammogram for malignant neoplasm of breast: Secondary | ICD-10-CM

## 2017-03-31 DIAGNOSIS — Z8679 Personal history of other diseases of the circulatory system: Secondary | ICD-10-CM

## 2017-03-31 NOTE — Progress Notes (Addendum)
Subjective:        [name] is a 51 y.o. female here for a routine exam.  Current complaints: Heavy periods.  History large uterine fibroids and heavy periods with anemia that has required iron transfusion .  Also has leaking of urine with cough, sneeze, etc.  Has to wear a pad.  Personal health questionnaire:  Is patient Ashkenazi Jewish, have a family history of breast and/or ovarian cancer: no Is there a family history of uterine cancer diagnosed at age < 31, gastrointestinal cancer, urinary tract cancer, family member who is a Field seismologist syndrome-associated carrier: no Is the patient overweight and hypertensive, family history of diabetes, personal history of gestational diabetes, preeclampsia or PCOS: no Is patient over 41, have PCOS,  family history of premature CHD under age 67, diabetes, smoke, have hypertension or peripheral artery disease:  no At any time, has a partner hit, kicked or otherwise hurt or frightened you?: no Over the past 2 weeks, have you felt down, depressed or hopeless?: no Over the past 2 weeks, have you felt little interest or pleasure in doing things?:no   Gynecologic History Patient's last menstrual period was 02/26/2017 (approximate). Contraception: tubal ligation Last Pap: 2014. Results were: normal Last mammogram: 2014. Results were: normal  Obstetric History OB History  Gravida Para Term Preterm AB Living  '6 4 3 1 2 4  ' SAB TAB Ectopic Multiple Live Births    2     4    # Outcome Date GA Lbr Len/2nd Weight Sex Delivery Anes PTL Lv  6 Preterm 10/09/06    M   Y LIV  5 Term 10/29/96     Vag-Spont   LIV  4 TAB 1997          3 Term 02/15/94        LIV  2 Term 04/11/91        LIV  1 TAB 1991              Past Medical History:  Diagnosis Date  . Anemia   . CHF (congestive heart failure) ([place])   . Diabetes mellitus   . Heart murmur   . Hypertension   . Pneumonia     Past Surgical History:  Procedure Laterality Date  . CESAREAN SECTION     . TUBAL LIGATION       Current Outpatient Medications:  .  ferrous sulfate 325 (65 FE) MG tablet, Take 1 tablet (325 mg total) by mouth 2 (two) times daily with a meal., Disp: 180 tablet, Rfl: 1 .  Olmesartan-Amlodipine-HCTZ 40-10-25 MG TABS, Take 1 tablet by mouth daily., Disp: 90 tablet, Rfl: 1 .  sitaGLIPtin-metformin (JANUMET) 50-1000 MG tablet, Take 1 tablet by mouth 2 (two) times daily with a meal. (Patient taking differently: Take 1 tablet daily by mouth. ), Disp: 180 tablet, Rfl: 1 .  blood glucose meter kit and supplies KIT, Dispense based on patient and insurance preference. Use up to four times daily as directed. (FOR ICD-9 250.00, 250.01). (Patient not taking: Reported on 03/31/2017), Disp: 1 each, Rfl: 0 .  Glucose Blood (BLOOD GLUCOSE TEST STRIPS) STRP, Test once a day. Please provide patient with a meter based on her insurance (Patient not taking: Reported on 03/31/2017), Disp: 100 each, Rfl: 1 .  Lancets MISC, Test once a day. Lancets is based on patient insurance of what meter she can have (Patient not taking: Reported on 03/31/2017), Disp: 100 each, Rfl: 1 No Known Allergies  Social History   Tobacco Use  . Smoking status: Never Smoker  . Smokeless tobacco: Never Used  Substance Use Topics  . Alcohol use: Yes    Comment: occasional    Family History  Problem Relation Age of Onset  . Hypertension Mother   . Hypertension Brother       Review of Systems  Constitutional: negative for fatigue and weight loss Respiratory: negative for cough and wheezing Cardiovascular: negative for chest pain, fatigue and palpitations Gastrointestinal: negative for abdominal pain and change in bowel habits Musculoskeletal:negative for myalgias Neurological: negative for gait problems and tremors Behavioral/Psych: negative for abusive relationship, depression Endocrine: negative for temperature intolerance    Genitourinary:positive for heavy and painful periods; and leaking  urine Integument/breast: negative for breast lump, breast tenderness, nipple discharge and skin lesion(s)    Objective:       BP (!) 171/103   Pulse (!) 105   Ht '5\' 4"'  (1.626 m)   Wt 218 lb (98.9 kg)   LMP 02/26/2017 (Approximate)   BMI 37.42 kg/m  General:   alert  Skin:   no rash or abnormalities  Lungs:   clear to auscultation bilaterally  Heart:   regular rate and rhythm, S1, S2 normal, no murmur, click, rub or gallop  Breasts:   normal without suspicious masses, skin or nipple changes or axillary nodes  Abdomen:  normal findings: no organomegaly, soft, non-tender and no hernia  Pelvis:  External genitalia: normal general appearance Urinary system: urethral meatus normal and bladder without fullness, nontender Vaginal: normal without tenderness, induration or masses Cervix: normal appearance Adnexa: normal bimanual exam Uterus: anteverted and non-tender, enlatged   Lab Review Urine pregnancy test Labs reviewed yes Radiologic studies reviewed yes  50% of 20 min visit spent on counseling and coordination of care.    Assessment:     1. Encounter for routine gynecological examination with Papanicolaou smear of cervix Rx: - Cervicovaginal ancillary only - Cytology - PAP - TSH - Comprehensive metabolic panel  2. Fibroids, intramural - Leiomyoma Rx: - CBC - US PELVIC COMPLETE WITH TRANSVAGINAL; Future  3. Abnormal uterine bleeding (AUB) Rx: - CBC  4. Iron deficiency anemia due to chronic blood loss  Rx: -Ferralet 90 -   5. SUI (stress urinary incontinence, female) Rx: - Ambulatory referral to Urogynecology  6. 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 HTN (hypertension), benign - followed by PCP  9. Screening for STD (sexually transmitted disease)   10. Screening breast examination Rx: - MM Digital Screening; Future   Plan:    Education reviewed: calcium supplements, depression evaluation, low  fat, low cholesterol diet, safe sex/STD prevention, self breast exams and weight bearing exercise. Mammogram ordered. Follow up in: 2 weeks.   No orders of the defined types were placed in this encounter.  Orders Placed This Encounter  Procedures  . US PELVIC COMPLETE WITH TRANSVAGINAL    Epic order  Wt. 218/ no needs Ins- bcbs Pda/pt=- aneetra @ [facility] [phone]    Standing Status:   Future    Standing Expiration Date:   05/31/2018    Order Specific Question:   Reason for Exam (SYMPTOM  OR DIAGNOSIS REQUIRED)    Answer:   Fibroids    Order Specific Question:   Preferred imaging location?    Answer:   [facility]  . TSH  . Comprehensive metabolic panel  . CBC  .  Ambulatory referral to Urogynecology    Referral Priority:   Routine    Referral Type:   Consultation    Referral Reason:   Specialty Services Required    Requested Specialty:   Urology    Number of Visits Requested:   1

## 2017-04-01 ENCOUNTER — Telehealth: Payer: Self-pay | Admitting: Pediatrics

## 2017-04-01 LAB — COMPREHENSIVE METABOLIC PANEL
ALBUMIN: 4.2 g/dL (ref 3.5–5.5)
ALK PHOS: 116 IU/L (ref 39–117)
ALT: 11 IU/L (ref 0–32)
AST: 22 IU/L (ref 0–40)
Albumin/Globulin Ratio: 1.2 (ref 1.2–2.2)
BUN / CREAT RATIO: 18 (ref 9–23)
BUN: 18 mg/dL (ref 6–24)
Bilirubin Total: <0.2 mg/dL (ref 0.0–1.2)
CALCIUM: 9.4 mg/dL (ref 8.7–10.2)
CO2: 21 mmol/L (ref 20–29)
CREATININE: 0.99 mg/dL (ref 0.57–1.00)
Chloride: 99 mmol/L (ref 96–106)
GFR calc Af Amer: 77 mL/min/{1.73_m2} (ref >59)
GFR, EST NON AFRICAN AMERICAN: 67 mL/min/{1.73_m2} (ref >59)
GLOBULIN, TOTAL: 3.6 g/dL (ref 1.5–4.5)
GLUCOSE: 154 mg/dL — AB (ref 65–99)
Potassium: 4.3 mmol/L (ref 3.5–5.2)
SODIUM: 138 mmol/L (ref 134–144)
TOTAL PROTEIN: 7.8 g/dL (ref 6.0–8.5)

## 2017-04-01 LAB — TSH: TSH: 1.32 u[IU]/mL (ref 0.450–4.500)

## 2017-04-01 NOTE — Telephone Encounter (Signed)
I attempted to contact pt.  Per [facility] she needs to have CBC re-drawn d/t clot in tube. LMTCB

## 2017-04-02 LAB — CERVICOVAGINAL ANCILLARY ONLY
Bacterial vaginitis: POSITIVE — AB
CHLAMYDIA, DNA PROBE: NEGATIVE
Candida vaginitis: NEGATIVE
NEISSERIA GONORRHEA: NEGATIVE
TRICH (WINDOWPATH): NEGATIVE

## 2017-04-04 ENCOUNTER — Other Ambulatory Visit: Payer: Self-pay | Admitting: Obstetrics

## 2017-04-05 ENCOUNTER — Other Ambulatory Visit: Payer: Self-pay | Admitting: Obstetrics

## 2017-04-05 DIAGNOSIS — B9689 Other specified bacterial agents as the cause of diseases classified elsewhere: Secondary | ICD-10-CM

## 2017-04-05 DIAGNOSIS — N760 Acute vaginitis: Principal | ICD-10-CM

## 2017-04-05 LAB — CYTOLOGY - PAP
DIAGNOSIS: NEGATIVE
HPV: NOT DETECTED

## 2017-04-05 MED ORDER — METRONIDAZOLE 500 MG PO TABS: 500.0000 mg | ORAL_TABLET | Freq: Two times a day (BID) | ORAL | 2 refills | Status: DC

## 2017-04-07 ENCOUNTER — Telehealth: Payer: Self-pay

## 2017-04-07 NOTE — Telephone Encounter (Signed)
-----   Message from Shelly Bombard, MD sent at 04/05/2017  8:40 AM EST ----- Flagyl Rx

## 2017-04-07 NOTE — Telephone Encounter (Signed)
Left message on VM to call office.

## 2017-04-08 ENCOUNTER — Encounter: Payer: Self-pay | Admitting: Obstetrics

## 2017-04-09 ENCOUNTER — Ambulatory Visit
Admission: RE | Admit: 2017-04-09 | Discharge: 2017-04-09 | Disposition: A | Discharge status: Home / Self Care | Payer: BC Managed Care – PPO | Source: Ambulatory Visit | Attending: Obstetrics | Admitting: Obstetrics

## 2017-04-09 DIAGNOSIS — D251 Intramural leiomyoma of uterus: Secondary | ICD-10-CM

## 2017-04-10 ENCOUNTER — Other Ambulatory Visit: Payer: Self-pay | Admitting: Obstetrics

## 2017-04-14 NOTE — Telephone Encounter (Signed)
Left message for pt to return call to office.

## 2017-04-16 NOTE — Telephone Encounter (Signed)
Pt informed. Pt states that she will this redrawn on 12/12

## 2017-04-23 ENCOUNTER — Ambulatory Visit (INDEPENDENT_AMBULATORY_CARE_PROVIDER_SITE_OTHER): Payer: BC Managed Care – PPO | Admitting: Obstetrics

## 2017-04-23 VITALS — Wt 225.0 lb

## 2017-04-23 DIAGNOSIS — E139 Other specified diabetes mellitus without complications: Secondary | ICD-10-CM

## 2017-04-23 DIAGNOSIS — D500 Iron deficiency anemia secondary to blood loss (chronic): Secondary | ICD-10-CM | POA: Diagnosis not present

## 2017-04-23 DIAGNOSIS — Z8679 Personal history of other diseases of the circulatory system: Secondary | ICD-10-CM | POA: Diagnosis not present

## 2017-04-23 DIAGNOSIS — N939 Abnormal uterine and vaginal bleeding, unspecified: Secondary | ICD-10-CM | POA: Diagnosis not present

## 2017-04-23 DIAGNOSIS — N393 Stress incontinence (female) (male): Secondary | ICD-10-CM | POA: Diagnosis not present

## 2017-04-23 DIAGNOSIS — D251 Intramural leiomyoma of uterus: Secondary | ICD-10-CM

## 2017-04-23 DIAGNOSIS — E109 Type 1 diabetes mellitus without complications: Secondary | ICD-10-CM | POA: Diagnosis not present

## 2017-04-23 DIAGNOSIS — I10 Essential (primary) hypertension: Secondary | ICD-10-CM | POA: Diagnosis not present

## 2017-04-24 ENCOUNTER — Encounter: Payer: Self-pay | Admitting: Obstetrics

## 2017-04-24 LAB — CBC
HEMOGLOBIN: 9.6 g/dL — AB (ref 11.1–15.9)
Hematocrit: 32.1 % — ABNORMAL LOW (ref 34.0–46.6)
MCH: 21.6 pg — AB (ref 26.6–33.0)
MCHC: 29.9 g/dL — AB (ref 31.5–35.7)
MCV: 72 fL — AB (ref 79–97)
PLATELETS: 426 10*3/uL — AB (ref 150–379)
RBC: 4.44 x10E6/uL (ref 3.77–5.28)
RDW: 23.1 % — ABNORMAL HIGH (ref 12.3–15.4)
WBC: 7.3 10*3/uL (ref 3.4–10.8)

## 2017-04-24 NOTE — Progress Notes (Addendum)
Patient ID: [name], female   DOB: 04-Jan-1966, 51 y.o.   MRN: [id number]  Chief Complaint  Patient presents with  . Results    HPI [name] is a 51 y.o. female.  History of uterine fibroids and AUB.  Presents for ultrasound results. HPI  Past Medical History:  Diagnosis Date  . Anemia   . CHF (congestive heart failure) ([place])   . Diabetes mellitus   . Heart murmur   . Hypertension   . Pneumonia     Past Surgical History:  Procedure Laterality Date  . CESAREAN SECTION    . TUBAL LIGATION      Family History  Problem Relation Age of Onset  . Hypertension Mother   . Hypertension Brother     Social History Social History   Tobacco Use  . Smoking status: Never Smoker  . Smokeless tobacco: Never Used  Substance Use Topics  . Alcohol use: Yes    Comment: occasional  . Drug use: No    No Known Allergies  Current Outpatient Medications  Medication Sig Dispense Refill  . blood glucose meter kit and supplies KIT Dispense based on patient and insurance preference. Use up to four times daily as directed. (FOR ICD-9 250.00, 250.01). 1 each 0  . ferrous sulfate 325 (65 FE) MG tablet Take 1 tablet (325 mg total) by mouth 2 (two) times daily with a meal. 180 tablet 1  . Glucose Blood (BLOOD GLUCOSE TEST STRIPS) STRP Test once a day. Please provide patient with a meter based on her insurance 100 each 1  . Lancets MISC Test once a day. Lancets is based on patient insurance of what meter she can have 100 each 1  . metroNIDAZOLE (FLAGYL) 500 MG tablet Take 1 tablet (500 mg total) by mouth 2 (two) times daily. 14 tablet 2  . Olmesartan-Amlodipine-HCTZ 40-10-25 MG TABS Take 1 tablet by mouth daily. 90 tablet 1  . sitaGLIPtin-metformin (JANUMET) 50-1000 MG tablet Take 1 tablet by mouth 2 (two) times daily with a meal. (Patient taking differently: Take 1 tablet daily by mouth. ) 180 tablet 1   No current facility-administered medications for this visit.     Review of  Systems Review of Systems Constitutional: negative for fatigue and weight loss Respiratory: negative for cough and wheezing Cardiovascular: negative for chest pain, fatigue and palpitations Gastrointestinal: negative for abdominal pain and change in bowel habits Genitourinary:positive for heavy periods Integument/breast: negative for nipple discharge Musculoskeletal:negative for myalgias Neurological: negative for gait problems and tremors Behavioral/Psych: negative for abusive relationship, depression Endocrine: negative for temperature intolerance      Weight 225 lb (102.1 kg).  Physical Exam Physical Exam >50% of 15 min visit spent on counseling and coordination of care.   Data Reviewed CBC: Results for [name], [name] (MRN [id number]) as of 04/24/2017 06:05  Ref. Range 04/23/2017 17:13  WBC Latest Ref Range: 3.4 - 10.8 x10E3/uL 7.3  RBC Latest Ref Range: 3.77 - 5.28 x10E6/uL 4.44  Hemoglobin Latest Ref Range: 11.1 - 15.9 g/dL 9.6 (L)  HCT Latest Ref Range: 34.0 - 46.6 % 32.1 (L)  MCV Latest Ref Range: 79 - 97 fL 72 (L)  MCH Latest Ref Range: 26.6 - 33.0 pg 21.6 (L)  MCHC Latest Ref Range: 31.5 - 35.7 g/dL 29.9 (L)  RDW Latest Ref Range: 12.3 - 15.4 % 23.1 (H)  Platelets Latest Ref Range: 150 - 379 x10E3/uL 426 (H)   Ultrasound:     US PELVIC COMPLETE  WITH TRANSVAGINAL (Accession [id number]) (Order [id number])  Imaging  Date: 04/09/2017 Department: [facility] Released By: Gasper Lloyd Authorizing: Shelly Bombard, MD  Exam Information   Status Exam Begun  Exam Ended   Final [99] 04/09/2017 3:54 PM 04/09/2017 4:43 PM  PACS Images   Show images for US PELVIC COMPLETE WITH TRANSVAGINAL  Study Result   CLINICAL DATA:  Follow-up fibroids.  EXAM: TRANSABDOMINAL AND TRANSVAGINAL ULTRASOUND OF PELVIS  TECHNIQUE: Both transabdominal and transvaginal ultrasound examinations of the pelvis were performed. Transabdominal technique  was performed for global imaging of the pelvis including uterus, ovaries, adnexal regions, and pelvic cul-de-sac. It was necessary to proceed with endovaginal exam following the transabdominal exam to visualize the endometrium and over.  COMPARISON:  08/15/2016  FINDINGS: Uterus  Measurements: Given the enlargement and heterogeneity of the uterus, measurements are difficult. Uterus is estimated to be at least 10.8 x 10.7 x10.3 cm 2 discrete central uterine fibroids are measured. These are 2.0 x 1.8 x 2.1 cm and 3.0 x 2.6 x 3.2 cm.  Endometrium  Thickness: The endometrium is obscured by multiple fibroids.  Right ovary  Measurements: 3.7 x 2.7 x 2.4 cm.  Normal in appearance.  Left ovary  Measurements: The left ovary is obscured.  Other findings  No abnormal free fluid.  IMPRESSION: 1. Multiple uterine fibroids enlarging the uterus and limiting and evaluation of the endometrium. 2. Measurements of discrete fibroids and overall uterine measurements are also limited. 3. If needed for surgical planning, MRI would be helpful. 4. Right ovary has a normal appearance. The left ovary is not visualized.   Electronically Signed   By: Nolon Nations M.D.   On: 04/09/2017 17:03   Assessment     1. Fibroids, intramural - management options discussed  2. Iron deficiency anemia due to chronic blood loss Rx: - CBC - iron and vitamins dispensed  3. Abnormal uterine bleeding (AUB) - management options discussed  4. SUI (stress urinary incontinence, female) - referred to Urogyn  5. Diabetes 1.5, managed as type 2 ([place]) - managed by PCP  6. HTN (hypertension), benign - managed by PCP  7. H/O CHF - managed by PCP    Plan    Follow up in 2 months  Orders Placed This Encounter  Procedures  . CBC   No orders of the defined types were placed in this encounter.

## 2017-04-29 ENCOUNTER — Ambulatory Visit
Admission: RE | Admit: 2017-04-29 | Discharge: 2017-04-29 | Disposition: A | Discharge status: Home / Self Care | Payer: BC Managed Care – PPO | Source: Ambulatory Visit | Attending: Obstetrics | Admitting: Obstetrics

## 2017-04-29 DIAGNOSIS — Z1231 Encounter for screening mammogram for malignant neoplasm of breast: Secondary | ICD-10-CM

## 2017-04-30 ENCOUNTER — Other Ambulatory Visit: Payer: Self-pay | Admitting: Obstetrics

## 2017-04-30 DIAGNOSIS — R928 Other abnormal and inconclusive findings on diagnostic imaging of breast: Secondary | ICD-10-CM

## 2017-05-07 ENCOUNTER — Ambulatory Visit
Admission: RE | Admit: 2017-05-07 | Discharge: 2017-05-07 | Disposition: A | Discharge status: Home / Self Care | Payer: BC Managed Care – PPO | Source: Ambulatory Visit | Attending: Obstetrics | Admitting: Obstetrics

## 2017-05-07 DIAGNOSIS — R928 Other abnormal and inconclusive findings on diagnostic imaging of breast: Secondary | ICD-10-CM

## 2018-01-19 ENCOUNTER — Encounter: Payer: Self-pay | Admitting: Family Medicine

## 2018-01-19 ENCOUNTER — Ambulatory Visit: Payer: BC Managed Care – PPO | Admitting: Family Medicine

## 2018-01-19 ENCOUNTER — Ambulatory Visit (INDEPENDENT_AMBULATORY_CARE_PROVIDER_SITE_OTHER): Payer: BC Managed Care – PPO

## 2018-01-19 ENCOUNTER — Encounter: Payer: Self-pay | Admitting: *Deleted

## 2018-01-19 VITALS — BP 165/90 | HR 104 | Temp 98.1°F | Resp 16 | Ht 64.0 in | Wt 216.0 lb

## 2018-01-19 DIAGNOSIS — M549 Dorsalgia, unspecified: Secondary | ICD-10-CM | POA: Diagnosis not present

## 2018-01-19 DIAGNOSIS — J069 Acute upper respiratory infection, unspecified: Secondary | ICD-10-CM | POA: Diagnosis not present

## 2018-01-19 DIAGNOSIS — R05 Cough: Secondary | ICD-10-CM

## 2018-01-19 DIAGNOSIS — I110 Hypertensive heart disease with heart failure: Secondary | ICD-10-CM | POA: Diagnosis not present

## 2018-01-19 DIAGNOSIS — R059 Cough, unspecified: Secondary | ICD-10-CM

## 2018-01-19 MED ORDER — AMLODIPINE BESYLATE 5 MG PO TABS: 5.0000 mg | ORAL_TABLET | Freq: Every day | ORAL | 0 refills | Status: DC

## 2018-01-19 MED ORDER — HYDROCODONE-HOMATROPINE 5-1.5 MG/5ML PO SYRP: 5.0000 mL | ORAL_SOLUTION | Freq: Two times a day (BID) | ORAL | 0 refills | Status: AC | PRN

## 2018-01-19 MED ORDER — BENZONATATE 100 MG PO CAPS: 200.0000 mg | ORAL_CAPSULE | Freq: Two times a day (BID) | ORAL | 0 refills | Status: AC | PRN

## 2018-01-19 NOTE — Progress Notes (Signed)
ACUTE VISIT  HPI:  Chief Complaint  Patient presents with  . Cough    x 1 week     [name] is a 52 y.o.female here today with her husband complaining of 5-7 days of respiratory symptoms.  Productive cough with "some phlegm." Nasal congestion and rhinorrhea. "Tearing eyes", no purulent discharge, pruritus, or conjunctival erythema. For the past couple days she has had mid back pain when coughing. She has not noted fever but had some night sweats last week.  Cough is worse when lying down, it is keeping her from sleep. Negative for wheezing and dyspnea.  She also has had intermittent episodes of diarrhea, today she has had 3.  Has not identified exacerbating or alleviating factors.  Denies abdominal pain, nausea, vomiting, or urinary symptoms.  Denies recent antibiotic use.   Cough  This is a new problem. The current episode started in the past 7 days. The problem has been unchanged. The problem occurs every few minutes. The cough is productive of sputum. Associated symptoms include myalgias, nasal congestion, postnasal drip, rhinorrhea and sweats. Pertinent negatives include no chills, ear congestion, ear pain, eye redness, fever, headaches, heartburn, hemoptysis, rash, sore throat, shortness of breath or wheezing. The symptoms are aggravated by lying down. She has tried OTC cough suppressant for the symptoms. The treatment provided mild relief. There is no history of asthma, COPD or environmental allergies.     No Hx of recent travel. Her son was sick a week before her symptoms are started, he had a cold.   No known insect bite.  Hx of allergies: Denies  OTC medications for this problem: Coricidin  DM II,she is not checking BS's.   Hypertension:   She is also concerned about elevated BP's. According to patient she visited another provider, BP was 196/103 in 10/2017. She denies checking BP at home.  She last followed in 08/2016. Currently on  Benicar HCT, states that provider discontinued Amlodipine 10 mg a few months ago.Deneis side effects.  She has not noted unusual headache, visual changes, exertional chest pain, dyspnea,  focal weakness, or edema.   Lab Results  Component Value Date   CREATININE 0.99 03/31/2017   BUN 18 03/31/2017   NA 138 03/31/2017   K 4.3 03/31/2017   CL 99 03/31/2017   CO2 21 03/31/2017    Review of Systems  Constitutional: Positive for appetite change and fatigue. Negative for activity change, chills and fever.  HENT: Positive for congestion, postnasal drip and rhinorrhea. Negative for ear pain, mouth sores, sore throat, trouble swallowing and voice change.   Eyes: Positive for discharge. Negative for redness and itching.  Respiratory: Positive for cough. Negative for hemoptysis, shortness of breath and wheezing.   Gastrointestinal: Positive for diarrhea. Negative for abdominal pain, heartburn, nausea and vomiting.  Musculoskeletal: Positive for myalgias. Negative for gait problem.  Skin: Negative for rash.  Allergic/Immunologic: Negative for environmental allergies.  Neurological: Negative for syncope, weakness and headaches.  Hematological: Negative for adenopathy. Does not bruise/bleed easily.  Psychiatric/Behavioral: Positive for sleep disturbance. Negative for confusion.      Current Outpatient Medications on File Prior to Visit  Medication Sig Dispense Refill  . blood glucose meter kit and supplies KIT Dispense based on patient and insurance preference. Use up to four times daily as directed. (FOR ICD-9 250.00, 250.01). 1 each 0  . ferrous sulfate 325 (65 FE) MG tablet Take 1 tablet (325 mg total) by mouth 2 (two)  times daily with a meal. 180 tablet 1  . Glucose Blood (BLOOD GLUCOSE TEST STRIPS) STRP Test once a day. Please provide patient with a meter based on her insurance 100 each 1  . Lancets MISC Test once a day. Lancets is based on patient insurance of what meter she can have      . metroNIDAZOLE (FLAGYL) 500 MG tablet Take 1 tablet (500 mg total) by mouth 2 (two) times daily. 14 tablet 2  . OLMESARTAN MEDOXOMIL-HCTZ PO Take by mouth daily. 40-25 mg    . sitaGLIPtin-metformin (JANUMET) 50-1000 MG tablet Take 1 tablet by mouth 2 (two) times daily with a meal. (Patient taking differently: Take 1 tablet daily by mouth. ) 180 tablet 1   No current facility-administered medications on file prior to visit.      Past Medical History:  Diagnosis Date  . Anemia   . CHF (congestive heart failure) ([place])   . Diabetes mellitus   . Heart murmur   . Hypertension   . Pneumonia    No Known Allergies  Social History   Socioeconomic History  . Marital status: Married    Spouse name: Not on file  . Number of children: Not on file  . Years of education: Not on file  . Highest education level: Not on file  Occupational History  . Occupation: unemployed  Social Needs  . Financial resource strain: Not on file  . Food insecurity:    Worry: Not on file    Inability: Not on file  . Transportation needs:    Medical: Not on file    Non-medical: Not on file  Tobacco Use  . Smoking status: Never Smoker  . Smokeless tobacco: Never Used  Substance and Sexual Activity  . Alcohol use: Yes    Comment: occasional  . Drug use: No  . Sexual activity: Yes    Birth control/protection: None  Lifestyle  . Physical activity:    Days per week: Not on file    Minutes per session: Not on file  . Stress: Not on file  Relationships  . Social connections:    Talks on phone: Not on file    Gets together: Not on file    Attends religious service: Not on file    Active member of club or organization: Not on file    Attends meetings of clubs or organizations: Not on file    Relationship status: Not on file  Other Topics Concern  . Not on file  Social History Narrative   Lives with her 4 children.    Vitals:   01/19/18 1058 01/19/18 1154  BP: 130/82 (!) 165/90  Pulse: (!) 104    Resp: 16   Temp: 98.1 F (36.7 C)   SpO2: 99%    Body mass index is 37.08 kg/m.   Physical Exam  Nursing note and vitals reviewed. Constitutional: She is oriented to person, place, and time. She appears well-developed. She does not appear ill. No distress.  HENT:  Head: Atraumatic.  Right Ear: External ear and ear canal normal. Tympanic membrane is not erythematous and not bulging. A middle ear effusion (Mild.) is present.  Left Ear: Tympanic membrane, external ear and ear canal normal.  Nose: Rhinorrhea present. Right sinus exhibits no maxillary sinus tenderness and no frontal sinus tenderness. Left sinus exhibits no maxillary sinus tenderness and no frontal sinus tenderness.  Mouth/Throat: Oropharynx is clear and moist and mucous membranes are normal.  Post nasal drainage. Hypertrophic turbinates.  Eyes: Conjunctivae are normal.  Cardiovascular: Regular rhythm. Tachycardia present.  No murmur heard. Pulses:      Dorsalis pedis pulses are 2+ on the right side, and 2+ on the left side.  Respiratory: Effort normal and breath sounds normal. No stridor. No respiratory distress.  GI: She exhibits no mass. There is no tenderness.  Musculoskeletal: She exhibits no edema.       Thoracic back: She exhibits tenderness. She exhibits no bony tenderness.       Back:  Lymphadenopathy:       Head (right side): No submandibular adenopathy present.       Head (left side): No submandibular adenopathy present.    She has no cervical adenopathy.  Neurological: She is alert and oriented to person, place, and time. She has normal strength.  Skin: Skin is warm. No rash noted. No erythema.  Psychiatric: She has a normal mood and affect. Her speech is normal.  Well groomed, good eye contact.      ASSESSMENT AND PLAN:   [name] was seen today for cough.  Diagnoses and all orders for this visit:   URI, acute  Symptoms suggests a viral etiology, I explained patient that symptomatic  treatment is usually recommended in this case, I do not think abx is needed at this time. Instructed to monitor for signs of complications, including new onset of fever among some, clearly instructed about warning signs.  F/U as needed.   Cough  Lung auscultation is normal. We discussed other possible etiologies.       Explained that cough and nasal congestion can last a few days and sometimes weeks.       Because cough is interfering with his sleep, recommended Hycodan at bedtime, side effect discussed.       During the days he can try benzonatate.       Plenty of p.o. fluids and honey may also help.       Because history of DM 2 and upper back pain, CXR was ordered today.  -     HYDROcodone-homatropine (HYCODAN) 5-1.5 MG/5ML syrup; Take 5 mLs by mouth every 12 (twelve) hours as needed for up to 10 days for cough. -     DG Chest 2 View; Future -     benzonatate (TESSALON) 100 MG capsule; Take 2 capsules (200 mg total) by mouth 2 (two) times daily as needed for up to 10 days for cough (Am and noon).   Upper back pain  Most likely musculoskeletal. She can use topical OTC IcyHot or Aspercreme with lidocaine. Follow-up as needed.  HYPERTENSION, BENIGN ESSENTIAL Re-checked : 165/90. Not well controlled. Possible complications of elevated BP discussed. She will resume Amlodipine but 5 mg instead 10 mg. No changes in Benicar HCT. Monitor BP at home. Low salt diet. F/U in 6 weeks.      Unknown Flannigan G. Martinique, MD  [facility]. [place] office.

## 2018-01-19 NOTE — Patient Instructions (Signed)
A few things to remember from today's visit:   Hypertensive heart disease with heart failure ([place]) - Plan: amLODipine (NORVASC) 5 MG tablet  URI, acute  Cough - Plan: HYDROcodone-homatropine (HYCODAN) 5-1.5 MG/5ML syrup, DG Chest 2 View, benzonatate (TESSALON) 100 MG capsule  Start amlodipine 5 mg daily. Continue Benicar HCT. Monitor blood pressure at home.    I think symptoms today are caused by a viral illness.  viral infections are self-limited and we treat each symptom depending of severity.  Over the counter medications as decongestants and cold medications usually help, they need to be taken with caution if there is a history of high blood pressure or palpitations. Tylenol and/or Ibuprofen also helps with most symptoms (headache, muscle aching, fever,etc) Plenty of fluids. Honey helps with cough. Steam inhalations helps with runny nose, nasal congestion, and may prevent sinus infections. Cough and nasal congestion could last a few days and sometimes weeks. Please follow in not any better in 1-2 weeks or if symptoms get worse.

## 2018-01-19 NOTE — Assessment & Plan Note (Addendum)
Re-checked : 165/90. Not well controlled. Possible complications of elevated BP discussed. She will resume Amlodipine but 5 mg instead 10 mg. No changes in Benicar HCT. Monitor BP at home. Low salt diet. F/U in 6 weeks.

## 2018-02-20 ENCOUNTER — Telehealth: Payer: Self-pay | Admitting: Family Medicine

## 2018-02-20 NOTE — Telephone Encounter (Signed)
I saw her on 01/19/2018 for acute visit, URI.  During visit she mentioned that she was following with a different provider and her antihypertensive medications were adjusted. She told me amlodipine 10 mg was discontinued Benicar HCTZ was continued. BP was elevated, so I recommended resuming amlodipine but at 5 mg, no changes in Benicar HCTZ, and recommended following in 6 weeks.   Thanks, BJ

## 2018-02-20 NOTE — Telephone Encounter (Signed)
Copied from [place] [phone]. Topic: Quick Communication - Rx Refill/Question >> Feb 20, 2018 12:21 PM Margot Ables wrote: Medication: OLMESARTAN-hctz  combo med - pt states Dr. Martinique ordered amlodipine as a separate med - pt has been out for 2 days - I do not see that Dr. Martinique has ordered olmesartan-hctz in pt history Pt used to get benicar containing all 3 meds in one and can go back to that if needed and would stop amlodipine - pt asked that she be called to let her know what is sent in  Has the patient contacted their pharmacy? Yes - no refills - pt states a different doctor ordered the olmesartan-hctz last time Preferred Pharmacy (with phone number or street name): [facility] [place], [address] [place] [phone] (Phone) [phone] (Fax)

## 2018-02-20 NOTE — Telephone Encounter (Signed)
Left message to give clinic a call back concerning medication. 

## 2018-02-20 NOTE — Telephone Encounter (Signed)
Message sent to Dr. Jordan for review and approval. 

## 2018-02-23 ENCOUNTER — Other Ambulatory Visit: Payer: Self-pay | Admitting: *Deleted

## 2018-02-23 MED ORDER — OLMESARTAN MEDOXOMIL-HCTZ 40-25 MG PO TABS: 1.0000 | ORAL_TABLET | Freq: Every day | ORAL | 3 refills | Status: DC

## 2018-02-23 NOTE — Telephone Encounter (Signed)
Pt is returning call. Advised that Noelle Penner will call her back.

## 2018-02-23 NOTE — Telephone Encounter (Signed)
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

## 2018-02-23 NOTE — Telephone Encounter (Signed)
Pt returned your call. Please call back.

## 2018-02-23 NOTE — Telephone Encounter (Signed)
Rx for Benicar sent to pharmacy as requested.

## 2018-02-23 NOTE — Telephone Encounter (Signed)
Left message to give clinic a call concerning medications.

## 2018-05-10 ENCOUNTER — Other Ambulatory Visit: Payer: Self-pay | Admitting: Family Medicine

## 2018-05-10 DIAGNOSIS — I110 Hypertensive heart disease with heart failure: Secondary | ICD-10-CM

## 2018-05-20 ENCOUNTER — Ambulatory Visit: Payer: BC Managed Care – PPO | Admitting: Family Medicine

## 2018-05-25 ENCOUNTER — Other Ambulatory Visit: Payer: Self-pay | Admitting: Family Medicine

## 2018-05-25 DIAGNOSIS — I110 Hypertensive heart disease with heart failure: Secondary | ICD-10-CM

## 2018-05-25 DIAGNOSIS — E1165 Type 2 diabetes mellitus with hyperglycemia: Secondary | ICD-10-CM

## 2018-05-25 NOTE — Telephone Encounter (Signed)
Requested medication (s) are due for refill today: Yes  Requested medication (s) are on the active medication list: Yes  Last refill:  2017 and 2018  Future visit scheduled: Yes  Notes to clinic:  Expired Rx, last refilled by other providers, requesting enough until appointment.     Requested Prescriptions  Pending Prescriptions Disp Refills   sitaGLIPtin-metformin (JANUMET) 50-1000 MG tablet 180 tablet 1    Sig: Take 1 tablet by mouth 2 (two) times daily with a meal.     Endocrinology:  Diabetes - Biguanide + DPP-4 Inhibitor Combos Failed - 05/25/2018  5:00 PM      Failed - HBA1C is between 0 and 7.9 and within 180 days    Hgb A1c MFr Bld  Date Value Ref Range Status  08/13/2016 8.9 (H) 4.6 - 6.5 % Final    Comment:    Glycemic Control Guidelines for People with Diabetes:Non Diabetic:  <6%Goal of Therapy: <7%Additional Action Suggested:  >8%          Failed - Cr in normal range and within 360 days    Creat  Date Value Ref Range Status  03/12/2016 0.78 0.50 - 1.10 mg/dL Final   Creatinine, Ser  Date Value Ref Range Status  03/31/2017 0.99 0.57 - 1.00 mg/dL Final         Failed - eGFR in normal range and within 360 days    GFR, Est African American  Date Value Ref Range Status  11/03/2014 >89 mL/min Final   GFR calc Af Amer  Date Value Ref Range Status  03/31/2017 77 >59 mL/min/1.73 Final   GFR, Est Non African American  Date Value Ref Range Status  11/03/2014 >89 mL/min Final    Comment:      The estimated GFR is a calculation valid for adults (>=10 years old) that uses the CKD-EPI algorithm to adjust for age and sex. It is   not to be used for children, pregnant women, hospitalized patients,    patients on dialysis, or with rapidly changing kidney function. According to the NKDEP, eGFR >89 is normal, 60-89 shows mild impairment, 30-59 shows moderate impairment, 15-29 shows severe impairment and <15 is ESRD.      GFR calc non Af Amer  Date Value Ref Range  Status  03/31/2017 67 >59 mL/min/1.73 Final   GFR  Date Value Ref Range Status  08/13/2016 103.55 >60.00 mL/min Final         Failed - Valid encounter within last 6 months    Recent Outpatient Visits          4 months ago Hypertensive heart disease with heart failure ([place])   [place] [facility] [place], Malka So, MD   1 year ago Type 2 diabetes mellitus with hyperglycemia, without long-term current use of insulin ([place])   [place] [facility], Malka So, MD   2 years ago Type 2 diabetes mellitus without complication, without long-term current use of insulin ([place])   [place] [facility], Malka So, MD      Future Appointments            In 1 month [place], Malka So, MD [place] at [place], [facility]          ferrous sulfate 325 (65 FE) MG tablet 180 tablet 1    Sig: Take 1 tablet (325 mg total) by mouth 2 (two) times daily with a meal.     Endocrinology:  Minerals - Iron Supplementation Failed -  05/25/2018  5:00 PM      Failed - HGB in normal range and within 360 days    Hemoglobin  Date Value Ref Range Status  04/23/2017 9.6 (L) 11.1 - 15.9 g/dL Final         Failed - HCT in normal range and within 360 days    Hematocrit  Date Value Ref Range Status  04/23/2017 32.1 (L) 34.0 - 46.6 % Final         Failed - RBC in normal range and within 360 days    RBC  Date Value Ref Range Status  04/23/2017 4.44 3.77 - 5.28 x10E6/uL Final  03/13/2016 4.96 3.87 - 5.11 MIL/uL Final         Failed - Fe (serum) in normal range and within 360 days    Iron  Date Value Ref Range Status  03/12/2016 18 (L) 40 - 190 ug/dL Final   %SAT  Date Value Ref Range Status  11/03/2014 9 (L) 20 - 55 % Final         Failed - Ferritin in normal range and within 360 days    Ferritin  Date Value Ref Range Status  11/03/2014 17 10 - 291 ng/mL Final         Failed - Valid encounter within last 12 months    Recent Outpatient Visits          4  months ago Hypertensive heart disease with heart failure ([place])   [facility] at [facility] [place], Malka So, MD   1 year ago Type 2 diabetes mellitus with hyperglycemia, without long-term current use of insulin ([place])   [facility] [place], Malka So, MD   2 years ago Type 2 diabetes mellitus without complication, without long-term current use of insulin ([place])   [facility] [place], Malka So, MD      Future Appointments            In 1 month [place], Malka So, MD [place] at [place], [place] Prescriptions Disp Refills   amLODipine (NORVASC) 5 MG tablet 90 tablet 0     Cardiovascular:  Calcium Channel Blockers Failed - 05/25/2018  5:00 PM      Failed - Last BP in normal range    BP Readings from Last 1 Encounters:  01/19/18 (!) 165/90         Failed - Valid encounter within last 6 months    Recent Outpatient Visits          4 months ago Hypertensive heart disease with heart failure ([place])   [place] [facility], Malka So, MD   1 year ago Type 2 diabetes mellitus with hyperglycemia, without long-term current use of insulin ([place])   [place] at [facility], Malka So, MD   2 years ago Type 2 diabetes mellitus without complication, without long-term current use of insulin ([place])   [place] [facility], Malka So, MD      Future Appointments            In 1 month [place], Malka So, MD [employer] at [place], [facility]

## 2018-05-25 NOTE — Telephone Encounter (Signed)
Copied from [place] [id number]. Topic: Quick Communication - Rx Refill/Question >> May 25, 2018  3:23 PM Margot Ables wrote: Medication: ferrous sulfate 325 (65 FE) MG tablet & sitaGLIPtin-metformin (JANUMET) 50-1000 MG tablet - pt has been out for a month - she said she called last week and was told to call her pharmacy which she did. They state they faxed the office and did not get a response.   Has the patient contacted their pharmacy? yes Preferred Pharmacy (with phone number or street name): [id number] - [place], [place] [phone] (Phone) [phone] (Fax)

## 2018-05-25 NOTE — Telephone Encounter (Addendum)
[facility] called and spoke to [place], Technician about the Amlodipine prescription sent on 05/11/18 #90/0 refill. She says the patient picked up on 05/22/18 30 tabs due to them being out of stock. She says the patient will be able to receive the remaining on 06/22/18 for insurance to pay for it.   Patient called and advised of the above, she says she doesn't need Amlodipine, she needs Ferrous Sulfate and Janumet, which she's been out of for a month. She says it was last refilled by another provider who is no longer practicing, so she came back to Dr. Martinique. I advised she will need an appointment, she says that February is the earliest she can come around the 19th -20th. Appointment scheduled for Wednesday, 07/01/18 at 1545 with Dr. Martinique. She asks for refills until her appointment, I advised I will send to Dr. Martinique.

## 2018-05-26 MED ORDER — FERROUS SULFATE 325 (65 FE) MG PO TABS: 325.0000 mg | ORAL_TABLET | Freq: Two times a day (BID) | ORAL | 0 refills | Status: DC

## 2018-05-26 MED ORDER — SITAGLIPTIN PHOS-METFORMIN HCL 50-1000 MG PO TABS: 1.0000 | ORAL_TABLET | Freq: Two times a day (BID) | ORAL | 0 refills | Status: DC

## 2018-07-01 ENCOUNTER — Encounter: Payer: Self-pay | Admitting: Family Medicine

## 2018-07-01 ENCOUNTER — Ambulatory Visit: Payer: BC Managed Care – PPO | Admitting: Family Medicine

## 2018-07-01 ENCOUNTER — Encounter: Payer: Self-pay | Admitting: *Deleted

## 2018-07-01 VITALS — BP 130/70 | HR 98 | Temp 98.4°F | Resp 12 | Ht 64.0 in | Wt 214.4 lb

## 2018-07-01 DIAGNOSIS — D500 Iron deficiency anemia secondary to blood loss (chronic): Secondary | ICD-10-CM

## 2018-07-01 DIAGNOSIS — R519 Headache, unspecified: Secondary | ICD-10-CM

## 2018-07-01 DIAGNOSIS — D259 Leiomyoma of uterus, unspecified: Secondary | ICD-10-CM

## 2018-07-01 DIAGNOSIS — E1140 Type 2 diabetes mellitus with diabetic neuropathy, unspecified: Secondary | ICD-10-CM

## 2018-07-01 DIAGNOSIS — I110 Hypertensive heart disease with heart failure: Secondary | ICD-10-CM

## 2018-07-01 DIAGNOSIS — R51 Headache: Secondary | ICD-10-CM

## 2018-07-01 MED ORDER — FREESTYLE LIBRE 14 DAY SENSOR MISC: 1.0000 | 3 refills | Status: DC

## 2018-07-01 MED ORDER — FREESTYLE LIBRE 14 DAY READER DEVI: 1.0000 | Freq: Every day | 0 refills | Status: DC

## 2018-07-01 MED ORDER — AMLODIPINE BESYLATE 5 MG PO TABS: 5.0000 mg | ORAL_TABLET | Freq: Every day | ORAL | 2 refills | Status: DC

## 2018-07-01 MED ORDER — OLMESARTAN MEDOXOMIL-HCTZ 40-25 MG PO TABS: 1.0000 | ORAL_TABLET | Freq: Every day | ORAL | 3 refills | Status: DC

## 2018-07-01 NOTE — Progress Notes (Signed)
HPI:   [name] is a 53 y.o. female, who is here today with his husband for chronic disease follow-up. She has a few concerns today.  She was last seen on 01/19/18 for acute visit.   Diabetes Mellitus II:  Currently on Janumet 50-1000 mg. Checking BS's : She is not checking BS. She is tolerating medications well. She denies abdominal pain, nausea, vomiting, polydipsia, polyuria, or polyphagia. Mild feet burning sensation.   Lab Results  Component Value Date   HGBA1C 8.9 (H) 08/13/2016   Lab Results  Component Value Date   MICROALBUR 5.5 (H) 08/13/2016   She is requesting Rx for [facility] prescription.  HTN: She is on Benicar HCT 40-25 mg daily and Amlodipine 5 mg daily. Denies chest pain, dyspnea, palpitation, claudication, focal weakness, or edema.  She has noted gradual onset of blurry vision. Occipital headache for 2 days,constant. No associated focal weakness,numbness,or tingling. She thinks it is caused by elevated BP. She is not checking BP at home.   Lab Results  Component Value Date   CREATININE 0.99 03/31/2017   BUN 18 03/31/2017   NA 138 03/31/2017   K 4.3 03/31/2017   CL 99 03/31/2017   CO2 21 03/31/2017   Anemia: She is c/o fatigue. Heavy menses. Pica, craving for ice.  Hysterectomy was recommended but she was told she must have glucose better.  LMP 06/30/18.  Lab Results  Component Value Date   WBC 7.3 04/23/2017   HGB 9.6 (L) 04/23/2017   HCT 32.1 (L) 04/23/2017   MCV 72 (L) 04/23/2017   PLT 426 (H) 04/23/2017     Review of Systems  Constitutional: Positive for activity change and fatigue. Negative for appetite change and fever.  HENT: Negative for mouth sores, nosebleeds and trouble swallowing.   Eyes: Negative for redness and visual disturbance.  Respiratory: Negative for cough, shortness of breath and wheezing.   Cardiovascular: Positive for leg swelling. Negative for chest pain and palpitations.    Gastrointestinal: Negative for abdominal pain, nausea and vomiting.       Negative for changes in bowel habits.  Endocrine: Negative for polydipsia, polyphagia and polyuria.  Genitourinary: Positive for menstrual problem. Negative for decreased urine volume, difficulty urinating, dysuria and hematuria.  Musculoskeletal: Positive for arthralgias. Negative for gait problem.  Skin: Negative for rash and wound.  Neurological: Positive for dizziness and headaches. Negative for syncope, weakness and numbness.  Psychiatric/Behavioral: Negative for confusion. The patient is nervous/anxious.      Current Outpatient Medications on File Prior to Visit  Medication Sig Dispense Refill  . ferrous sulfate 325 (65 FE) MG tablet Take 1 tablet (325 mg total) by mouth 2 (two) times daily with a meal. 60 tablet 0  . sitaGLIPtin-metformin (JANUMET) 50-1000 MG tablet Take 1 tablet by mouth 2 (two) times daily with a meal. 60 tablet 0   No current facility-administered medications on file prior to visit.      Past Medical History:  Diagnosis Date  . Anemia   . CHF (congestive heart failure) ([place])   . Diabetes mellitus   . Heart murmur   . Hypertension   . Pneumonia    No Known Allergies  Family History  Problem Relation Age of Onset  . Hypertension Mother   . Hypertension Brother     Social History   Socioeconomic History  . Marital status: Married    Spouse name: Not on file  . Number of children: Not on  file  . Years of education: Not on file  . Highest education level: Not on file  Occupational History  . Occupation: unemployed  Social Needs  . Financial resource strain: Not on file  . Food insecurity:    Worry: Not on file    Inability: Not on file  . Transportation needs:    Medical: Not on file    Non-medical: Not on file  Tobacco Use  . Smoking status: Never Smoker  . Smokeless tobacco: Never Used  Substance and Sexual Activity  . Alcohol use: Yes    Comment: occasional   . Drug use: No  . Sexual activity: Yes    Birth control/protection: None  Lifestyle  . Physical activity:    Days per week: Not on file    Minutes per session: Not on file  . Stress: Not on file  Relationships  . Social connections:    Talks on phone: Not on file    Gets together: Not on file    Attends religious service: Not on file    Active member of club or organization: Not on file    Attends meetings of clubs or organizations: Not on file    Relationship status: Not on file  Other Topics Concern  . Not on file  Social History Narrative   Lives with her 4 children.    Vitals:   07/01/18 1540  BP: 130/70  Pulse: 98  Resp: 12  Temp: 98.4 F (36.9 C)  SpO2: 96%   Body mass index is 36.8 kg/m.   Physical Exam  Nursing note and vitals reviewed. Constitutional: She is oriented to person, place, and time. She appears well-developed. No distress.  HENT:  Head: Normocephalic and atraumatic.  Mouth/Throat: Oropharynx is clear and moist and mucous membranes are normal.  Eyes: Pupils are equal, round, and reactive to light. Conjunctivae and EOM are normal.  Cardiovascular: Normal rate and regular rhythm.  No murmur heard. Pulses:      Dorsalis pedis pulses are 2+ on the right side and 2+ on the left side.  Respiratory: Effort normal and breath sounds normal. No respiratory distress.  GI: Soft. She exhibits no mass. There is no hepatomegaly. There is no abdominal tenderness.  Musculoskeletal:        General: Edema (1+ pitting LE edema,bilateral.) present.  Lymphadenopathy:    She has no cervical adenopathy.  Neurological: She is alert and oriented to person, place, and time. She has normal strength. No cranial nerve deficit. Gait normal.  Skin: Skin is warm. No rash noted. No erythema. There is pallor.  Psychiatric: Her mood appears anxious.  Well groomed, good eye contact.     ASSESSMENT AND PLAN:   Ms. [name] was seen today for chronic disease  management.  Orders Placed This Encounter  Procedures  . Basic metabolic panel  . Fructosamine  . Hemoglobin A1c  . CBC  . Microalbumin / creatinine urine ratio   Lab Results  Component Value Date   WBC 8.2 07/01/2018   HGB 10.7 (L) 07/01/2018   HCT 34.1 (L) 07/01/2018   MCV 73.2 (L) 07/01/2018   PLT 315.0 07/01/2018   Lab Results  Component Value Date   MICROALBUR 11.2 (H) 07/01/2018   Lab Results  Component Value Date   HGBA1C 9.6 (H) 07/01/2018    Lab Results  Component Value Date   CREATININE 0.90 07/01/2018   BUN 17 07/01/2018   NA 139 07/01/2018   K 3.8 07/01/2018  CL 103 07/01/2018   CO2 26 07/01/2018     DM2 (diabetes mellitus, type 2) HgA1C has not been at goal. No changes in current management for now,will adjust treatment. Regular exercise and healthy diet with avoidance of added sugar food intake is an important part of treatment and recommended. Annual eye exam, periodic dental and foot care recommended. F/U in 4 months   IDA (iron deficiency anemia) No changes in current management, will follow labs done today and will give further recommendations accordingly. If Hg<7, she will need to go to the ER for blood transfusion.  Hypertensive heart disease with heart failure (HCC) Adequately controlled. No changes in current management. DASH-low salt diet recommended. Eye exam recommended annually.    Leiomyoma of uterus, unspecified Symptomatic. Glucose needs to be better controlled to decrease risk of surgical complications. She may need to consider hormonal therapy. Recommend arranging f/u appt with gyn.  Occipital headache Possible causes discussed. BP re-checked 138/80. It could be related to anemia. ? Tension headache. Neurologic examination today does not suggest a serious process,so I do not think head imaging is needed today. Instructed about warning signs.   40 min face to face OV. > 50% was dedicated to discussion of Dx,  prognosis, treatment options, and some side effects of medications. She has several concerns today, very concerned about her diabetes and BP control. Strongly recommend arranging appointment with eye care provider. Possible complication of poorly controlled DM.  We discussed other treatment option for diabetes, explained that and it may be necessary to consider insulin if hemoglobin A1c is above 10. She does not want insulin because she thinks she would not be able to do it because her job is stressful. Her husband is also concerned about her fatigue and headache, he is requesting time off from work to rest.  We discussed the importance of compliance with f/u appt.  Excuse letter given to go back in 5 days.    Return in about 4 weeks (around 07/29/2018) for Headache.      Latreece Mochizuki G. Martinique, MD  [facility]. [place] office.

## 2018-07-01 NOTE — Assessment & Plan Note (Signed)
Symptomatic. Glucose needs to be better controlled to decrease risk of surgical complications. She may need to consider hormonal therapy. Recommend arranging f/u appt with gyn.

## 2018-07-01 NOTE — Patient Instructions (Signed)
A few things to remember from today's visit:   Type 2 diabetes mellitus with diabetic neuropathy, without long-term current use of insulin ([place]) - Plan: Basic metabolic panel, Fructosamine, Hemoglobin A1c, Microalbumin / creatinine urine ratio  Iron deficiency anemia due to chronic blood loss - Plan: CBC  Hypertensive heart disease with heart failure (HCC) - Plan: Basic metabolic panel  Uterine leiomyoma, unspecified location Blood pressure 138/80 No changes today. If hemoglobin is under 7, he will be instructed to go to the ER because of manual transfusion. Please arrange your eye exam.  Please be sure medication list is accurate. If a new problem present, please set up appointment sooner than planned today.

## 2018-07-01 NOTE — Assessment & Plan Note (Signed)
Adequately controlled. No changes in current management. DASH-low salt diet recommended. Eye exam recommended annually.

## 2018-07-01 NOTE — Assessment & Plan Note (Signed)
No changes in current management, will follow labs done today and will give further recommendations accordingly. If Hg<7, she will need to go to the ER for blood transfusion.

## 2018-07-01 NOTE — Assessment & Plan Note (Signed)
HgA1C has not been at goal. No changes in current management for now,will adjust treatment. Regular exercise and healthy diet with avoidance of added sugar food intake is an important part of treatment and recommended. Annual eye exam, periodic dental and foot care recommended. F/U in 4 months

## 2018-07-02 LAB — BASIC METABOLIC PANEL
BUN: 17 mg/dL (ref 6–23)
CO2: 26 mEq/L (ref 19–32)
CREATININE: 0.9 mg/dL (ref 0.40–1.20)
Calcium: 9.2 mg/dL (ref 8.4–10.5)
Chloride: 103 mEq/L (ref 96–112)
GFR: 79.56 mL/min (ref >60.00)
GLUCOSE: 153 mg/dL — AB (ref 70–99)
Potassium: 3.8 mEq/L (ref 3.5–5.1)
Sodium: 139 mEq/L (ref 135–145)

## 2018-07-02 LAB — CBC
HCT: 34.1 % — ABNORMAL LOW (ref 36.0–46.0)
HEMOGLOBIN: 10.7 g/dL — AB (ref 12.0–15.0)
MCHC: 31.2 g/dL (ref 30.0–36.0)
MCV: 73.2 fl — AB (ref 78.0–100.0)
PLATELETS: 315 10*3/uL (ref 150.0–400.0)
RBC: 4.66 Mil/uL (ref 3.87–5.11)
RDW: 18.7 % — AB (ref 11.5–15.5)
WBC: 8.2 10*3/uL (ref 4.0–10.5)

## 2018-07-02 LAB — MICROALBUMIN / CREATININE URINE RATIO
CREATININE, U: 172.5 mg/dL
Microalb Creat Ratio: 6.5 mg/g (ref 0.0–30.0)
Microalb, Ur: 11.2 mg/dL — ABNORMAL HIGH (ref 0.0–1.9)

## 2018-07-02 LAB — HEMOGLOBIN A1C: Hgb A1c MFr Bld: 9.6 % — ABNORMAL HIGH (ref 4.6–6.5)

## 2018-07-03 LAB — FRUCTOSAMINE: Fructosamine: 378 umol/L — ABNORMAL HIGH (ref 205–285)

## 2018-07-06 ENCOUNTER — Other Ambulatory Visit: Payer: Self-pay | Admitting: *Deleted

## 2018-07-06 MED ORDER — INSULIN GLARGINE 100 UNITS/ML SOLOSTAR PEN: 25.0000 [IU] | PEN_INJECTOR | Freq: Every day | SUBCUTANEOUS | 3 refills | Status: DC

## 2018-07-06 MED ORDER — PEN NEEDLES 32G X 4 MM MISC: 1.0000 | Freq: Every day | 3 refills | Status: DC

## 2018-07-07 ENCOUNTER — Telehealth: Payer: Self-pay | Admitting: *Deleted

## 2018-07-07 NOTE — Telephone Encounter (Signed)
Patient given lab results at 11:12 a.m on 07/06/2018 and verbalized understanding.  Copied from [place] [phone]. Topic: General - Other >> Jul 06, 2018  9:14 AM Rayann Heman wrote: Reason for CRM: pt called to request labs from 07/01/18

## 2018-07-10 ENCOUNTER — Other Ambulatory Visit: Payer: Self-pay | Admitting: Family Medicine

## 2018-07-10 DIAGNOSIS — E1165 Type 2 diabetes mellitus with hyperglycemia: Secondary | ICD-10-CM

## 2018-07-23 ENCOUNTER — Other Ambulatory Visit: Payer: Self-pay | Admitting: Podiatry

## 2018-07-23 ENCOUNTER — Encounter: Payer: Self-pay | Admitting: Podiatry

## 2018-07-23 ENCOUNTER — Ambulatory Visit: Payer: BC Managed Care – PPO | Admitting: Podiatry

## 2018-07-23 ENCOUNTER — Ambulatory Visit (INDEPENDENT_AMBULATORY_CARE_PROVIDER_SITE_OTHER): Payer: BC Managed Care – PPO

## 2018-07-23 ENCOUNTER — Other Ambulatory Visit: Payer: Self-pay

## 2018-07-23 VITALS — BP 153/92 | HR 79

## 2018-07-23 DIAGNOSIS — S92534A Nondisplaced fracture of distal phalanx of right lesser toe(s), initial encounter for closed fracture: Secondary | ICD-10-CM

## 2018-07-23 DIAGNOSIS — M79674 Pain in right toe(s): Secondary | ICD-10-CM

## 2018-07-23 DIAGNOSIS — B351 Tinea unguium: Secondary | ICD-10-CM

## 2018-07-23 DIAGNOSIS — E119 Type 2 diabetes mellitus without complications: Secondary | ICD-10-CM | POA: Diagnosis not present

## 2018-07-24 NOTE — Progress Notes (Signed)
Subjective:   Patient ID: [name], female   DOB: 53 y.o.   MRN: [id number]   HPI 53 year old female presents the office today for concerns of right fourth toe pain.  She states that 4 days ago she dropped a box on her toe.  She has some swelling and she has been buddy splinting her toes.  She does feel that it is getting better but she was at the area checked that she is diabetic.  She just had her A1c checked it is up to over 9.  She is also concerned about toenail fungus that she wants to have checked.  She said no recent treatment for the toenails.  No redness or drainage or any swelling to the toenail sites.  No open sores.   Review of Systems  All other systems reviewed and are negative.  Past Medical History:  Diagnosis Date  . Anemia   . CHF (congestive heart failure) ([place])   . Diabetes mellitus   . Heart murmur   . Hypertension   . Pneumonia     Past Surgical History:  Procedure Laterality Date  . CESAREAN SECTION    . TUBAL LIGATION       Current Outpatient Medications:  .  amLODipine (NORVASC) 5 MG tablet, Take 1 tablet (5 mg total) by mouth daily., Disp: 90 tablet, Rfl: 2 .  Continuous Blood Gluc Receiver (FREESTYLE LIBRE 14 DAY READER) DEVI, 1 Device by Does not apply route daily., Disp: 1 Device, Rfl: 0 .  Continuous Blood Gluc Sensor (FREESTYLE LIBRE 14 DAY SENSOR) MISC, 1 Device by Does not apply route every 14 (fourteen) days., Disp: 6 each, Rfl: 3 .  ferrous sulfate 325 (65 FE) MG tablet, Take 1 tablet (325 mg total) by mouth 2 (two) times daily with a meal., Disp: 60 tablet, Rfl: 0 .  insulin glargine (LANTUS) 100 unit/mL SOPN, Inject 0.25 mLs (25 Units total) into the skin daily., Disp: 15 mL, Rfl: 3 .  Insulin Pen Needle (PEN NEEDLES) 32G X 4 MM MISC, 1 Stick by Does not apply route at bedtime., Disp: 50 each, Rfl: 3 .  JANUMET 50-1000 MG tablet, TAKE 1 TABLET BY MOUTH TWICE DAILY WITH A MEAL, Disp: 60 tablet, Rfl: 0 .  NON FORMULARY, [facility]   ANTIFUNGAL (NAIL)- #1, Disp: , Rfl:  .  olmesartan-hydrochlorothiazide (BENICAR HCT) 40-25 MG tablet, Take 1 tablet by mouth daily. 40-25 mg, Disp: 30 tablet, Rfl: 3  No Known Allergies        Objective:  Physical Exam  General: AAO x3, NAD  Dermatological: Overall the toenails are hypertrophic, dystrophic with yellow-brown discoloration.  There is no pain in the nails there is no surrounding redness or drainage or any signs of infection.  There is mild macerated tissue on the third interspace of the left foot.  No open sores.  Vascular: Dorsalis Pedis artery and Posterior Tibial artery pedal pulses are 2/4 bilateral with immedate capillary fill time.  There is no pain with calf compression, swelling, warmth, erythema.   Neruologic: Grossly intact via light touch bilateral. Vibratory intact via tuning fork bilateral. Protective threshold with Semmes Wienstein monofilament intact to all pedal sites bilateral.   Musculoskeletal: There is tenderness on the fourth toe more on the PIPJ and distal.  Toenails intact with any hematoma.  There is no erythema or there is mild edema to the toe.  No pain to the MPJ, metatarsals or other digits.  Muscular strength 5/5 in all groups  tested bilateral.  Gait: Unassisted, Nonantalgic.       Assessment:   Right fourth toe fracture/contusion; onychomycosis; interdigital maceration  Plan:  -Treatment options discussed including all alternatives, risks, and complications -Etiology of symptoms were discussed -X-rays were obtained and reviewed with the patient.  There is a questionable radiolucency in the fourth toe transversely. -Given her pain as well as the swelling to the right foot she would like to do it immobilization in a surgical shoe was dispensed today.  We discussed buddy splinting the toe as well.  Ice and elevation.  If she starts to feel better she can start to transition into a regular shoe.  I want her to continue buddy splinting the  regular shoe as well. -We discussed treatment options for nail fungus.  After discussion she was to do topical medication.  Ordered a compound cream through [employer].  Discussed side effects, use as well as duration. -Cast on these been applied to the left third interspace.  Discussed measures to help prevent moisture. -Discussed the importance of daily foot inspection.  Return in about 6 weeks (around 09/03/2018).  Trula Slade DPM

## 2018-08-21 ENCOUNTER — Other Ambulatory Visit: Payer: Self-pay | Admitting: Family Medicine

## 2018-08-21 DIAGNOSIS — E1165 Type 2 diabetes mellitus with hyperglycemia: Secondary | ICD-10-CM

## 2018-09-03 ENCOUNTER — Ambulatory Visit: Payer: BC Managed Care – PPO | Admitting: Podiatry

## 2018-09-22 ENCOUNTER — Ambulatory Visit: Payer: BC Managed Care – PPO | Admitting: Podiatry

## 2018-11-02 ENCOUNTER — Other Ambulatory Visit: Payer: Self-pay | Admitting: Family Medicine

## 2018-12-14 ENCOUNTER — Encounter: Payer: Self-pay | Admitting: Family Medicine

## 2018-12-14 ENCOUNTER — Other Ambulatory Visit: Payer: Self-pay | Admitting: Family Medicine

## 2018-12-14 DIAGNOSIS — E1165 Type 2 diabetes mellitus with hyperglycemia: Secondary | ICD-10-CM

## 2018-12-14 NOTE — Telephone Encounter (Signed)
Apt has been scheduled for refills.  Rx filled for 30 days

## 2019-01-11 ENCOUNTER — Other Ambulatory Visit: Payer: Self-pay | Admitting: Family Medicine

## 2019-01-22 ENCOUNTER — Other Ambulatory Visit: Payer: Self-pay

## 2019-01-22 ENCOUNTER — Encounter: Payer: Self-pay | Admitting: Family Medicine

## 2019-01-22 ENCOUNTER — Ambulatory Visit (INDEPENDENT_AMBULATORY_CARE_PROVIDER_SITE_OTHER): Payer: BC Managed Care – PPO | Admitting: Family Medicine

## 2019-01-22 VITALS — BP 140/80 | HR 80 | Resp 12 | Ht 64.0 in

## 2019-01-22 DIAGNOSIS — D500 Iron deficiency anemia secondary to blood loss (chronic): Secondary | ICD-10-CM | POA: Diagnosis not present

## 2019-01-22 DIAGNOSIS — R0982 Postnasal drip: Secondary | ICD-10-CM

## 2019-01-22 DIAGNOSIS — R059 Cough, unspecified: Secondary | ICD-10-CM

## 2019-01-22 DIAGNOSIS — I110 Hypertensive heart disease with heart failure: Secondary | ICD-10-CM

## 2019-01-22 DIAGNOSIS — E1140 Type 2 diabetes mellitus with diabetic neuropathy, unspecified: Secondary | ICD-10-CM

## 2019-01-22 DIAGNOSIS — R05 Cough: Secondary | ICD-10-CM | POA: Diagnosis not present

## 2019-01-22 LAB — FERRITIN: Ferritin: 12.6 ng/mL (ref 10.0–291.0)

## 2019-01-22 LAB — COMPREHENSIVE METABOLIC PANEL
ALT: 7 U/L (ref 0–35)
AST: 12 U/L (ref 0–37)
Albumin: 4.1 g/dL (ref 3.5–5.2)
Alkaline Phosphatase: 107 U/L (ref 39–117)
BUN: 26 mg/dL — ABNORMAL HIGH (ref 6–23)
CO2: 24 mEq/L (ref 19–32)
Calcium: 10.1 mg/dL (ref 8.4–10.5)
Chloride: 101 mEq/L (ref 96–112)
Creatinine, Ser: 0.94 mg/dL (ref 0.40–1.20)
GFR: 75.5 mL/min (ref >60.00)
Glucose, Bld: 194 mg/dL — ABNORMAL HIGH (ref 70–99)
Potassium: 4.3 mEq/L (ref 3.5–5.1)
Sodium: 137 mEq/L (ref 135–145)
Total Bilirubin: 0.2 mg/dL (ref 0.2–1.2)
Total Protein: 7.6 g/dL (ref 6.0–8.3)

## 2019-01-22 LAB — CBC WITH DIFFERENTIAL/PLATELET
Basophils Absolute: 0.1 10*3/uL (ref 0.0–0.1)
Basophils Relative: 0.8 % (ref 0.0–3.0)
Eosinophils Absolute: 0.1 10*3/uL (ref 0.0–0.7)
Eosinophils Relative: 0.7 % (ref 0.0–5.0)
HCT: 34.3 % — ABNORMAL LOW (ref 36.0–46.0)
Hemoglobin: 10.9 g/dL — ABNORMAL LOW (ref 12.0–15.0)
Lymphocytes Relative: 24.9 % (ref 12.0–46.0)
Lymphs Abs: 1.9 10*3/uL (ref 0.7–4.0)
MCHC: 31.7 g/dL (ref 30.0–36.0)
MCV: 74.4 fl — ABNORMAL LOW (ref 78.0–100.0)
Monocytes Absolute: 0.5 10*3/uL (ref 0.1–1.0)
Monocytes Relative: 6.2 % (ref 3.0–12.0)
Neutro Abs: 5.1 10*3/uL (ref 1.4–7.7)
Neutrophils Relative %: 67.4 % (ref 43.0–77.0)
Platelets: 349 10*3/uL (ref 150.0–400.0)
RBC: 4.61 Mil/uL (ref 3.87–5.11)
RDW: 17.6 % — ABNORMAL HIGH (ref 11.5–15.5)
WBC: 7.6 10*3/uL (ref 4.0–10.5)

## 2019-01-22 LAB — HEMOGLOBIN A1C: Hgb A1c MFr Bld: 10.4 % — ABNORMAL HIGH (ref 4.6–6.5)

## 2019-01-22 MED ORDER — AMLODIPINE BESYLATE 5 MG PO TABS: 5.0000 mg | ORAL_TABLET | Freq: Every day | ORAL | 2 refills | Status: DC

## 2019-01-22 MED ORDER — OLMESARTAN MEDOXOMIL-HCTZ 40-25 MG PO TABS: 1.0000 | ORAL_TABLET | Freq: Every day | ORAL | 2 refills | Status: DC

## 2019-01-22 MED ORDER — FLUTICASONE PROPIONATE 50 MCG/ACT NA SUSP: 2.0000 | Freq: Every day | NASAL | 6 refills | Status: DC

## 2019-01-22 MED ORDER — FERROUS SULFATE 325 (65 FE) MG PO TABS: ORAL_TABLET | ORAL | 2 refills | Status: DC

## 2019-01-22 MED ORDER — JANUMET 50-1000 MG PO TABS: ORAL_TABLET | ORAL | 2 refills | Status: DC

## 2019-01-22 NOTE — Patient Instructions (Signed)
A few things to remember from today's visit:   Iron deficiency anemia due to chronic blood loss - Plan: CBC with Differential/Platelet, Ferritin  Hypertensive heart disease with heart failure ([place]) - Plan: Comprehensive metabolic panel  Type 2 diabetes mellitus with diabetic neuropathy, without long-term current use of insulin (HCC) - Plan: Comprehensive metabolic panel, Microalbumin / creatinine urine ratio, Hemoglobin A1c, Fructosamine  Cough  No changes or new medications today. I will send the insulin depending on your A1c. I would like to see you back in 4 months.  I will send Flonase nasal spray to see if this helps with postnasal drainage and therefore with cough.Please be sure medication list is accurate. If a new problem present, please set up appointment sooner than planned today.

## 2019-01-22 NOTE — Progress Notes (Signed)
Virtual Visit via Video Note   I connected with [name] on 01/22/19 by a video enabled telemedicine application and verified that I am speaking with the correct person using two identifiers.  Location patient: home Location provider:work office Persons participating in the virtual visit: patient, provider  I discussed the limitations of evaluation and management by telemedicine and the availability of in person appointments. The patient expressed understanding and agreed to proceed.   HPI: [name] is a 53 yo female with Hx of DM II,anemia,HTN, and HF (echo 04/2012 LVEF 50%) following on chronic medical problems today.  She was supposed to have visit in the office but because she reported having cough, she was not allowed to enter the building, so changed to virtual visit.  She was last seen on 07/01/2018, no new problems since then. She denies checking BS.  DM [id number] she is on Janumet 50-1000 mg twice daily. She did not receive Lantus at her pharmacy.  Denies abdominal pain, nausea,vomiting, polydipsia,polyuria, or polyphagia.  Lab Results  Component Value Date   HGBA1C 9.6 (H) 07/01/2018   Lab Results  Component Value Date   MICROALBUR 11.2 (H) 07/01/2018   Hypertension:She is not checking BP at home. Currently she is on amlodipine 5 mg daily and olmesartan-HCTZ 40-25 mg daily. She is tolerating medication well.  Denies severe/frequent headache, visual changes, chest pain, dyspnea, palpitation, claudication, focal weakness, or edema.  Negative for orthopnea and PND.  Lab Results  Component Value Date   CREATININE 0.90 07/01/2018   BUN 17 07/01/2018   NA 139 07/01/2018   K 3.8 07/01/2018   CL 103 07/01/2018   CO2 26 07/01/2018   Iron deficiency anemia, currently she is on ferrous sulfate 325 mg bid until a week ago,ran out. She has not noted nose/gum bleeding, blood in the stool, melena, gross hematuria, or easy bruising.  + Heavy menses.  Lab Results   Component Value Date   WBC 8.2 07/01/2018   HGB 10.7 (L) 07/01/2018   HCT 34.1 (L) 07/01/2018   MCV 73.2 (L) 07/01/2018   PLT 315.0 07/01/2018   Cough, this is a chronic problem. Aggravated by postnasal drainage, she frequently has to clear her throat. No associated cough or wheezing.  She denies fever, chills, unusual fatigue or body aches. Denies any contact with COVID-19 infected person.  Negative for heartburn, burping.   Today she requesting a letter, so she can continue working from home. 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  ROS: See pertinent positives and negatives per HPI.  Past Medical History:  Diagnosis Date  . Anemia   . CHF (congestive heart failure) ([place])   . Diabetes mellitus   . Heart murmur   . Hypertension   . Pneumonia     Past Surgical History:  Procedure Laterality Date  . CESAREAN SECTION    . TUBAL LIGATION      Family History  Problem Relation Age of Onset  . Hypertension Mother   . Hypertension Brother     Social History   Socioeconomic History  . Marital status: Married    Spouse name: Not on file  . Number of children: Not on file  . Years of education: Not on file  . Highest education level: Not on file  Occupational History  . Occupation: unemployed  Social Needs  . Financial resource strain: Not on file  . Food insecurity    Worry:  Not on file    Inability: Not on file  . Transportation needs    Medical: Not on file    Non-medical: Not on file  Tobacco Use  . Smoking status: Never Smoker  . Smokeless tobacco: Never Used  Substance and Sexual Activity  . Alcohol use: Yes    Comment: occasional  . Drug use: No  . Sexual activity: Yes    Birth control/protection: None  Lifestyle  . Physical activity    Days per week: Not on file    Minutes per session: Not on file  . Stress: Not on file  Relationships  . Social Herbalist on  phone: Not on file    Gets together: Not on file    Attends religious service: Not on file    Active member of club or organization: Not on file    Attends meetings of clubs or organizations: Not on file    Relationship status: Not on file  . Intimate partner violence    Fear of current or ex partner: Not on file    Emotionally abused: Not on file    Physically abused: Not on file    Forced sexual activity: Not on file  Other Topics Concern  . Not on file  Social History Narrative   Lives with her 4 children.    Current Outpatient Medications:  .  amLODipine (NORVASC) 5 MG tablet, Take 1 tablet (5 mg total) by mouth daily., Disp: 90 tablet, Rfl: 2 .  Continuous Blood Gluc Receiver (FREESTYLE LIBRE 14 DAY READER) DEVI, 1 Device by Does not apply route daily., Disp: 1 Device, Rfl: 0 .  Continuous Blood Gluc Sensor (FREESTYLE LIBRE 14 DAY SENSOR) MISC, 1 Device by Does not apply route every 14 (fourteen) days., Disp: 6 each, Rfl: 3 .  ferrous sulfate (FEROSUL) 325 (65 FE) MG tablet, TAKE 1 TABLET(325 MG) BY MOUTH TWICE DAILY WITH A MEAL, Disp: 180 tablet, Rfl: 2 .  fluticasone (FLONASE) 50 MCG/ACT nasal spray, Place 2 sprays into both nostrils daily., Disp: 16 g, Rfl: 6 .  insulin glargine (LANTUS) 100 unit/mL SOPN, Inject 0.25 mLs (25 Units total) into the skin daily., Disp: 15 mL, Rfl: 3 .  Insulin Pen Needle (PEN NEEDLES) 32G X 4 MM MISC, 1 Stick by Does not apply route at bedtime., Disp: 50 each, Rfl: 3 .  NON FORMULARY, [facility]  ANTIFUNGAL (NAIL)- #1, Disp: , Rfl:  .  olmesartan-hydrochlorothiazide (BENICAR HCT) 40-25 MG tablet, Take 1 tablet by mouth daily., Disp: 90 tablet, Rfl: 2 .  sitaGLIPtin-metformin (JANUMET) 50-1000 MG tablet, TAKE 1 TABLET BY MOUTH TWICE DAILY WITH A MEAL, Disp: 180 tablet, Rfl: 2  EXAM:  VITALS per patient if applicable:BP [id number]   Pulse 80   Resp 12   Ht 5\' 4"  (1.626 m)   BMI 36.80 kg/m   GENERAL: alert, oriented, appears well and in no  acute distress  HEENT: atraumatic, conjunctiva clear, no obvious abnormalities on inspection.  NECK: normal movements of the head and neck  LUNGS: on inspection no signs of respiratory distress, breathing rate appears normal, no obvious gross SOB, gasping or wheezing. Lung auscultation clear bilateral (exam done in her car).  CV: no obvious cyanosis. Normal rate and regular rhythm ,no murmurs.  [name]: moves all visible extremities without noticeable abnormality  PSYCH/NEURO: pleasant and cooperative, no obvious depression or anxiety, speech and thought processing grossly intact  ASSESSMENT AND PLAN:  Discussed the following assessment and plan:  Orders Placed This Encounter  Procedures  . Comprehensive metabolic panel  . Microalbumin / creatinine urine ratio  . Hemoglobin A1c  . Fructosamine  . CBC with Differential/Platelet  . Ferritin   Lab Results  Component Value Date   WBC 7.6 01/22/2019   HGB 10.9 (L) 01/22/2019   HCT 34.3 (L) 01/22/2019   MCV 74.4 (L) 01/22/2019   PLT 349.0 01/22/2019   Lab Results  Component Value Date   HGBA1C 10.4 (H) 01/22/2019   Lab Results  Component Value Date   CREATININE 0.94 01/22/2019   BUN 26 (H) 01/22/2019   NA 137 01/22/2019   K 4.3 01/22/2019   CL 101 01/22/2019   CO2 24 01/22/2019   Lab Results  Component Value Date   ALT 7 01/22/2019   AST 12 01/22/2019   ALKPHOS 107 01/22/2019   BILITOT 0.2 01/22/2019    Type 2 diabetes mellitus with diabetic neuropathy, without long-term current use of insulin ([place]) - Plan: Comprehensive metabolic panel, Microalbumin / creatinine urine ratio, Hemoglobin A1c, Fructosamine, sitaGLIPtin-metformin (JANUMET) 50-1000 MG tablet  HgA1C has not been at goal. For now she does not want to take insulin, continue Janumet 50-1000 mg twice daily. Depending of A1c, basal insulin will need to be added. We discussed possible complications of poorly controlled glucose. Regular exercise and healthy  diet with avoidance of added sugar food intake is an important part of treatment and recommended. Annual eye exam, periodic dental and foot care recommended. F/U in 4 months  Iron deficiency anemia due to chronic blood loss - Plan: CBC with Differential/Platelet, Ferritin, ferrous sulfate (FEROSUL) 325 (65 FE) MG tablet  Resume ferrous sulfate 325 mg twice daily. Further recommendation will be given according to CBC results.  Hypertensive heart disease with heart failure ([place]) - Plan: Comprehensive metabolic panel, olmesartan-hydrochlorothiazide (BENICAR HCT) 40-25 MG tablet, amLODipine (NORVASC) 5 MG tablet  Strongly recommend monitoring BP regularly. BP today slightly elevated, not a good technique, given the fact BP was checked in her car. Continue low-salt diet. We discussed possible complications of elevated BP.  Cough - Plan: fluticasone (FLONASE) 50 MCG/ACT nasal spray Possible etiology discussed, allergies and GERD to consider. Flonase nasal spray may help with postnasal drip and therefore with cough. Instructed about warning signs.  Postnasal drip - Plan: fluticasone (FLONASE) 50 MCG/ACT nasal spray Flonase intranasal spray to use daily. OTC antihistaminic may also help.  Letter for work, so she can work from home was given.   I discussed the assessment and treatment plan with the patient. She was provided an opportunity to ask questions and all were answered. She agreed with the plan and demonstrated an understanding of the instructions.    Return in about 4 months (around 05/24/2019) for DM II,HTN.    Kalyani Maeda Martinique, MD

## 2019-01-22 NOTE — Progress Notes (Signed)
LMVM for the patient to contact the office to schedule a 4 months follow with Dr. Martinique

## 2019-01-26 LAB — FRUCTOSAMINE: Fructosamine: 395 umol/L — ABNORMAL HIGH (ref 205–285)

## 2019-02-01 ENCOUNTER — Other Ambulatory Visit: Payer: Self-pay | Admitting: *Deleted

## 2019-02-01 MED ORDER — INSULIN GLARGINE 100 UNITS/ML SOLOSTAR PEN: 20.0000 [IU] | PEN_INJECTOR | Freq: Every day | SUBCUTANEOUS | 3 refills | Status: DC

## 2019-02-22 ENCOUNTER — Encounter: Payer: Self-pay | Admitting: Emergency Medicine

## 2019-02-22 ENCOUNTER — Ambulatory Visit: Payer: BC Managed Care – PPO | Admitting: Emergency Medicine

## 2019-02-22 ENCOUNTER — Other Ambulatory Visit: Payer: Self-pay

## 2019-02-22 VITALS — BP 176/90 | HR 85 | Temp 98.4°F | Resp 16 | Ht 64.0 in | Wt 223.0 lb

## 2019-02-22 DIAGNOSIS — I10 Essential (primary) hypertension: Secondary | ICD-10-CM

## 2019-02-22 DIAGNOSIS — E1140 Type 2 diabetes mellitus with diabetic neuropathy, unspecified: Secondary | ICD-10-CM | POA: Diagnosis not present

## 2019-02-22 DIAGNOSIS — R300 Dysuria: Secondary | ICD-10-CM | POA: Diagnosis not present

## 2019-02-22 DIAGNOSIS — I110 Hypertensive heart disease with heart failure: Secondary | ICD-10-CM | POA: Diagnosis not present

## 2019-02-22 DIAGNOSIS — E1159 Type 2 diabetes mellitus with other circulatory complications: Secondary | ICD-10-CM | POA: Diagnosis not present

## 2019-02-22 DIAGNOSIS — I152 Hypertension secondary to endocrine disorders: Secondary | ICD-10-CM

## 2019-02-22 DIAGNOSIS — Z23 Encounter for immunization: Secondary | ICD-10-CM | POA: Diagnosis not present

## 2019-02-22 DIAGNOSIS — Z9189 Other specified personal risk factors, not elsewhere classified: Secondary | ICD-10-CM

## 2019-02-22 LAB — POCT URINALYSIS DIP (MANUAL ENTRY)
Bilirubin, UA: NEGATIVE
Glucose, UA: NEGATIVE mg/dL
Ketones, POC UA: NEGATIVE mg/dL
Leukocytes, UA: NEGATIVE
Nitrite, UA: NEGATIVE
Protein Ur, POC: NEGATIVE mg/dL
Spec Grav, UA: 1.01 (ref 1.010–1.025)
Urobilinogen, UA: 0.2 E.U./dL
pH, UA: 5.5 (ref 5.0–8.0)

## 2019-02-22 LAB — GLUCOSE, POCT (MANUAL RESULT ENTRY): POC Glucose: 155 mg/dl — AB (ref 70–99)

## 2019-02-22 MED ORDER — CEFADROXIL 500 MG PO CAPS: 500.0000 mg | ORAL_CAPSULE | Freq: Two times a day (BID) | ORAL | 0 refills | Status: AC

## 2019-02-22 NOTE — Patient Instructions (Addendum)
If you have lab work done today you will be contacted with your lab results within the next 2 weeks.  If you have not heard from [place] then please contact us. The fastest way to get your results is to register for My Chart.   IF you received an x-ray today, you will receive an invoice from [facility]. Please contact [facility] at [phone] with questions or concerns regarding your invoice.   IF you received labwork today, you will receive an invoice from [place]. Please contact [facility] at [phone] with questions or concerns regarding your invoice.   Our billing staff will not be able to assist you with questions regarding bills from these companies.  You will be contacted with the lab results as soon as they are available. The fastest way to get your results is to activate your My Chart account. Instructions are located on the last page of this paperwork. If you have not heard from [place] regarding the results in 2 weeks, please contact this office.        If you have lab work done today you will be contacted with your lab results within the next 2 weeks.  If you have not heard from [place] then please contact us. The fastest way to get your results is to register for My Chart.   IF you received an x-ray today, you will receive an invoice from  [facility]. Please contact [facility]iology at [phone] with questions or concerns regarding your invoice.   IF you received labwork today, you will receive an invoice from [place]. Please contact [facility] at [phone] with questions or concerns regarding your invoice.   Our billing staff will not be able to assist you with questions regarding bills from these    If you have lab work done today you will be contacted with your lab results within the next 2 weeks.  If you have not heard from [place] then please contact us. The fastest way to get your results is to register for My Chart.   IF you received an  x-ray today, you will receive an invoice from [facility]. Please contact [facility] at [phone] with questions or concerns regarding your invoice.   IF you received labwork today, you will receive an invoice from [place]. Please contact [facility] at [phone] with questions or concerns regarding your invoice.   Our billing staff will not be able to assist you with questions regarding bills from these companies.  You will be contacted with the lab results as soon as they are available. The fastest way to get your results is to activate your My Chart account. Instructions are located on the last page of this paperwork. If you have not heard from [place] regarding the results in 2 weeks, please contact this office.    companies.  You will be contacted with the lab results as soon as they are available. The fastest way to get your results is to activate your My Chart account. Instructions are located on the last page of this paperwork. If you have not heard from [place] regarding the results in 2 weeks, please contact this office.     Dysuria Dysuria is pain or discomfort while urinating. The pain or discomfort may be felt in the part of your body that drains urine from the bladder (urethra) or in the surrounding tissue of the genitals. The pain may also be felt in the groin area, lower abdomen, or lower back. You may have  to urinate frequently or have the sudden feeling that you have to urinate (urgency). Dysuria can affect both men and women, but it is more common in women. Dysuria can be caused by many different things, including:  Urinary tract infection.  Kidney stones or bladder stones.  Certain sexually transmitted infections (STIs), such as chlamydia.  Dehydration.  Inflammation of the tissues of the vagina.  Use of certain medicines.  Use of certain soaps or scented products that cause irritation. Follow these instructions at home: General instructions  Watch  your condition for any changes.  Urinate often. Avoid holding urine for long periods of time.  After a bowel movement or urination, women should cleanse from front to back, using each tissue only once.  Urinate after sexual intercourse.  Keep all follow-up visits as told by your health care provider. This is important.  If you had any tests done to find the cause of dysuria, it is up to you to get your test results. Ask your health care provider, or the department that is doing the test, when your results will be ready. Eating and drinking   Drink enough fluid to keep your urine pale yellow.  Avoid caffeine, tea, and alcohol. They can irritate the bladder and make dysuria worse. In men, alcohol may irritate the prostate. Medicines  Take over-the-counter and prescription medicines only as told by your health care provider.  If you were prescribed an antibiotic medicine, take it as told by your health care provider. Do not stop taking the antibiotic even if you start to feel better. Contact a health care provider if:  You have a fever.  You develop pain in your back or sides.  You have nausea or vomiting.  You have blood in your urine.  You are not urinating as often as you usually do. Get help right away if:  Your pain is severe and not relieved with medicines.  You cannot eat or drink without vomiting.  You are confused.  You have a rapid heartbeat while at rest.  You have shaking or chills.  You feel extremely weak. Summary  Dysuria is pain or discomfort while urinating. Many different conditions can lead to dysuria.  If you have dysuria, you may have to urinate frequently or have the sudden feeling that you have to urinate (urgency).  Watch your condition for any changes. Keep all follow-up visits as told by your health care provider.  Make sure that you urinate often and drink enough fluid to keep your urine pale yellow. This information is not intended to  replace advice given to you by your health care provider. Make sure you discuss any questions you have with your health care provider. Document Released: 01/26/2004 Document Revised: 04/11/2017 Document Reviewed: 02/13/2017 Elsevier Patient Education  2020 [place] Maintenance, Female Adopting a healthy lifestyle and getting preventive care are important in promoting health and wellness. Ask your health care provider about:  The right schedule for you to have regular tests and exams.  Things you can do on your own to prevent diseases and keep yourself healthy. What should I know about diet, weight, and exercise? Eat a healthy diet   Eat a diet that includes plenty of vegetables, fruits, low-fat dairy products, and lean protein.  Do not eat a lot of foods that are high in solid fats, added sugars, or sodium. Maintain a healthy weight Body mass index (BMI) is used to identify weight problems. It estimates body fat based on  height and weight. Your health care provider can help determine your BMI and help you achieve or maintain a healthy weight. Get regular exercise Get regular exercise. This is one of the most important things you can do for your health. Most adults should:  Exercise for at least 150 minutes each week. The exercise should increase your heart rate and make you sweat (moderate-intensity exercise).  Do strengthening exercises at least twice a week. This is in addition to the moderate-intensity exercise.  Spend less time sitting. Even light physical activity can be beneficial. Watch cholesterol and blood lipids Have your blood tested for lipids and cholesterol at 52 years of age, then have this test every 5 years. Have your cholesterol levels checked more often if:  Your lipid or cholesterol levels are high.  You are older than 52 years of age.  You are at high risk for heart disease. What should I know about cancer screening? Depending on your health  history and family history, you may need to have cancer screening at various ages. This may include screening for:  Breast cancer.  Cervical cancer.  Colorectal cancer.  Skin cancer.  Lung cancer. What should I know about heart disease, diabetes, and high blood pressure? Blood pressure and heart disease  High blood pressure causes heart disease and increases the risk of stroke. This is more likely to develop in people who have high blood pressure readings, are of African descent, or are overweight.  Have your blood pressure checked: ? Every 3-5 years if you are 73-64 years of age. ? Every year if you are 90 years old or older. Diabetes Have regular diabetes screenings. This checks your fasting blood sugar level. Have the screening done:  Once every three years after age 34 if you are at a normal weight and have a low risk for diabetes.  More often and at a younger age if you are overweight or have a high risk for diabetes. What should I know about preventing infection? Hepatitis B If you have a higher risk for hepatitis B, you should be screened for this virus. Talk with your health care provider to find out if you are at risk for hepatitis B infection. Hepatitis C Testing is recommended for:  Everyone born from [birth year] through 1964.  Anyone with known risk factors for hepatitis C. Sexually transmitted infections (STIs)  Get screened for STIs, including gonorrhea and chlamydia, if: ? You are sexually active and are younger than 52 years of age. ? You are older than 52 years of age and your health care provider tells you that you are at risk for this type of infection. ? Your sexual activity has changed since you were last screened, and you are at increased risk for chlamydia or gonorrhea. Ask your health care provider if you are at risk.  Ask your health care provider about whether you are at high risk for HIV. Your health care provider may recommend a prescription medicine to  help prevent HIV infection. If you choose to take medicine to prevent HIV, you should first get tested for HIV. You should then be tested every 3 months for as long as you are taking the medicine. Pregnancy  If you are about to stop having your period (premenopausal) and you may become pregnant, seek counseling before you get pregnant.  Take 400 to 800 micrograms (mcg) of folic acid every day if you become pregnant.  Ask for birth control (contraception) if you want to prevent pregnancy. Osteoporosis  and menopause Osteoporosis is a disease in which the bones lose minerals and strength with aging. This can result in bone fractures. If you are 74 years old or older, or if you are at risk for osteoporosis and fractures, ask your health care provider if you should:  Be screened for bone loss.  Take a calcium or vitamin D supplement to lower your risk of fractures.  Be given hormone replacement therapy (HRT) to treat symptoms of menopause. Follow these instructions at home: Lifestyle  Do not use any products that contain nicotine or tobacco, such as cigarettes, e-cigarettes, and chewing tobacco. If you need help quitting, ask your health care provider.  Do not use street drugs.  Do not share needles.  Ask your health care provider for help if you need support or information about quitting drugs. Alcohol use  Do not drink alcohol if: ? Your health care provider tells you not to drink. ? You are pregnant, may be pregnant, or are planning to become pregnant.  If you drink alcohol: ? Limit how much you use to 0-1 drink a day. ? Limit intake if you are breastfeeding.  Be aware of how much alcohol is in your drink. In the [place] one drink equals one 12 oz bottle of beer (355 mL), one 5 oz glass of wine (148 mL), or one 1 oz glass of hard liquor (44 mL). General instructions  Schedule regular health, dental, and eye exams.  Stay current with your vaccines.  Tell your health care  provider if: ? You often feel depressed. ? You have ever been abused or do not feel safe at home. Summary  Adopting a healthy lifestyle and getting preventive care are important in promoting health and wellness.  Follow your health care provider's instructions about healthy diet, exercising, and getting tested or screened for diseases.  Follow your health care provider's instructions on monitoring your cholesterol and blood pressure. This information is not intended to replace advice given to you by your health care provider. Make sure you discuss any questions you have with your health care provider. Document Released: 11/12/2010 Document Revised: 04/22/2018 Document Reviewed: 04/22/2018 Elsevier Patient Education  [place].  Diabetes Mellitus and Nutrition, Adult When you have diabetes (diabetes mellitus), it is very important to have healthy eating habits because your blood sugar (glucose) levels are greatly affected by what you eat and drink. Eating healthy foods in the appropriate amounts, at about the same times every day, can help you:  Control your blood glucose.  Lower your risk of heart disease.  Improve your blood pressure.  Reach or maintain a healthy weight. Every person with diabetes is different, and each person has different needs for a meal plan. Your health care provider may recommend that you work with a diet and nutrition specialist (dietitian) to make a meal plan that is best for you. Your meal plan may vary depending on factors such as:  The calories you need.  The medicines you take.  Your weight.  Your blood glucose, blood pressure, and cholesterol levels.  Your activity level.  Other health conditions you have, such as heart or kidney disease. How do carbohydrates affect me? Carbohydrates, also called carbs, affect your blood glucose level more than any other type of food. Eating carbs naturally raises the amount of glucose in your blood. Carb  counting is a method for keeping track of how many carbs you eat. Counting carbs is important to keep your blood glucose at a  healthy level, especially if you use insulin or take certain oral diabetes medicines. It is important to know how many carbs you can safely have in each meal. This is different for every person. Your dietitian can help you calculate how many carbs you should have at each meal and for each snack. Foods that contain carbs include:  Bread, cereal, rice, pasta, and crackers.  Potatoes and corn.  Peas, beans, and lentils.  Milk and yogurt.  Fruit and juice.  Desserts, such as cakes, cookies, ice cream, and candy. How does alcohol affect me? Alcohol can cause a sudden decrease in blood glucose (hypoglycemia), especially if you use insulin or take certain oral diabetes medicines. Hypoglycemia can be a life-threatening condition. Symptoms of hypoglycemia (sleepiness, dizziness, and confusion) are similar to symptoms of having too much alcohol. If your health care provider says that alcohol is safe for you, follow these guidelines:  Limit alcohol intake to no more than 1 drink per day for nonpregnant women and 2 drinks per day for men. One drink equals 12 oz of beer, 5 oz of wine, or 1 oz of hard liquor.  Do not drink on an empty stomach.  Keep yourself hydrated with water, diet soda, or unsweetened iced tea.  Keep in mind that regular soda, juice, and other mixers may contain a lot of sugar and must be counted as carbs. What are tips for following this plan?  Reading food labels  Start by checking the serving size on the "Nutrition Facts" label of packaged foods and drinks. The amount of calories, carbs, fats, and other nutrients listed on the label is based on one serving of the item. Many items contain more than one serving per package.  Check the total grams (g) of carbs in one serving. You can calculate the number of servings of carbs in one serving by dividing  the total carbs by 15. For example, if a food has 30 g of total carbs, it would be equal to 2 servings of carbs.  Check the number of grams (g) of saturated and trans fats in one serving. Choose foods that have low or no amount of these fats.  Check the number of milligrams (mg) of salt (sodium) in one serving. Most people should limit total sodium intake to less than 2,300 mg per day.  Always check the nutrition information of foods labeled as "low-fat" or "nonfat". These foods may be higher in added sugar or refined carbs and should be avoided.  Talk to your dietitian to identify your daily goals for nutrients listed on the label. Shopping  Avoid buying canned, premade, or processed foods. These foods tend to be high in fat, sodium, and added sugar.  Shop around the outside edge of the grocery store. This includes fresh fruits and vegetables, bulk grains, fresh meats, and fresh dairy. Cooking  Use low-heat cooking methods, such as baking, instead of high-heat cooking methods like deep frying.  Cook using healthy oils, such as olive, canola, or sunflower oil.  Avoid cooking with butter, cream, or high-fat meats. Meal planning  Eat meals and snacks regularly, preferably at the same times every day. Avoid going long periods of time without eating.  Eat foods high in fiber, such as fresh fruits, vegetables, beans, and whole grains. Talk to your dietitian about how many servings of carbs you can eat at each meal.  Eat 4-6 ounces (oz) of lean protein each day, such as lean meat, chicken, fish, eggs, or tofu. One oz  of lean protein is equal to: ? 1 oz of meat, chicken, or fish. ? 1 egg. ?  cup of tofu.  Eat some foods each day that contain healthy fats, such as avocado, nuts, seeds, and fish. Lifestyle  Check your blood glucose regularly.  Exercise regularly as told by your health care provider. This may include: ? 150 minutes of moderate-intensity or vigorous-intensity exercise  each week. This could be brisk walking, biking, or water aerobics. ? Stretching and doing strength exercises, such as yoga or weightlifting, at least 2 times a week.  Take medicines as told by your health care provider.  Do not use any products that contain nicotine or tobacco, such as cigarettes and e-cigarettes. If you need help quitting, ask your health care provider.  Work with a Social worker or diabetes educator to identify strategies to manage stress and any emotional and social challenges. Questions to ask a health care provider  Do I need to meet with a diabetes educator?  Do I need to meet with a dietitian?  What number can I call if I have questions?  When are the best times to check my blood glucose? Where to find more information:  American Diabetes Association: diabetes.org  Academy of Nutrition and Dietetics: www.eatright.[employer] of Diabetes and Digestive and Kidney Diseases (NIH): [employer] Summary  A healthy meal plan will help you control your blood glucose and maintain a healthy lifestyle.  Working with a diet and nutrition specialist (dietitian) can help you make a meal plan that is best for you.  Keep in mind that carbohydrates (carbs) and alcohol have immediate effects on your blood glucose levels. It is important to count carbs and to use alcohol carefully. This information is not intended to replace advice given to you by your health care provider. Make sure you discuss any questions you have with your health care provider. Document Released: 01/24/2005 Document Revised: 04/11/2017 Document Reviewed: 06/03/2016 Elsevier Patient Education  2020 [employer].

## 2019-02-22 NOTE — Progress Notes (Signed)
Lab Results  Component Value Date   HGBA1C 10.4 (H) 01/22/2019   BP Readings from Last 3 Encounters:  02/22/19 (!) 176/90  01/22/19 140/80  07/23/18 (!) 153/92   Lab Results  Component Value Date   CHOL 155 01/25/2014   HDL 53 01/25/2014   LDLCALC 89 01/25/2014   TRIG 63 01/25/2014   CHOLHDL 2.9 01/25/2014   Lab Results  Component Value Date   CREATININE 0.94 01/22/2019   BUN 26 (H) 01/22/2019   NA 137 01/22/2019   K 4.3 01/22/2019   CL 101 01/22/2019   CO2 24 01/22/2019   [name] 53 y.o.   Chief Complaint  Patient presents with   Establish Care   Diabetes   Hypertension    HISTORY OF PRESENT ILLNESS: This is a 53 y.o. female with multiple medical problems, first visit to this office.  Has been seeing Dr. Betty Martinique regularly for the past 3 years.  Last visit on 01/22/2019, assessment as follows: Type 2 diabetes mellitus with diabetic neuropathy, without long-term current use of insulin ([place]) - Plan: Comprehensive metabolic panel, Microalbumin / creatinine urine ratio, Hemoglobin A1c, Fructosamine, sitaGLIPtin-metformin (JANUMET) 50-1000 MG tablet  HgA1C has not been at goal. For now she does not want to take insulin, continue Janumet 50-1000 mg twice daily. Depending of A1c, basal insulin will need to be added. We discussed possible complications of poorly controlled glucose. Regular exercise and healthy diet with avoidance of added sugar food intake is an important part of treatment and recommended. Annual eye exam, periodic dental and foot care recommended. F/U in 4 months  Iron deficiency anemia due to chronic blood loss - Plan: CBC with Differential/Platelet, Ferritin, ferrous sulfate (FEROSUL) 325 (65 FE) MG tablet  Resume ferrous sulfate 325 mg twice daily. Further recommendation will be given according to CBC results.  Hypertensive heart disease with heart failure ([place]) - Plan: Comprehensive metabolic panel, olmesartan-hydrochlorothiazide  (BENICAR HCT) 40-25 MG tablet, amLODipine (NORVASC) 5 MG tablet  Strongly recommend monitoring BP regularly. BP today slightly elevated, not a good technique, given the fact BP was checked in her car. Continue low-salt diet. We discussed possible complications of elevated BP.  Cough - Plan: fluticasone (FLONASE) 50 MCG/ACT nasal spray Possible etiology discussed, allergies and GERD to consider. Flonase nasal spray may help with postnasal drip and therefore with cough. Instructed about warning signs.  Postnasal drip - Plan: fluticasone (FLONASE) 50 MCG/ACT nasal spray Flonase intranasal spray to use daily. OTC antihistaminic may also help.  Letter for work, so she can work from home was given.  I discussed the assessment and treatment plan with the patient. She was provided an opportunity to ask questions and all were answered. She agreed with the plan and demonstrated an understanding of the instructions.   Return in about 4 months (around 05/24/2019) for DM II,HTN.    Betty Martinique, MD  Today she has a couple of concerns: 1.  Has some dysuria and feels she may have a UTI. 2.  Concern about possible exposure to COVID.  Asymptomatic. 3.  Despite letter for work given by Dr. Martinique and stating that she can work from home, employer states she needs to take a medical leave instead.  Requesting I take over her medical leave request process. 4.  She has several chronic medical problems such as hypertension, diabetes, and CHF that puts her at high risk of developing COVID complications.  HPI   Prior to Admission medications   Medication Sig Start  Date End Date Taking? Authorizing Provider  amLODipine (NORVASC) 5 MG tablet Take 1 tablet (5 mg total) by mouth daily. 01/22/19  Yes Martinique, Betty G, MD  ferrous sulfate (FEROSUL) 325 (65 FE) MG tablet TAKE 1 TABLET(325 MG) BY MOUTH TWICE DAILY WITH A MEAL 01/22/19  Yes Martinique, Betty G, MD  olmesartan-hydrochlorothiazide (BENICAR HCT)  40-25 MG tablet Take 1 tablet by mouth daily. 01/22/19  Yes Martinique, Betty G, MD  sitaGLIPtin-metformin (JANUMET) 50-1000 MG tablet TAKE 1 TABLET BY MOUTH TWICE DAILY WITH A MEAL 01/22/19  Yes Martinique, Betty G, MD  Continuous Blood Gluc Receiver (FREESTYLE LIBRE 14 DAY READER) DEVI 1 Device by Does not apply route daily. 07/01/18   Martinique, Betty G, MD  Continuous Blood Gluc Sensor (FREESTYLE LIBRE 14 DAY SENSOR) MISC 1 Device by Does not apply route every 14 (fourteen) days. 07/01/18   Martinique, Betty G, MD  fluticasone [facility]) 50 MCG/ACT nasal spray Place 2 sprays into both nostrils daily. Patient not taking: Reported on 02/22/2019 01/22/19   Martinique, Betty G, MD  insulin glargine (LANTUS) 100 unit/mL SOPN Inject 0.2 mLs (20 Units total) into the skin daily. Patient not taking: Reported on 02/22/2019 02/01/19   Martinique, Betty G, MD  Insulin Pen Needle (PEN NEEDLES) 32G X 4 MM MISC 1 Stick by Does not apply route at bedtime. 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 08/16/2016   Class 2 obesity with body mass index (BMI) of 37.0 to 37.9 in adult 04/22/2016   Noncompliance 03/12/2016   Excessive or frequent menstruation 02/08/2013   Leiomyoma of uterus, unspecified 02/08/2013   Hypertensive heart disease with heart failure ([place]) 05/06/2012   CHF, acute ([place]) 05/06/2012   IDA (iron deficiency anemia) 11/18/2008   DM2 (diabetes mellitus, type 2) ([place]) 11/17/2008   HYPERTENSION, BENIGN ESSENTIAL 11/17/2008    Past Medical History:  Diagnosis Date   Anemia    CHF (congestive heart failure) (HCC)    Diabetes mellitus    Heart murmur    Hypertension    Pneumonia     Past Surgical History:  Procedure Laterality Date   CESAREAN SECTION     TUBAL  LIGATION      Social History   Socioeconomic History   Marital status: Married    Spouse name: Not on file   Number of children: Not on file   Years of education: Not on file   Highest education level: Not on file  Occupational History   Occupation: unemployed  Social Designer, fashion/clothing strain: Not on file   Food insecurity    Worry: Not on file    Inability: Not on file   Transportation needs    Medical: Not on file    Non-medical: Not on file  Tobacco Use   Smoking status: Never Smoker   Smokeless tobacco: Never Used  Substance and Sexual Activity   Alcohol use: Yes    Comment: occasional   Drug use: No   Sexual activity: Yes    Birth control/protection: None  Lifestyle   Physical activity    Days per week: Not on file    Minutes per session: Not on file   Stress: Not on file  Relationships  Social Herbalist on phone: Not on file    Gets together: Not on file    Attends religious service: Not on file    Active member of club or organization: Not on file    Attends meetings of clubs or organizations: Not on file    Relationship status: Not on file   Intimate partner violence    Fear of current or ex partner: Not on file    Emotionally abused: Not on file    Physically abused: Not on file    Forced sexual activity: Not on file  Other Topics Concern   Not on file  Social History Narrative   Lives with her 4 children.    Family History  Problem Relation Age of Onset   Hypertension Mother    Hypertension Brother      Review of Systems  Constitutional: Negative.  Negative for chills and fever.  HENT: Negative.  Negative for congestion and sore throat.   Respiratory: Negative.  Negative for cough and shortness of breath.   Cardiovascular: Negative.  Negative for chest pain and palpitations.  Gastrointestinal: Negative.  Negative for abdominal pain, diarrhea, nausea and vomiting.  Genitourinary: Positive for dysuria,  frequency and urgency. Negative for flank pain and hematuria.  Musculoskeletal: Positive for back pain. Negative for myalgias.  Neurological: Negative.  Negative for dizziness and headaches.  All other systems reviewed and are negative.   Vitals:   02/22/19 1611  BP: (!) 176/90  Pulse: 85  Resp: 16  Temp: 98.4 F (36.9 C)  SpO2: 99%    Physical Exam Vitals signs reviewed.  Constitutional:      Appearance: Normal appearance.  HENT:     Head: Normocephalic.  Eyes:     Extraocular Movements: Extraocular movements intact.  Neck:     Musculoskeletal: Normal range of motion.  Cardiovascular:     Rate and Rhythm: Normal rate.  Pulmonary:     Effort: Pulmonary effort is normal.  Musculoskeletal: Normal range of motion.  Neurological:     General: No focal deficit present.     Mental Status: She is alert and oriented to person, place, and time.  Psychiatric:        Mood and Affect: Mood normal.        Behavior: Behavior normal.    Results for orders placed or performed in visit on 02/22/19 (from the past 24 hour(s))  POCT urinalysis dipstick     Status: Abnormal   Collection Time: 02/22/19  5:21 PM  Result Value Ref Range   Color, UA yellow yellow   Clarity, UA clear clear   Glucose, UA negative negative mg/dL   Bilirubin, UA negative negative   Ketones, POC UA negative negative mg/dL   Spec Grav, UA 1.010 1.010 - 1.025   Blood, UA trace-intact (A) negative   pH, UA 5.5 5.0 - 8.0   Protein Ur, POC negative negative mg/dL   Urobilinogen, UA 0.2 0.2 or 1.0 E.U./dL   Nitrite, UA Negative Negative   Leukocytes, UA Negative Negative  POCT glucose (manual entry)     Status: Abnormal   Collection Time: 02/22/19  5:21 PM  Result Value Ref Range   POC Glucose 155 (A) 70 - 99 mg/dl     ASSESSMENT & PLAN: Patient has multiple chronic medical problems I believe make her high risk for COVID complications.  Therefore a medical leave from work is probably warranted.  I will not  be handling her medical  leave process/paperwork as I believe it is in her best interest to have her primary care physician's office handle this request.  I believe it is both reasonable and the right thing to do.  Patient understands.   Wilmer was seen today for establish care, diabetes and hypertension.  Diagnoses and all orders for this visit:  Dysuria Comments: Clinical infection Orders: -     POCT urinalysis dipstick -     Urine Culture -     cefadroxil (DURICEF) 500 MG capsule; Take 1 capsule (500 mg total) by mouth 2 (two) times daily for 7 days.  Type 2 diabetes mellitus with diabetic neuropathy, without long-term current use of insulin (HCC) -     POCT glucose (manual entry)  Hypertensive heart disease with heart failure ([place])  Hypertension associated with diabetes ([place])  Need for diphtheria-tetanus-pertussis (Tdap) vaccine -     Tdap vaccine greater than or equal to 7yo IM  At increased risk of exposure to COVID-19 virus -     SAR CoV2 Serology (COVID 19)AB(IGG)IA    Patient Instructions       If you have lab work done today you will be contacted with your lab results within the next 2 weeks.  If you have not heard from [place] then please contact us. The fastest way to get your results is to register for My Chart.   IF you received an x-ray today, you will receive an invoice from [facility]. Please contact [facility] at [phone] with questions or concerns regarding your invoice.   IF you received labwork today, you will receive an invoice from [place]. Please contact [facility] at [phone] with questions or concerns regarding your invoice.   Our billing staff will not be able to assist you with questions regarding bills from these companies.  You will be contacted with the lab results as soon as they are available. The fastest way to get your results is to activate your My Chart account. Instructions are located on the last page of this  paperwork. If you have not heard from [place] regarding the results in 2 weeks, please contact this office.        If you have lab work done today you will be contacted with your lab results within the next 2 weeks.  If you have not heard from [place] then please contact us. The fastest way to get your results is to register for My Chart.   IF you received an x-ray today, you will receive an invoice from [facility]. Please contact [facility] at [phone] with questions or concerns regarding your invoice.   IF you received labwork today, you will receive an invoice from [place]. Please contact [facility] at [phone] with questions or concerns regarding your invoice.   Our billing staff will not be able to assist you with questions regarding bills from these    If you have lab work done today you will be contacted with your lab results within the next 2 weeks.  If you have not heard from [place] then please contact us. The fastest way to get your results is to register for My Chart.   IF you received an x-ray today, you will receive an invoice from [facility]. Please contact [facility] at [phone] with questions or concerns regarding your invoice.   IF you received labwork today, you will receive an invoice from [place]. Please contact [facility] at [phone] with questions or concerns regarding your invoice.   Our billing staff  will not be able to assist you with questions regarding bills from these companies.  You will be contacted with the lab results as soon as they are available. The fastest way to get your results is to activate your My Chart account. Instructions are located on the last page of this paperwork. If you have not heard from [place] regarding the results in 2 weeks, please contact this office.    companies.  You will be contacted with the lab results as soon as they are available. The fastest way to get your results is to activate your  My Chart account. Instructions are located on the last page of this paperwork. If you have not heard from [place] regarding the results in 2 weeks, please contact this office.     Dysuria Dysuria is pain or discomfort while urinating. The pain or discomfort may be felt in the part of your body that drains urine from the bladder (urethra) or in the surrounding tissue of the genitals. The pain may also be felt in the groin area, lower abdomen, or lower back. You may have to urinate frequently or have the sudden feeling that you have to urinate (urgency). Dysuria can affect both men and women, but it is more common in women. Dysuria can be caused by many different things, including:  Urinary tract infection.  Kidney stones or bladder stones.  Certain sexually transmitted infections (STIs), such as chlamydia.  Dehydration.  Inflammation of the tissues of the vagina.  Use of certain medicines.  Use of certain soaps or scented products that cause irritation. Follow these instructions at home: General instructions  Watch your condition for any changes.  Urinate often. Avoid holding urine for long periods of time.  After a bowel movement or urination, women should cleanse from front to back, using each tissue only once.  Urinate after sexual intercourse.  Keep all follow-up visits as told by your health care provider. This is important.  If you had any tests done to find the cause of dysuria, it is up to you to get your test results. Ask your health care provider, or the department that is doing the test, when your results will be ready. Eating and drinking   Drink enough fluid to keep your urine pale yellow.  Avoid caffeine, tea, and alcohol. They can irritate the bladder and make dysuria worse. In men, alcohol may irritate the prostate. Medicines  Take over-the-counter and prescription medicines only as told by your health care provider.  If you were prescribed an antibiotic  medicine, take it as told by your health care provider. Do not stop taking the antibiotic even if you start to feel better. Contact a health care provider if:  You have a fever.  You develop pain in your back or sides.  You have nausea or vomiting.  You have blood in your urine.  You are not urinating as often as you usually do. Get help right away if:  Your pain is severe and not relieved with medicines.  You cannot eat or drink without vomiting.  You are confused.  You have a rapid heartbeat while at rest.  You have shaking or chills.  You feel extremely weak. Summary  Dysuria is pain or discomfort while urinating. Many different conditions can lead to dysuria.  If you have dysuria, you may have to urinate frequently or have the sudden feeling that you have to urinate (urgency).  Watch your condition for any changes. Keep all follow-up visits as  told by your health care provider.  Make sure that you urinate often and drink enough fluid to keep your urine pale yellow. This information is not intended to replace advice given to you by your health care provider. Make sure you discuss any questions you have with your health care provider. Document Released: 01/26/2004 Document Revised: 04/11/2017 Document Reviewed: 02/13/2017 Elsevier Patient Education  2020 [place] Maintenance, Female Adopting a healthy lifestyle and getting preventive care are important in promoting health and wellness. Ask your health care provider about:  The right schedule for you to have regular tests and exams.  Things you can do on your own to prevent diseases and keep yourself healthy. What should I know about diet, weight, and exercise? Eat a healthy diet   Eat a diet that includes plenty of vegetables, fruits, low-fat dairy products, and lean protein.  Do not eat a lot of foods that are high in solid fats, added sugars, or sodium. Maintain a healthy weight Body mass index  (BMI) is used to identify weight problems. It estimates body fat based on height and weight. Your health care provider can help determine your BMI and help you achieve or maintain a healthy weight. Get regular exercise Get regular exercise. This is one of the most important things you can do for your health. Most adults should:  Exercise for at least 150 minutes each week. The exercise should increase your heart rate and make you sweat (moderate-intensity exercise).  Do strengthening exercises at least twice a week. This is in addition to the moderate-intensity exercise.  Spend less time sitting. Even light physical activity can be beneficial. Watch cholesterol and blood lipids Have your blood tested for lipids and cholesterol at 52 years of age, then have this test every 5 years. Have your cholesterol levels checked more often if:  Your lipid or cholesterol levels are high.  You are older than 52 years of age.  You are at high risk for heart disease. What should I know about cancer screening? Depending on your health history and family history, you may need to have cancer screening at various ages. This may include screening for:  Breast cancer.  Cervical cancer.  Colorectal cancer.  Skin cancer.  Lung cancer. What should I know about heart disease, diabetes, and high blood pressure? Blood pressure and heart disease  High blood pressure causes heart disease and increases the risk of stroke. This is more likely to develop in people who have high blood pressure readings, are of African descent, or are overweight.  Have your blood pressure checked: ? Every 3-5 years if you are 50-25 years of age. ? Every year if you are 9 years old or older. Diabetes Have regular diabetes screenings. This checks your fasting blood sugar level. Have the screening done:  Once every three years after age 56 if you are at a normal weight and have a low risk for diabetes.  More often and at a  younger age if you are overweight or have a high risk for diabetes. What should I know about preventing infection? Hepatitis B If you have a higher risk for hepatitis B, you should be screened for this virus. Talk with your health care provider to find out if you are at risk for hepatitis B infection. Hepatitis C Testing is recommended for:  Everyone born from [birth year] through 1964.  Anyone with known risk factors for hepatitis C. Sexually transmitted infections (STIs)  Get screened for STIs, including  gonorrhea and chlamydia, if: ? You are sexually active and are younger than 52 years of age. ? You are older than 52 years of age and your health care provider tells you that you are at risk for this type of infection. ? Your sexual activity has changed since you were last screened, and you are at increased risk for chlamydia or gonorrhea. Ask your health care provider if you are at risk.  Ask your health care provider about whether you are at high risk for HIV. Your health care provider may recommend a prescription medicine to help prevent HIV infection. If you choose to take medicine to prevent HIV, you should first get tested for HIV. You should then be tested every 3 months for as long as you are taking the medicine. Pregnancy  If you are about to stop having your period (premenopausal) and you may become pregnant, seek counseling before you get pregnant.  Take 400 to 800 micrograms (mcg) of folic acid every day if you become pregnant.  Ask for birth control (contraception) if you want to prevent pregnancy. Osteoporosis and menopause Osteoporosis is a disease in which the bones lose minerals and strength with aging. This can result in bone fractures. If you are 73 years old or older, or if you are at risk for osteoporosis and fractures, ask your health care provider if you should:  Be screened for bone loss.  Take a calcium or vitamin D supplement to lower your risk of fractures.  Be  given hormone replacement therapy (HRT) to treat symptoms of menopause. Follow these instructions at home: Lifestyle  Do not use any products that contain nicotine or tobacco, such as cigarettes, e-cigarettes, and chewing tobacco. If you need help quitting, ask your health care provider.  Do not use street drugs.  Do not share needles.  Ask your health care provider for help if you need support or information about quitting drugs. Alcohol use  Do not drink alcohol if: ? Your health care provider tells you not to drink. ? You are pregnant, may be pregnant, or are planning to become pregnant.  If you drink alcohol: ? Limit how much you use to 0-1 drink a day. ? Limit intake if you are breastfeeding.  Be aware of how much alcohol is in your drink. In the [place] one drink equals one 12 oz bottle of beer (355 mL), one 5 oz glass of wine (148 mL), or one 1 oz glass of hard liquor (44 mL). General instructions  Schedule regular health, dental, and eye exams.  Stay current with your vaccines.  Tell your health care provider if: ? You often feel depressed. ? You have ever been abused or do not feel safe at home. Summary  Adopting a healthy lifestyle and getting preventive care are important in promoting health and wellness.  Follow your health care provider's instructions about healthy diet, exercising, and getting tested or screened for diseases.  Follow your health care provider's instructions on monitoring your cholesterol and blood pressure. This information is not intended to replace advice given to you by your health care provider. Make sure you discuss any questions you have with your health care provider. Document Released: 11/12/2010 Document Revised: 04/22/2018 Document Reviewed: 04/22/2018 Elsevier Patient Education  [place].  Diabetes Mellitus and Nutrition, Adult When you have diabetes (diabetes mellitus), it is very important to have healthy eating habits  because your blood sugar (glucose) levels are greatly affected by what you eat and drink.  Eating healthy foods in the appropriate amounts, at about the same times every day, can help you:  Control your blood glucose.  Lower your risk of heart disease.  Improve your blood pressure.  Reach or maintain a healthy weight. Every person with diabetes is different, and each person has different needs for a meal plan. Your health care provider may recommend that you work with a diet and nutrition specialist (dietitian) to make a meal plan that is best for you. Your meal plan may vary depending on factors such as:  The calories you need.  The medicines you take.  Your weight.  Your blood glucose, blood pressure, and cholesterol levels.  Your activity level.  Other health conditions you have, such as heart or kidney disease. How do carbohydrates affect me? Carbohydrates, also called carbs, affect your blood glucose level more than any other type of food. Eating carbs naturally raises the amount of glucose in your blood. Carb counting is a method for keeping track of how many carbs you eat. Counting carbs is important to keep your blood glucose at a healthy level, especially if you use insulin or take certain oral diabetes medicines. It is important to know how many carbs you can safely have in each meal. This is different for every person. Your dietitian can help you calculate how many carbs you should have at each meal and for each snack. Foods that contain carbs include:  Bread, cereal, rice, pasta, and crackers.  Potatoes and corn.  Peas, beans, and lentils.  Milk and yogurt.  Fruit and juice.  Desserts, such as cakes, cookies, ice cream, and candy. How does alcohol affect me? Alcohol can cause a sudden decrease in blood glucose (hypoglycemia), especially if you use insulin or take certain oral diabetes medicines. Hypoglycemia can be a life-threatening condition. Symptoms of  hypoglycemia (sleepiness, dizziness, and confusion) are similar to symptoms of having too much alcohol. If your health care provider says that alcohol is safe for you, follow these guidelines:  Limit alcohol intake to no more than 1 drink per day for nonpregnant women and 2 drinks per day for men. One drink equals 12 oz of beer, 5 oz of wine, or 1 oz of hard liquor.  Do not drink on an empty stomach.  Keep yourself hydrated with water, diet soda, or unsweetened iced tea.  Keep in mind that regular soda, juice, and other mixers may contain a lot of sugar and must be counted as carbs. What are tips for following this plan?  Reading food labels  Start by checking the serving size on the "Nutrition Facts" label of packaged foods and drinks. The amount of calories, carbs, fats, and other nutrients listed on the label is based on one serving of the item. Many items contain more than one serving per package.  Check the total grams (g) of carbs in one serving. You can calculate the number of servings of carbs in one serving by dividing the total carbs by 15. For example, if a food has 30 g of total carbs, it would be equal to 2 servings of carbs.  Check the number of grams (g) of saturated and trans fats in one serving. Choose foods that have low or no amount of these fats.  Check the number of milligrams (mg) of salt (sodium) in one serving. Most people should limit total sodium intake to less than 2,300 mg per day.  Always check the nutrition information of foods labeled as "low-fat" or "nonfat".  These foods may be higher in added sugar or refined carbs and should be avoided.  Talk to your dietitian to identify your daily goals for nutrients listed on the label. Shopping  Avoid buying canned, premade, or processed foods. These foods tend to be high in fat, sodium, and added sugar.  Shop around the outside edge of the grocery store. This includes fresh fruits and vegetables, bulk grains, fresh  meats, and fresh dairy. Cooking  Use low-heat cooking methods, such as baking, instead of high-heat cooking methods like deep frying.  Cook using healthy oils, such as olive, canola, or sunflower oil.  Avoid cooking with butter, cream, or high-fat meats. Meal planning  Eat meals and snacks regularly, preferably at the same times every day. Avoid going long periods of time without eating.  Eat foods high in fiber, such as fresh fruits, vegetables, beans, and whole grains. Talk to your dietitian about how many servings of carbs you can eat at each meal.  Eat 4-6 ounces (oz) of lean protein each day, such as lean meat, chicken, fish, eggs, or tofu. One oz of lean protein is equal to: ? 1 oz of meat, chicken, or fish. ? 1 egg. ?  cup of tofu.  Eat some foods each day that contain healthy fats, such as avocado, nuts, seeds, and fish. Lifestyle  Check your blood glucose regularly.  Exercise regularly as told by your health care provider. This may include: ? 150 minutes of moderate-intensity or vigorous-intensity exercise each week. This could be brisk walking, biking, or water aerobics. ? Stretching and doing strength exercises, such as yoga or weightlifting, at least 2 times a week.  Take medicines as told by your health care provider.  Do not use any products that contain nicotine or tobacco, such as cigarettes and e-cigarettes. If you need help quitting, ask your health care provider.  Work with a Social worker or diabetes educator to identify strategies to manage stress and any emotional and social challenges. Questions to ask a health care provider  Do I need to meet with a diabetes educator?  Do I need to meet with a dietitian?  What number can I call if I have questions?  When are the best times to check my blood glucose? Where to find more information:  American Diabetes Association: diabetes.org  Academy of Nutrition and Dietetics: www.eatright.[employer]  of Diabetes and Digestive and Kidney Diseases (NIH): [employer] Summary  A healthy meal plan will help you control your blood glucose and maintain a healthy lifestyle.  Working with a diet and nutrition specialist (dietitian) can help you make a meal plan that is best for you.  Keep in mind that carbohydrates (carbs) and alcohol have immediate effects on your blood glucose levels. It is important to count carbs and to use alcohol carefully. This information is not intended to replace advice given to you by your health care provider. Make sure you discuss any questions you have with your health care provider. Document Released: 01/24/2005 Document Revised: 04/11/2017 Document Reviewed: 06/03/2016 Elsevier Patient Education  2020 Elsevier Inc.     Agustina Caroli, MD [facility]

## 2019-02-23 ENCOUNTER — Encounter: Payer: Self-pay | Admitting: Family Medicine

## 2019-02-23 ENCOUNTER — Telehealth: Payer: Self-pay | Admitting: Family Medicine

## 2019-02-23 ENCOUNTER — Telehealth (INDEPENDENT_AMBULATORY_CARE_PROVIDER_SITE_OTHER): Payer: BC Managed Care – PPO | Admitting: Family Medicine

## 2019-02-23 VITALS — Ht 64.0 in

## 2019-02-23 DIAGNOSIS — Z6838 Body mass index (BMI) 38.0-38.9, adult: Secondary | ICD-10-CM

## 2019-02-23 DIAGNOSIS — E1140 Type 2 diabetes mellitus with diabetic neuropathy, unspecified: Secondary | ICD-10-CM

## 2019-02-23 DIAGNOSIS — F419 Anxiety disorder, unspecified: Secondary | ICD-10-CM

## 2019-02-23 LAB — EUROIMMUN SARS-COV-2 AB, IGG: Euroimmun SARS-CoV-2 Ab, IgG: NEGATIVE

## 2019-02-23 LAB — SAR COV2 SEROLOGY (COVID19)AB(IGG),IA

## 2019-02-23 NOTE — Telephone Encounter (Signed)
Patient called office and stated that she did not get link, patient informed via [place] that Dr. Martinique sent several links to phone and email. Patient tried to connect at 4 pm. Per Dr. Martinique she would send link again at 4:30 pm after her 4:15 pm patient. Message relayed to [facility], who informed patient.

## 2019-02-23 NOTE — Telephone Encounter (Signed)
Patient called office and stated that she did not get link, patient informed via [place] that Dr. Martinique sent several links to phone and email. Patient tried to connect at 4 pm. Per Dr. Martinique she would send link again at 4:30 pm after her 4:15 pm patient. Message relayed to [facility], who informed patient.

## 2019-02-23 NOTE — Telephone Encounter (Signed)
Patient did not show for appointment, Dr. Martinique sent link several times to email address and phone.

## 2019-02-23 NOTE — Telephone Encounter (Signed)
Patient did not show for appointment, Dr. Martinique sent link several times to email address and phone

## 2019-02-23 NOTE — Telephone Encounter (Signed)
Noted. Forms given to provider for completion.

## 2019-02-23 NOTE — Telephone Encounter (Signed)
Patient is requesting a call back about forms that were dropped off today.  She wanted Dr Martinique to call her but I advised her that it would be Dr Doug Sou CMA returning the call.

## 2019-02-23 NOTE — Telephone Encounter (Signed)
Dr. Martinique and patient connected at 4:35 pm to discuss need for [facility] paperwork. Patient informed by PCP that form cannot be completed at this time. Patient informed that letter can be provided instead at the request of the patient. Patient aware that employer may not accept letter. Letter provided for patient requesting time off per patient request.

## 2019-02-23 NOTE — Progress Notes (Signed)
Virtual Visit via Video Note   I connected with [name] on 02/23/19 by a video enabled telemedicine application and verified that I am speaking with the correct person using two identifiers.  Location patient: home Location provider:work office Persons participating in the virtual visit: patient, provider  I discussed the limitations of evaluation and management by telemedicine and the availability of in person appointments. The patient expressed understanding and agreed to proceed.   HPI: [name] is a 53 yo female with Hx of DM II,HTN,and CHF who is requesting a month of FMLA. 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 For the past month she has been working from home but she was told she needs to go back to school to start classes face to face. She wears mask,gloves,and facial shift but she does not feel like employer is disinfecting surfaces appropriately.   DM II has been poorly controlled. She is on Janumet 50-1000 mg bid. Lantus was recommended last follow up but she is not taking it  She is not exercising regularly nor following a healthful diet.  Lab Results  Component Value Date   HGBA1C 10.4 (H) 01/22/2019   She is not checking BP regularly. She is on Amlodipine 5 mg and olmesartan-HCTZ 40-25 mg daily.  Her husband is present during visit as well as another family member shouting in the background.  Her husband doe snot understand why she cannot have a month FMLA given the fact she has comorbidity conditions that will increase the risk of complications if in fact she gets COVID 19 infection.  ROS: See pertinent positives and negatives per HPI.  Past Medical History:  Diagnosis Date  . Anemia   . CHF (congestive heart failure) ([place])   . Diabetes mellitus   . Heart murmur   . Hypertension   . Pneumonia     Past Surgical History:  Procedure Laterality Date  . CESAREAN SECTION    . TUBAL LIGATION      Family History   Problem Relation Age of Onset  . Hypertension Mother   . Hypertension Brother     Social History   Socioeconomic History  . Marital status: Married    Spouse name: Not on file  . Number of children: Not on file  . Years of education: Not on file  . Highest education level: Not on file  Occupational History  . Occupation: unemployed  Social Needs  . Financial resource strain: Not on file  . Food insecurity    Worry: Not on file    Inability: Not on file  . Transportation needs    Medical: Not on file    Non-medical: Not on file  Tobacco Use  . Smoking status: Never Smoker  . Smokeless tobacco: Never Used  Substance and Sexual Activity  . Alcohol use: Yes    Comment: occasional  . Drug use: No  . Sexual activity: Yes    Birth control/protection: None  Lifestyle  . Physical activity    Days per week: Not on file    Minutes per session: Not on file  . Stress: Not on file  Relationships  . Social Herbalist on phone: Not on file    Gets together: Not on file    Attends religious service: Not on file    Active member of club or organization: Not on file    Attends meetings of clubs or organizations: Not on file  Relationship status: Not on file  . Intimate partner violence    Fear of current or ex partner: Not on file    Emotionally abused: Not on file    Physically abused: Not on file    Forced sexual activity: Not on file  Other Topics Concern  . Not on file  Social History Narrative   Lives with her 4 children.    Current Outpatient Medications:  .  amLODipine (NORVASC) 5 MG tablet, Take 1 tablet (5 mg total) by mouth daily., Disp: 90 tablet, Rfl: 2 .  cefadroxil (DURICEF) 500 MG capsule, Take 1 capsule (500 mg total) by mouth 2 (two) times daily for 7 days., Disp: 14 capsule, Rfl: 0 .  Continuous Blood Gluc Receiver (FREESTYLE LIBRE 14 DAY READER) DEVI, 1 Device by Does not apply route daily., Disp: 1 Device, Rfl: 0 .  Continuous Blood Gluc  Sensor (FREESTYLE LIBRE 14 DAY SENSOR) MISC, 1 Device by Does not apply route every 14 (fourteen) days., Disp: 6 each, Rfl: 3 .  ferrous sulfate (FEROSUL) 325 (65 FE) MG tablet, TAKE 1 TABLET(325 MG) BY MOUTH TWICE DAILY WITH A MEAL, Disp: 180 tablet, Rfl: 2 .  fluticasone (FLONASE) 50 MCG/ACT nasal spray, Place 2 sprays into both nostrils daily. (Patient not taking: Reported on 02/22/2019), Disp: 16 g, Rfl: 6 .  insulin glargine (LANTUS) 100 unit/mL SOPN, Inject 0.2 mLs (20 Units total) into the skin daily. (Patient not taking: Reported on 02/22/2019), Disp: 15 mL, Rfl: 3 .  Insulin Pen Needle (PEN NEEDLES) 32G X 4 MM MISC, 1 Stick by Does not apply route at bedtime. (Patient not taking: Reported on 02/22/2019), Disp: 50 each, Rfl: 3 .  NON FORMULARY, [facility]  ANTIFUNGAL (NAIL)- #1, Disp: , Rfl:  .  olmesartan-hydrochlorothiazide (BENICAR HCT) 40-25 MG tablet, Take 1 tablet by mouth daily., Disp: 90 tablet, Rfl: 2 .  sitaGLIPtin-metformin (JANUMET) 50-1000 MG tablet, TAKE 1 TABLET BY MOUTH TWICE DAILY WITH A MEAL, Disp: 180 tablet, Rfl: 2  EXAM:  VITALS per patient if applicable:Ht 5\' 4"  (1.626 m)   BMI 38.28 kg/m   GENERAL: alert, oriented, appears well and in no acute distress  HEENT: atraumatic, conjunctiva clear, no obvious abnormalities on inspection.  NECK: normal movements of the head and neck  LUNGS: on inspection no signs of respiratory distress, breathing rate appears normal, no obvious gross SOB, gasping or wheezing  CV: no obvious cyanosis  [name]: moves all visible extremities without noticeable abnormality  PSYCH/NEURO: pleasant and cooperative, no obvious depression, + anxious.  ASSESSMENT AND PLAN:  Discussed the following assessment and plan:  Type 2 diabetes mellitus with diabetic neuropathy, without long-term current use of insulin (HCC)  Poorly controlled. Non compliant with treatment recommendations. She promised that she will start taking meds as she  is supposed to and to follow a healthier life style.  Anxiety disorder, unspecified type Associated with possible exposure with COVID 19. Recommend following CDC recommendation for prevention. She also needs to voice her concerns about disinfection protocols with employer. She doe snot want me to mention this on letter.  Class 2 severe obesity due to excess calories with serious comorbidity and body mass index (BMI) of 38.0 to 38.9 in adult [facility]) We discussed benefits of wt loss as well as adverse effects of obesity. Consistency with healthy diet and physical activity recommended.  25 min face to face OV. > 50% was dedicated to discussion of Dx, prognosis, and possible complications from poorly controlled health problems.  In length we discussed legal purpose of FMLA , I do not think her request is appropriate.  Certainly she is a high risk for complications if she becomes infected with COVID 19, explained that nothing will changed significantly in a month from now in regard to risk of exposure.  Her husband asks if she can have at least a few days off to "get prepare."    Finally I agreed with providing letter for her employer asking for time she is requesting, 10 days.   I discussed the assessment and treatment plan with the patient. She was provided an opportunity to ask questions and all were answered. She agreed with the plan and demonstrated an understanding of the instructions.   Return in about 3 months (around 05/26/2019) for DM II,HTN,Obesity,HLD.    [name] Sumler Martinique, MD

## 2019-02-23 NOTE — Telephone Encounter (Signed)
FMLA forms to be filled out;gave forms to [place] for patient's virtual appt.  Call [phone] or [phone] upon completion.

## 2019-02-23 NOTE — Telephone Encounter (Signed)
Patient decided to make an appointment to see Dr Martinique.

## 2019-02-23 NOTE — Telephone Encounter (Signed)
Forms given to provider for completion.

## 2019-02-23 NOTE — Telephone Encounter (Signed)
Dr. Martinique and patient connected at 4:35 pm to discuss need for [facility] paperwork. Patient informed by PCP that form cannot be completed at this time. Patient informed that letter can be provided instead at the request of the patient. Patient aware that employer may not accept letter. Letter provided for patient requesting time off per patient request.

## 2019-02-24 LAB — URINE CULTURE

## 2019-05-05 ENCOUNTER — Telehealth: Payer: Self-pay | Admitting: *Deleted

## 2019-05-05 NOTE — Telephone Encounter (Signed)
I can not longer extent time unless employer states that it is ok for her to work from home. Thanks, BJ

## 2019-05-05 NOTE — Telephone Encounter (Signed)
Message Routed to PCP CMA 

## 2019-05-05 NOTE — Telephone Encounter (Signed)
Copied from [place] [id number]. Topic: General - Other >> May 05, 2019  3:27 PM [name] wrote: Patient calling to inquire if letter of work from home recommendation can be extended. Patient states this was given to her in September. Patient declined office visit. Please advise.

## 2019-05-05 NOTE — Telephone Encounter (Signed)
Okay to extend work from home letter?

## 2019-05-05 NOTE — Telephone Encounter (Signed)
Patient given advise by [facility] concerning letter.

## 2019-05-06 ENCOUNTER — Encounter: Payer: Self-pay | Admitting: Family Medicine

## 2019-05-06 ENCOUNTER — Ambulatory Visit (INDEPENDENT_AMBULATORY_CARE_PROVIDER_SITE_OTHER): Payer: BC Managed Care – PPO | Admitting: Family Medicine

## 2019-05-06 NOTE — Progress Notes (Signed)
No show. [name] Boehm Martinique, MD

## 2019-05-26 ENCOUNTER — Ambulatory Visit: Payer: BC Managed Care – PPO | Admitting: Family Medicine

## 2019-12-20 ENCOUNTER — Encounter: Payer: Self-pay | Admitting: Family Medicine

## 2019-12-20 LAB — HM DIABETES EYE EXAM

## 2020-01-25 ENCOUNTER — Other Ambulatory Visit: Payer: Self-pay

## 2020-01-25 ENCOUNTER — Ambulatory Visit (HOSPITAL_COMMUNITY)
Admission: EM | Admit: 2020-01-25 | Discharge: 2020-01-25 | Disposition: A | Discharge status: Home / Self Care | Payer: BC Managed Care – PPO | Attending: Emergency Medicine | Admitting: Emergency Medicine

## 2020-01-25 ENCOUNTER — Encounter (HOSPITAL_COMMUNITY): Payer: Self-pay | Admitting: Emergency Medicine

## 2020-01-25 DIAGNOSIS — S8010XA Contusion of unspecified lower leg, initial encounter: Secondary | ICD-10-CM | POA: Diagnosis not present

## 2020-01-25 DIAGNOSIS — M5441 Lumbago with sciatica, right side: Secondary | ICD-10-CM | POA: Diagnosis not present

## 2020-01-25 MED ORDER — NAPROXEN 500 MG PO TABS: 500.0000 mg | ORAL_TABLET | Freq: Two times a day (BID) | ORAL | 0 refills | Status: DC

## 2020-01-25 MED ORDER — TIZANIDINE HCL 4 MG PO TABS: 4.0000 mg | ORAL_TABLET | Freq: Four times a day (QID) | ORAL | 0 refills | Status: DC | PRN

## 2020-01-25 NOTE — ED Triage Notes (Signed)
Patient presents to [facility] for assessment of right lower back pain radiating down her right leg.  Patient states she was run into by multiple shopping carts on Saturday;

## 2020-01-25 NOTE — Discharge Instructions (Signed)
Naprosyn twice daily with food May supplement with tizanidine as needed, this is a muscle relaxer, please limit use to at home at bedtime as this may cause some drowsiness Alternate ice and heat to back, ice to calf Gentle stretching, avoid complete bedrest Follow-up if not seeing any improvement in approximately 4 to 5 days, may take approximately 1 to 2 weeks to fully resolve; Follow up sooner if continuing to worsen
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
## 2020-01-27 ENCOUNTER — Other Ambulatory Visit: Payer: Self-pay | Admitting: Obstetrics and Gynecology

## 2020-01-27 DIAGNOSIS — Z1231 Encounter for screening mammogram for malignant neoplasm of breast: Secondary | ICD-10-CM

## 2020-01-28 ENCOUNTER — Other Ambulatory Visit: Payer: Self-pay | Admitting: Internal Medicine

## 2020-01-28 ENCOUNTER — Ambulatory Visit
Admission: RE | Admit: 2020-01-28 | Discharge: 2020-01-28 | Disposition: A | Discharge status: Home / Self Care | Payer: BC Managed Care – PPO | Source: Ambulatory Visit | Attending: Internal Medicine | Admitting: Internal Medicine

## 2020-01-28 DIAGNOSIS — M25561 Pain in right knee: Secondary | ICD-10-CM

## 2020-01-31 ENCOUNTER — Other Ambulatory Visit: Payer: Self-pay | Admitting: Internal Medicine

## 2020-01-31 ENCOUNTER — Ambulatory Visit
Admission: RE | Admit: 2020-01-31 | Discharge: 2020-01-31 | Disposition: A | Discharge status: Home / Self Care | Payer: BC Managed Care – PPO | Source: Ambulatory Visit | Attending: Internal Medicine | Admitting: Internal Medicine

## 2020-01-31 DIAGNOSIS — M5441 Lumbago with sciatica, right side: Secondary | ICD-10-CM

## 2020-02-15 ENCOUNTER — Telehealth (INDEPENDENT_AMBULATORY_CARE_PROVIDER_SITE_OTHER): Payer: BC Managed Care – PPO | Admitting: Psychiatry

## 2020-02-15 ENCOUNTER — Encounter (HOSPITAL_COMMUNITY): Payer: Self-pay | Admitting: Psychiatry

## 2020-02-15 DIAGNOSIS — F331 Major depressive disorder, recurrent, moderate: Secondary | ICD-10-CM | POA: Diagnosis not present

## 2020-02-15 DIAGNOSIS — F411 Generalized anxiety disorder: Secondary | ICD-10-CM | POA: Diagnosis not present

## 2020-02-15 MED ORDER — ESCITALOPRAM OXALATE 10 MG PO TABS
15.0000 mg | ORAL_TABLET | Freq: Every day | ORAL | 0 refills | Status: DC
Start: 2020-02-15 — End: 2020-07-13
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
## 2020-02-28 ENCOUNTER — Other Ambulatory Visit: Payer: Self-pay

## 2020-02-28 ENCOUNTER — Ambulatory Visit
Admission: RE | Admit: 2020-02-28 | Discharge: 2020-02-28 | Disposition: A | Discharge status: Home / Self Care | Payer: BC Managed Care – PPO | Source: Ambulatory Visit | Attending: Obstetrics and Gynecology | Admitting: Obstetrics and Gynecology

## 2020-02-28 DIAGNOSIS — Z1231 Encounter for screening mammogram for malignant neoplasm of breast: Secondary | ICD-10-CM

## 2020-03-16 ENCOUNTER — Telehealth (HOSPITAL_COMMUNITY): Payer: BC Managed Care – PPO | Admitting: Psychiatry

## 2020-03-20 ENCOUNTER — Other Ambulatory Visit: Payer: Self-pay

## 2020-03-20 ENCOUNTER — Encounter: Payer: Self-pay | Admitting: *Deleted

## 2020-03-20 ENCOUNTER — Encounter: Payer: Self-pay | Admitting: Endocrinology

## 2020-03-20 ENCOUNTER — Ambulatory Visit: Payer: BC Managed Care – PPO | Admitting: Endocrinology

## 2020-03-20 VITALS — BP 144/82 | HR 78 | Ht 63.5 in | Wt 216.0 lb

## 2020-03-20 DIAGNOSIS — E1169 Type 2 diabetes mellitus with other specified complication: Secondary | ICD-10-CM

## 2020-03-20 LAB — POCT GLYCOSYLATED HEMOGLOBIN (HGB A1C): Hemoglobin A1C: 10.4 % — AB (ref 4.0–5.6)

## 2020-03-20 MED ORDER — TRULICITY 0.75 MG/0.5ML ~~LOC~~ SOAJ: 0.7500 mg | SUBCUTANEOUS | 3 refills | Status: DC

## 2020-03-20 NOTE — Patient Instructions (Addendum)
Your blood pressure is high today.  Please see your primary care provider soon, to have it rechecked good diet and exercise significantly improve the control of your diabetes.  please let me know if you wish to be referred to a dietician.  high blood sugar is very risky to your health.  you should see an eye doctor and dentist every year.  It is very important to get all recommended vaccinations.  Controlling your blood pressure and cholesterol drastically reduces the damage diabetes does to your body.  Those who smoke should quit.  Please discuss these with your doctor.  check your blood sugar 4 times a day.  vary the time of day when you check, between before the 3 meals, and at bedtime.  also check if you have symptoms of your blood sugar being too high or too low.  please keep a record of the readings and bring it to your next appointment here (or you can bring the meter itself).  You can write it on any piece of paper.  please call us sooner if your blood sugar goes below 70, or if you have a lot of readings over 200.   I have sent a prescription to your pharmacy, to add Trulicity, and: Please continue the same other diabetes medications. Please call or message [place] in 1-2 weeks, to tell us how the blood sugar is doing.  The next step is to increase the Trulicity if you are not having nausea. Blood tests are requested for you today.  We'll let you know about the results.  Please come back for a follow-up appointment in 2 months.

## 2020-03-20 NOTE — Progress Notes (Signed)
Subjective:    Patient ID: [name], female    DOB: [birth year], 54 y.o.   MRN: [id number]  HPI pt is referred by Dr Martinique, for diabetes.  Pt states DM was dx'ed in 1995, during a pregnancy; she is unaware of any chronic complications; she has been on insulin since early 2021; pt says her diet and exercise are not good; she has never had pancreatitis, pancreatic surgery, severe hypoglycemia or DKA.  She takes [place] 10-12 units qd, and 2 oral meds.  She says cbg varies from 159-290.  Past Medical History:  Diagnosis Date  . Anemia   . CHF (congestive heart failure) ([place])   . Diabetes mellitus   . Heart murmur   . Hypertension   . Pneumonia     Past Surgical History:  Procedure Laterality Date  . CESAREAN SECTION    . TUBAL LIGATION      Social History   Socioeconomic History  . Marital status: Married    Spouse name: Not on file  . Number of children: Not on file  . Years of education: Not on file  . Highest education level: Not on file  Occupational History  . Occupation: unemployed  Tobacco Use  . Smoking status: Never Smoker  . Smokeless tobacco: Never Used  Substance and Sexual Activity  . Alcohol use: Yes    Comment: occasional  . Drug use: No  . Sexual activity: Yes    Birth control/protection: None  Other Topics Concern  . Not on file  Social History Narrative   Lives with her 4 children.   Social Determinants of Health   Financial Resource Strain:   . Difficulty of Paying Living Expenses: Not on file  Food Insecurity:   . Worried About Charity fundraiser in the Last Year: Not on file  . Ran Out of Food in the Last Year: Not on file  Transportation Needs:   . Lack of Transportation (Medical): Not on file  . Lack of Transportation (Non-Medical): Not on file  Physical Activity:   . Days of Exercise per Week: Not on file  . Minutes of Exercise per Session: Not on file  Stress:   . Feeling of Stress : Not on file  Social Connections:   .  Frequency of Communication with Friends and Family: Not on file  . Frequency of Social Gatherings with Friends and Family: Not on file  . Attends Religious Services: Not on file  . Active Member of Clubs or Organizations: Not on file  . Attends Archivist Meetings: Not on file  . Marital Status: Not on file  Intimate Partner Violence:   . Fear of Current or Ex-Partner: Not on file  . Emotionally Abused: Not on file  . Physically Abused: Not on file  . Sexually Abused: Not on file    Current Outpatient Medications on File Prior to Visit  Medication Sig Dispense Refill  . amLODipine (NORVASC) 5 MG tablet Take 1 tablet (5 mg total) by mouth daily. 90 tablet 2  . ferrous sulfate (FEROSUL) 325 (65 FE) MG tablet TAKE 1 TABLET(325 MG) BY MOUTH TWICE DAILY WITH A MEAL 180 tablet 2  . metoprolol tartrate (LOPRESSOR) 25 MG tablet Take 25 mg by mouth 2 (two) times daily.    . naproxen (NAPROSYN) 500 MG tablet Take 1 tablet (500 mg total) by mouth 2 (two) times daily. 30 tablet 0  . olmesartan-hydrochlorothiazide (BENICAR HCT) 40-25 MG tablet Take 1 tablet by  mouth daily. 90 tablet 2  . sitaGLIPtin-metformin (JANUMET) 50-1000 MG tablet TAKE 1 TABLET BY MOUTH TWICE DAILY WITH A MEAL 180 tablet 2  . TRESIBA FLEXTOUCH 100 UNIT/ML FlexTouch Pen Inject into the skin.    Marland Kitchen escitalopram (LEXAPRO) 10 MG tablet Take 1.5 tablets (15 mg total) by mouth daily. (Patient not taking: Reported on 03/20/2020) 45 tablet 0  . NON FORMULARY [facility]  ANTIFUNGAL (NAIL)- #1 (Patient not taking: Reported on 03/20/2020)     No current facility-administered medications on file prior to visit.    No Known Allergies  Family History  Problem Relation Age of Onset  . Hypertension Mother   . Hypertension Brother   . Diabetes Neg Hx     BP (!) 144/82   Pulse 78   Ht 5' 3.5" (1.613 m)   Wt 216 lb (98 kg)   SpO2 99%   BMI 37.66 kg/m   Review of Systems denies weight loss, sob, and n/v. She  reports fatigue, low back pain, urinary frequency, and dry skin.  No change in chronic anxiety/depression.       Objective:   Physical Exam VITAL SIGNS:  See vs page GENERAL: no distress Pulses: dorsalis pedis intact bilat.   MSK: no deformity of the feet CV: 1+ bilat leg edema Skin:  no ulcer on the feet.  normal color and temp on the feet. Neuro: sensation is intact to touch on the feet  Lab Results  Component Value Date   HGBA1C 10.4 (H) 01/22/2019    I have reviewed outside records, and summarized: Pt was noted to have elevated A1c, and referred here.  Depression and back pain were also addressed    Assessment & Plan:  HTN: is noted today Insulin-requiring type 2 DM: uncontrolled.     Patient Instructions  Your blood pressure is high today.  Please see your primary care provider soon, to have it rechecked good diet and exercise significantly improve the control of your diabetes.  please let me know if you wish to be referred to a dietician.  high blood sugar is very risky to your health.  you should see an eye doctor and dentist every year.  It is very important to get all recommended vaccinations.  Controlling your blood pressure and cholesterol drastically reduces the damage diabetes does to your body.  Those who smoke should quit.  Please discuss these with your doctor.  check your blood sugar 4 times a day.  vary the time of day when you check, between before the 3 meals, and at bedtime.  also check if you have symptoms of your blood sugar being too high or too low.  please keep a record of the readings and bring it to your next appointment here (or you can bring the meter itself).  You can write it on any piece of paper.  please call us sooner if your blood sugar goes below 70, or if you have a lot of readings over 200.   I have sent a prescription to your pharmacy, to add Trulicity, and: Please continue the same other diabetes medications. Please call or message [place] in 1-2  weeks, to tell us how the blood sugar is doing.  The next step is to increase the Trulicity if you are not having nausea. Blood tests are requested for you today.  We'll let you know about the results.  Please come back for a follow-up appointment in 2 months.

## 2020-03-21 LAB — T4, FREE: Free T4: 0.91 ng/dL (ref 0.60–1.60)

## 2020-03-21 LAB — BASIC METABOLIC PANEL
BUN: 12 mg/dL (ref 6–23)
CO2: 30 mEq/L (ref 19–32)
Calcium: 9 mg/dL (ref 8.4–10.5)
Chloride: 95 mEq/L — ABNORMAL LOW (ref 96–112)
Creatinine, Ser: 0.78 mg/dL (ref 0.40–1.20)
GFR: 86.53 mL/min (ref >60.00)
Glucose, Bld: 314 mg/dL — ABNORMAL HIGH (ref 70–99)
Potassium: 3.4 mEq/L — ABNORMAL LOW (ref 3.5–5.1)
Sodium: 134 mEq/L — ABNORMAL LOW (ref 135–145)

## 2020-03-21 LAB — TSH: TSH: 0.73 u[IU]/mL (ref 0.35–4.50)

## 2020-05-22 ENCOUNTER — Ambulatory Visit: Payer: BC Managed Care – PPO | Admitting: Endocrinology

## 2020-05-22 ENCOUNTER — Other Ambulatory Visit: Payer: Self-pay

## 2020-05-22 NOTE — Progress Notes (Deleted)
   Subjective:    Patient ID: [name], female    DOB: 1966/03/20, 55 y.o.   MRN: [id number]  HPI  pt is referred by Dr Martinique, for diabetes.  Pt states DM was dx'ed in 1995, during a pregnancy; she is unaware of any chronic complications; she has been on insulin since early 2021; pt says her diet and exercise are not good; she has never had pancreatitis, pancreatic surgery, severe hypoglycemia or DKA.  She takes [place] 10-12 units qd, and 2 oral meds.  She says cbg varies from 159-290.   Insulin-requiring type 2 DM: uncontrolled.    Review of Systems     Objective:   Physical Exam        Assessment & Plan:

## 2020-06-22 ENCOUNTER — Other Ambulatory Visit: Payer: Self-pay

## 2020-06-26 ENCOUNTER — Ambulatory Visit: Payer: Self-pay | Admitting: Endocrinology

## 2020-07-13 ENCOUNTER — Encounter (HOSPITAL_COMMUNITY): Payer: Self-pay | Admitting: Psychiatry

## 2020-07-13 ENCOUNTER — Telehealth (INDEPENDENT_AMBULATORY_CARE_PROVIDER_SITE_OTHER): Payer: BC Managed Care – PPO | Admitting: Psychiatry

## 2020-07-13 DIAGNOSIS — F411 Generalized anxiety disorder: Secondary | ICD-10-CM

## 2020-07-13 DIAGNOSIS — F331 Major depressive disorder, recurrent, moderate: Secondary | ICD-10-CM

## 2020-07-13 MED ORDER — ESCITALOPRAM OXALATE 10 MG PO TABS
15.0000 mg | ORAL_TABLET | Freq: Every day | ORAL | 1 refills | Status: DC
Start: 2020-07-13 — End: 2021-09-18

## 2020-07-13 MED ORDER — ESCITALOPRAM OXALATE 10 MG PO TABS
15.0000 mg | ORAL_TABLET | Freq: Every day | ORAL | 1 refills | Status: DC
Start: 2020-07-13 — End: 2020-07-13

## 2020-07-13 NOTE — Progress Notes (Signed)
[facility] Follow up visit  Patient Identification: [name] MRN:  [id number] Date of Evaluation:  07/13/2020 Referral Source: primary care Chief Complaint:  depression Visit Diagnosis:    ICD-10-CM   1. MDD (major depressive disorder), recurrent episode, moderate (HCC)  F33.1   2. GAD (generalized anxiety disorder)  F41.1     Virtual Visit via Telephone Note  I connected with [name] on 07/13/20 at  3:30 PM EST by telephone and verified that I am speaking with the correct person using two identifiers.  Location: Patient: home Provider: office   I discussed the limitations, risks, security and privacy concerns of performing an evaluation and management service by telephone and the availability of in person appointments. I also discussed with the patient that there may be a patient responsible charge related to this service. The patient expressed understanding and agreed to proceed.     I discussed the assessment and treatment plan with the patient. The patient was provided an opportunity to ask questions and all were answered. The patient agreed with the plan and demonstrated an understanding of the instructions.   The patient was advised to call back or seek an in-person evaluation if the symptoms worsen or if the condition fails to improve as anticipated.  I provided 12 minutes of non-face-to-face time during this encounter.   Merian Capron, MD   History of Present Illness:  55 years old Married AA Female, referred initially  by PCP for depression  Last seen October 2021  Was recommended to fu 4 weeks,. 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 Anemia   . CHF (congestive heart failure) ([place])   . Diabetes mellitus   . Heart murmur   . Hypertension   . Pneumonia     Past Surgical History:  Procedure Laterality Date  . CESAREAN SECTION    . TUBAL LIGATION      Family Psychiatric History: Uncle: some mental illness, could not elaborate but he was admitted in psych hospital   Family History:  Family History  Problem Relation Age of Onset  . Hypertension Mother   . Hypertension Brother   . Diabetes Neg Hx     Social History:   Social History   Socioeconomic History  . Marital status: Married    Spouse name: Not on file  . Number of children: Not on file  . Years of education: Not on file  . Highest education level: Not on file  Occupational History  . Occupation: unemployed  Tobacco Use  . Smoking status: Never Smoker  . Smokeless tobacco: Never Used  Substance and Sexual Activity  . Alcohol use: Yes    Comment: occasional  . Drug use: No  . Sexual activity: Yes    Birth control/protection: None  Other Topics Concern  . Not on file  Social History Narrative  Lives with her 4 children.   Social Determinants of Health   Financial Resource Strain: Not on file  Food Insecurity: Not on file  Transportation Needs: Not on file  Physical Activity: Not on file  Stress: Not on file  Social Connections: Not on file      Allergies:  No Known Allergies  Metabolic Disorder Labs: Lab Results  Component Value Date   HGBA1C 10.4 (A) 03/20/2020   MPG 246 03/12/2016   MPG 214 (H) 01/25/2014   No results found for: PROLACTIN Lab Results  Component Value Date   CHOL 155 01/25/2014   TRIG 63 01/25/2014    HDL 53 01/25/2014   CHOLHDL 2.9 01/25/2014   VLDL 13 01/25/2014   LDLCALC 89 01/25/2014   LDLCALC 108 (H) 07/22/2007   Lab Results  Component Value Date   TSH 0.73 03/20/2020    Therapeutic Level Labs: No results found for: LITHIUM No results found for: CBMZ No results found for: VALPROATE  Current Medications: Current Outpatient Medications  Medication Sig Dispense Refill  . amLODipine (NORVASC) 5 MG tablet Take 1 tablet (5 mg total) by mouth daily. 90 tablet 2  . Dulaglutide (TRULICITY) 9.21 JH/4.1DE SOPN Inject 0.75 mg into the skin once a week. 6 mL 3  . escitalopram (LEXAPRO) 10 MG tablet Take 1.5 tablets (15 mg total) by mouth daily. Delete prior refills 45 tablet 1  . ferrous sulfate (FEROSUL) 325 (65 FE) MG tablet TAKE 1 TABLET(325 MG) BY MOUTH TWICE DAILY WITH A MEAL 180 tablet 2  . metoprolol tartrate (LOPRESSOR) 25 MG tablet Take 25 mg by mouth 2 (two) times daily.    . naproxen (NAPROSYN) 500 MG tablet Take 1 tablet (500 mg total) by mouth 2 (two) times daily. 30 tablet 0  . NON FORMULARY [facility]  ANTIFUNGAL (NAIL)- #1 (Patient not taking: Reported on 03/20/2020)    . olmesartan-hydrochlorothiazide (BENICAR HCT) 40-25 MG tablet Take 1 tablet by mouth daily. 90 tablet 2  . sitaGLIPtin-metformin (JANUMET) 50-1000 MG tablet TAKE 1 TABLET BY MOUTH TWICE DAILY WITH A MEAL 180 tablet 2  . TRESIBA FLEXTOUCH 100 UNIT/ML FlexTouch Pen Inject into the skin.     No current facility-administered medications for this visit.      Psychiatric Specialty Exam: Review of Systems  Cardiovascular: Negative for chest pain.  Psychiatric/Behavioral: Positive for dysphoric mood. Negative for agitation and self-injury.    There were no vitals taken for this visit.There is no height or weight on file to calculate BMI.  General Appearance: Casual  Eye Contact:  Fair  Speech:  Slow  Volume:  Decreased  Mood:  subdued  Affect:  Congruent  Thought Process:  Goal Directed   Orientation:  Full (Time, Place, and Person)  Thought Content:  Rumination  Suicidal Thoughts:  No  Homicidal Thoughts:  No  Memory:  Immediate;   Fair Recent;   Fair  Judgement:  Fair  Insight:  Shallow  Psychomotor Activity:  Decreased  Concentration:  Concentration: Fair and Attention Span: Fair  Recall:  [employer] of Knowledge:Good  Language: Good  Akathisia:  No  Handed:  AIMS (if indicated):  not done  Assets:  Desire for Improvement Housing Social Support  ADL's:  Intact  Cognition: WNL  Sleep:  Fair   Screenings: GAD-7   [place] Office Visit from 02/22/2019 in Primary Care at [facility]  Total GAD-7 Score 21    PHQ2-9   Flowsheet Row Video Visit from 07/13/2020 in [facility]  [place] Office Visit from 02/22/2019 in Primary Care at [facility] from 12/08/2012 in [facility]  PHQ-2 Total Score 2 3 0  PHQ-9 Total Score 11 14 --    Flowsheet Row Video Visit from 07/13/2020 in [facility] No Risk      Assessment and Plan: as follows Prior documentation reviewed   MDD, moderate,  Subdued, discussed compliance and increase lexapro to 15mg . Recommend therapy , she will call to reschedule to work on her stressors and coping skills  Medical co morbidity of HTN, Diabetes, Anemia may be contributing to feeling of low energy as well. FU with providers for ongoing care of medical co morbidities  GAD: stressed, increase lexapro as above, work on distractions  Recommend adding ME time, distract from negative thoughts  Fu 4 weeks or earlier if needed  Merian Capron, MD 3/3/20223:53 PM

## 2020-07-27 ENCOUNTER — Other Ambulatory Visit: Payer: Self-pay

## 2020-07-27 ENCOUNTER — Ambulatory Visit (INDEPENDENT_AMBULATORY_CARE_PROVIDER_SITE_OTHER): Payer: BC Managed Care – PPO | Admitting: Endocrinology

## 2020-07-27 VITALS — BP 150/86 | HR 78 | Ht 64.0 in | Wt 219.4 lb

## 2020-07-27 DIAGNOSIS — E1169 Type 2 diabetes mellitus with other specified complication: Secondary | ICD-10-CM

## 2020-07-27 MED ORDER — TRESIBA FLEXTOUCH 100 UNIT/ML ~~LOC~~ SOPN
20.0000 [IU] | PEN_INJECTOR | Freq: Every day | SUBCUTANEOUS | 3 refills | Status: DC
Start: 2020-07-27 — End: 2020-12-04

## 2020-07-27 MED ORDER — JANUMET XR 50-1000 MG PO TB24: 2.0000 | ORAL_TABLET | Freq: Every day | ORAL | 3 refills | Status: DC

## 2020-07-27 NOTE — Progress Notes (Signed)
   Subjective:    Patient ID: [name], female    DOB: 1966-02-16, 55 y.o.   MRN: [id number]  HPI Pt returns for f/u of diabetes mellitus: DM type:  Dx'ed: 1995, during a pregnancy Complications:  Therapy: insulin since 2021 DKA: never Severe hypoglycemia: never Pancreatitis: never Pancreatic imaging: never SDOH: she does not check cbg's Other: she is not a candidate for multiple daily injections, due to noncompliance. Interval history: Pt says she has run out of meds.  She stopped Trulicity, because she feels she is on too much medicine.  Tyler Aas is 14 units qd.  She does not check cbg's.   Lab Results  Component Value Date   CREATININE 0.78 03/20/2020   BUN 12 03/20/2020   NA 134 (L) 03/20/2020   K 3.4 (L) 03/20/2020   CL 95 (L) 03/20/2020   CO2 30 03/20/2020   Lab Results  Component Value Date   TSH 0.73 03/20/2020     Review of Systems She has gained a few lbs.      Objective:   Physical Exam VITAL SIGNS:  See vs page GENERAL: no distress Pulses: dorsalis pedis intact bilat.   MSK: no deformity of the feet CV: trace right leg edema (none on the left).  She sees ortho.   Skin:  no ulcer on the feet.  normal color and temp on the feet.  Neuro: sensation is intact to touch on the feet.     A1c=9.0%    Assessment & Plan:  HTN: is noted today.  Insulin-requiring type 2 DM: uncontrolled.   Patient Instructions  Your blood pressure is high today.  Please see your primary care provider soon, to have it rechecked check your blood sugar 4 times a day.  vary the time of day when you check, between before the 3 meals, and at bedtime.  also check if you have symptoms of your blood sugar being too high or too low.  please keep a record of the readings and bring it to your next appointment here (or you can bring the meter itself).  You can write it on any piece of paper.  please call us sooner if your blood sugar goes below 70, or if you have a lot of readings over  200.   I have sent a prescription to your pharmacy, to increase the Tresiba to 20 units per day Please continue the same Janumet.   Please come back for a follow-up appointment in 2 months.

## 2020-07-27 NOTE — Patient Instructions (Addendum)
Your blood pressure is high today.  Please see your primary care provider soon, to have it rechecked check your blood sugar 4 times a day.  vary the time of day when you check, between before the 3 meals, and at bedtime.  also check if you have symptoms of your blood sugar being too high or too low.  please keep a record of the readings and bring it to your next appointment here (or you can bring the meter itself).  You can write it on any piece of paper.  please call us sooner if your blood sugar goes below 70, or if you have a lot of readings over 200.   I have sent a prescription to your pharmacy, to increase the Tresiba to 20 units per day Please continue the same Janumet.   Please come back for a follow-up appointment in 2 months.

## 2020-07-28 LAB — MICROALBUMIN / CREATININE URINE RATIO
Creatinine,U: 104.6 mg/dL
Microalb Creat Ratio: 20.2 mg/g (ref 0.0–30.0)
Microalb, Ur: 21.1 mg/dL — ABNORMAL HIGH (ref 0.0–1.9)

## 2020-08-01 ENCOUNTER — Ambulatory Visit: Payer: Self-pay | Admitting: Orthopaedic Surgery

## 2020-08-08 ENCOUNTER — Ambulatory Visit: Payer: BC Managed Care – PPO | Admitting: Orthopaedic Surgery

## 2020-08-08 ENCOUNTER — Encounter: Payer: Self-pay | Admitting: Orthopaedic Surgery

## 2020-08-08 DIAGNOSIS — M1711 Unilateral primary osteoarthritis, right knee: Secondary | ICD-10-CM | POA: Diagnosis not present

## 2020-08-08 DIAGNOSIS — M545 Low back pain, unspecified: Secondary | ICD-10-CM

## 2020-08-08 DIAGNOSIS — G8929 Other chronic pain: Secondary | ICD-10-CM

## 2020-08-14 DIAGNOSIS — M1711 Unilateral primary osteoarthritis, right knee: Secondary | ICD-10-CM | POA: Insufficient documentation

## 2020-08-14 DIAGNOSIS — M545 Low back pain, unspecified: Secondary | ICD-10-CM | POA: Insufficient documentation
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
## 2020-08-23 ENCOUNTER — Ambulatory Visit (INDEPENDENT_AMBULATORY_CARE_PROVIDER_SITE_OTHER): Payer: BC Managed Care – PPO | Admitting: Licensed Clinical Social Worker

## 2020-08-23 DIAGNOSIS — F411 Generalized anxiety disorder: Secondary | ICD-10-CM

## 2020-08-23 DIAGNOSIS — F331 Major depressive disorder, recurrent, moderate: Secondary | ICD-10-CM | POA: Diagnosis not present

## 2020-08-24 ENCOUNTER — Encounter (HOSPITAL_COMMUNITY): Payer: Self-pay | Admitting: Psychiatry

## 2020-08-24 ENCOUNTER — Telehealth (INDEPENDENT_AMBULATORY_CARE_PROVIDER_SITE_OTHER): Payer: BC Managed Care – PPO | Admitting: Psychiatry

## 2020-08-24 DIAGNOSIS — F411 Generalized anxiety disorder: Secondary | ICD-10-CM

## 2020-08-24 DIAGNOSIS — F331 Major depressive disorder, recurrent, moderate: Secondary | ICD-10-CM

## 2020-08-24 MED ORDER — BUPROPION HCL ER (SR) 100 MG PO TB12: 100.0000 mg | ORAL_TABLET | Freq: Every day | ORAL | 1 refills | Status: DC

## 2020-08-24 NOTE — Progress Notes (Signed)
[facility] Follow up visit  Patient Identification: [name] MRN:  [id number] Date of Evaluation:  08/24/2020 Referral Source: primary care Chief Complaint:  depression Visit Diagnosis:    ICD-10-CM   1. MDD (major depressive disorder), recurrent episode, moderate (HCC)  F33.1   2. GAD (generalized anxiety disorder)  F41.1    Virtual Visit via Video Note  I connected with [name] on 08/24/20 at  3:45 PM EDT by a video enabled telemedicine application and verified that I am speaking with the correct person using two identifiers. Meeting was video later audio at the end Location: Patient: office Provider: office   I discussed the limitations of evaluation and management by telemedicine and the availability of in person appointments. The patient expressed understanding and agreed to proceed.       I discussed the assessment and treatment plan with the patient. The patient was provided an opportunity to ask questions and all were answered. The patient agreed with the plan and demonstrated an understanding of the instructions.   The patient was advised to call back or seek an in-person evaluation if the symptoms worsen or if the condition fails to improve as anticipated.  I provided 15  minutes of non-face-to-face time during this encounter.    History of Present Illness:  55 years old Married AA Female, referred initially  by PCP for depression   Last visit suggested to restart lexapro and increase to 15mg . 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 Anemia   . CHF (congestive heart failure) ([place])   . Diabetes mellitus   . Heart murmur   . Hypertension   . Pneumonia     Past Surgical History:  Procedure Laterality Date  . CESAREAN SECTION    . TUBAL LIGATION      Family Psychiatric History: Uncle: some mental illness, could not elaborate but he was admitted in psych hospital   Family History:  Family History  Problem Relation Age of Onset  . Hypertension Mother   . Hypertension Brother   . Diabetes Neg Hx     Social History:   Social History   Socioeconomic History  . Marital status: Married    Spouse name: Not on file  . Number of children: Not on file  . Years of education: Not on file  . Highest education level: Not on file  Occupational History  . Occupation: unemployed  Tobacco Use  . Smoking status: Never Smoker  . Smokeless tobacco: Never Used  Substance and Sexual Activity  . Alcohol use: Yes    Comment: occasional  . Drug use: No  . Sexual activity: Yes    Birth control/protection: None  Other Topics Concern  . Not on file  Social History Narrative   Lives with her 4 children.   Social Determinants of Health   Financial Resource Strain: Not on file  Food Insecurity: Not on file  Transportation Needs: Not on file  Physical Activity: Not on file  Stress: Not on file  Social Connections: Not on file      Allergies:  No Known Allergies  Metabolic Disorder Labs: Lab Results  Component Value Date   HGBA1C 10.4 (A) 03/20/2020   MPG 246 03/12/2016   MPG 214 (H) 01/25/2014   No results found for: PROLACTIN Lab Results  Component Value Date   CHOL 155 01/25/2014   TRIG 63 01/25/2014   HDL 53 01/25/2014   CHOLHDL 2.9 01/25/2014   VLDL 13 01/25/2014   LDLCALC 89 01/25/2014   LDLCALC 108 (H) 07/22/2007   Lab Results  Component  Value Date   TSH 0.73 03/20/2020    Therapeutic Level Labs: No results found for: LITHIUM No results found for: CBMZ No results found for: VALPROATE  Current Medications: Current Outpatient Medications  Medication Sig Dispense Refill  . buPROPion (WELLBUTRIN SR) 100 MG 12 hr tablet Take 1 tablet (100 mg total) by mouth daily. 30 tablet 1  . amLODipine (NORVASC) 5 MG tablet Take 1 tablet (5 mg total) by mouth daily. 90 tablet 2  . escitalopram (LEXAPRO) 10 MG tablet Take 1.5 tablets (15 mg total) by mouth daily. Delete prior refills 45 tablet 1  . etodolac (LODINE) 400 MG tablet Take 400 mg by mouth 2 (two) times daily.    . ferrous sulfate (FEROSUL) 325 (65 FE) MG tablet TAKE 1 TABLET(325 MG) BY MOUTH TWICE DAILY WITH A MEAL 180 tablet 2  . insulin degludec (TRESIBA FLEXTOUCH) 100 UNIT/ML FlexTouch Pen Inject 20 Units into the skin daily. And 31G, 68mm pen needles 1/day 30 mL 3  . metoprolol tartrate (LOPRESSOR) 25 MG tablet Take 25 mg by mouth 2 (two) times daily.    . naproxen (NAPROSYN) 500 MG tablet Take 1 tablet (500 mg total) by mouth 2 (two) times daily. (Patient not taking: Reported on 08/08/2020) 30 tablet 0  . NON FORMULARY [facility]  ANTIFUNGAL (NAIL)- #1    . olmesartan-hydrochlorothiazide (BENICAR HCT) 40-25 MG tablet Take 1 tablet by mouth daily. 90 tablet 2  . SitaGLIPtin-MetFORMIN HCl (JANUMET XR) 50-1000 MG TB24 Take 2 tablets by mouth daily. 180 tablet 3   No current facility-administered medications for this visit.      Psychiatric Specialty Exam: Review of Systems  Psychiatric/Behavioral: Positive for dysphoric mood. Negative for agitation and self-injury.    There were no vitals taken for this visit.There is no height or weight on file to calculate BMI.  General Appearance: Casual  Eye Contact:  Fair  Speech:  Slow  Volume:  Decreased  Mood:  subdued  Affect:  Congruent  Thought Process:  Goal Directed  Orientation:  Full (Time, Place, and  Person)  Thought Content:  Rumination  Suicidal Thoughts:  No  Homicidal Thoughts:  No  Memory:  Immediate;   Fair Recent;   Fair  Judgement:  Fair  Insight:  Shallow  Psychomotor Activity:  Decreased  Concentration:  Concentration: Fair and Attention Span: Fair  Recall:  [employer] of Knowledge:Good  Language: Good  Akathisia:  No  Handed:  AIMS (if indicated):  not done  Assets:  Desire for Improvement Housing Social Support  ADL's:  Intact  Cognition: WNL  Sleep:  Fair   Screenings: GAD-7   [place] Office Visit from 02/22/2019 in Primary Care at [facility]  Total GAD-7 Score 21    PHQ2-9   Flowsheet Row Video Visit from 07/13/2020 in [facility] Office Visit  from 02/22/2019 in Primary Care at [facility] from 12/08/2012 in [facility]  PHQ-2 Total Score 2 3 0  PHQ-9 Total Score 11 14 --    Flowsheet Row Video Visit from 07/13/2020 in [facility] No Risk      Assessment and Plan: as follows Prior documentation reviewed   MDD, moderate,  Subdued, not taking lexapro regularly, will stop and start wellbutrin as she wants something to work on tiredness and amotivation as well     Medical co morbidity of HTN, Diabetes, Anemia may be contributing to feeling of low energy as well. FU with providers for ongoing care of medical co morbidities  GAD: gets stressed, discussed to have balance in work and continue therapy to work on Radiographer, therapeutic  Fu 4 weeks, I prefer to see in office,  Merian Capron, MD 4/14/20224:07 PM
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
## 2020-09-12 ENCOUNTER — Other Ambulatory Visit: Payer: Self-pay

## 2020-09-12 ENCOUNTER — Ambulatory Visit: Payer: BC Managed Care – PPO | Admitting: Orthopaedic Surgery

## 2020-09-12 ENCOUNTER — Encounter: Payer: Self-pay | Admitting: Orthopaedic Surgery

## 2020-09-12 VITALS — BP 150/90 | HR 78 | Ht 61.0 in | Wt 220.0 lb

## 2020-09-12 DIAGNOSIS — M545 Low back pain, unspecified: Secondary | ICD-10-CM

## 2020-09-12 DIAGNOSIS — M1711 Unilateral primary osteoarthritis, right knee: Secondary | ICD-10-CM | POA: Diagnosis not present

## 2020-09-12 DIAGNOSIS — G8929 Other chronic pain: Secondary | ICD-10-CM
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
## 2020-09-25 ENCOUNTER — Other Ambulatory Visit: Payer: Self-pay

## 2020-09-25 ENCOUNTER — Ambulatory Visit: Payer: BC Managed Care – PPO | Admitting: Endocrinology

## 2020-09-25 VITALS — BP 196/100 | HR 85 | Ht 61.0 in | Wt 223.4 lb

## 2020-09-25 DIAGNOSIS — M79671 Pain in right foot: Secondary | ICD-10-CM | POA: Diagnosis not present

## 2020-09-25 DIAGNOSIS — E1169 Type 2 diabetes mellitus with other specified complication: Secondary | ICD-10-CM | POA: Diagnosis not present

## 2020-09-25 LAB — POCT GLYCOSYLATED HEMOGLOBIN (HGB A1C): Hemoglobin A1C: 10.1 % — AB (ref 4.0–5.6)

## 2020-09-25 MED ORDER — JANUMET XR 50-1000 MG PO TB24
2.0000 | ORAL_TABLET | Freq: Every day | ORAL | 3 refills | Status: DC
Start: 2020-09-25 — End: 2021-09-18

## 2020-09-25 NOTE — Patient Instructions (Addendum)
Your blood pressure is high today.  Please see your primary care provider soon, to have it rechecked check your blood sugar 4 times a day.  vary the time of day when you check, between before the 3 meals, and at bedtime.  also check if you have symptoms of your blood sugar being too high or too low.  please keep a record of the readings and bring it to your next appointment here (or you can bring the meter itself).  You can write it on any piece of paper.  please call us sooner if your blood sugar goes below 70, or if you have a lot of readings over 200.   please increase the Tresiba to 20 units per day.  I have sent a prescription to your pharmacy, to refill the [place].   Please see a foot specialist.  you will receive a phone call, about a day and time for an appointment.   Please come back for a follow-up appointment in 2 months.

## 2020-09-25 NOTE — Progress Notes (Signed)
Subjective:    Patient ID: [name], female    DOB: 07-Feb-1966, 55 y.o.   MRN: [id number]  HPI Pt returns for f/u of diabetes mellitus:   DM type: Insulin-requiring type 2.   Dx'ed: 1995, during a pregnancy Complications:  Therapy: insulin since 2021 DKA: never Severe hypoglycemia: never Pancreatitis: never Pancreatic imaging: never SDOH: she does not check cbg's; She stopped Trulicity, because she feels she is on too much medicine.   Other: she is not a candidate for multiple daily injections, due to noncompliance.   Interval history: Pt says she misses the insulin approx twice per week.  When she takes it, she takes just 12-14 units/d.  She has not recently taken [place].   Past Medical History:  Diagnosis Date  . Anemia   . CHF (congestive heart failure) ([place])   . Diabetes mellitus   . Heart murmur   . Hypertension   . Pneumonia     Past Surgical History:  Procedure Laterality Date  . CESAREAN SECTION    . TUBAL LIGATION      Social History   Socioeconomic History  . Marital status: Married    Spouse name: Not on file  . Number of children: Not on file  . Years of education: Not on file  . Highest education level: Not on file  Occupational History  . Occupation: unemployed  Tobacco Use  . Smoking status: Never Smoker  . Smokeless tobacco: Never Used  Substance and Sexual Activity  . Alcohol use: Yes    Comment: occasional  . Drug use: No  . Sexual activity: Yes    Birth control/protection: None  Other Topics Concern  . Not on file  Social History Narrative   Lives with her 4 children.   Social Determinants of Health   Financial Resource Strain: Not on file  Food Insecurity: Not on file  Transportation Needs: Not on file  Physical Activity: Not on file  Stress: Not on file  Social Connections: Not on file  Intimate Partner Violence: Not on file    Current Outpatient Medications on File Prior to Visit  Medication Sig Dispense Refill  .  amLODipine (NORVASC) 5 MG tablet Take 1 tablet (5 mg total) by mouth daily. 90 tablet 2  . buPROPion (WELLBUTRIN SR) 100 MG 12 hr tablet Take 1 tablet (100 mg total) by mouth daily. 30 tablet 1  . escitalopram (LEXAPRO) 10 MG tablet Take 1.5 tablets (15 mg total) by mouth daily. Delete prior refills 45 tablet 1  . etodolac (LODINE) 400 MG tablet Take 400 mg by mouth 2 (two) times daily.    . ferrous sulfate (FEROSUL) 325 (65 FE) MG tablet TAKE 1 TABLET(325 MG) BY MOUTH TWICE DAILY WITH A MEAL 180 tablet 2  . insulin degludec (TRESIBA FLEXTOUCH) 100 UNIT/ML FlexTouch Pen Inject 20 Units into the skin daily. And 31G, 57mm pen needles 1/day 30 mL 3  . metoprolol tartrate (LOPRESSOR) 25 MG tablet Take 25 mg by mouth 2 (two) times daily.    . naproxen (NAPROSYN) 500 MG tablet Take 1 tablet (500 mg total) by mouth 2 (two) times daily. 30 tablet 0  . NON FORMULARY [facility]  ANTIFUNGAL (NAIL)- #1    . olmesartan-hydrochlorothiazide (BENICAR HCT) 40-25 MG tablet Take 1 tablet by mouth daily. 90 tablet 2   No current facility-administered medications on file prior to visit.    No Known Allergies  Family History  Problem Relation Age of Onset  .  Hypertension Mother   . Hypertension Brother   . Diabetes Neg Hx     BP (!) 196/100 (BP Location: Right Arm, Patient Position: Sitting, Cuff Size: Large)   Pulse 85   Ht 5\' 1"  (1.549 m)   Wt 223 lb 6.4 oz (101.3 kg)   SpO2 98%   BMI 42.21 kg/m    Review of Systems She reports pain of the right foot toes 3-5    Objective:   Physical Exam VITAL SIGNS:  See vs page GENERAL: no distress Pulses: dorsalis pedis intact bilat.   MSK: no deformity of the feet CV: 2+ right, and 1+ left leg edema Skin:  no ulcer on the feet.  normal color and temp on the feet. Neuro: sensation is intact to touch on the feet.    Lab Results  Component Value Date   CREATININE 0.78 03/20/2020   BUN 12 03/20/2020   NA 134 (L) 03/20/2020   K 3.4 (L)  03/20/2020   CL 95 (L) 03/20/2020   CO2 30 03/20/2020      Lab Results  Component Value Date   HGBA1C 10.1 (A) 09/25/2020       Assessment & Plan:  Insulin-requiring type 2 DM: uncontrolled Foot pain, new, uncertain etiology and prognosis.   Patient Instructions  Your blood pressure is high today.  Please see your primary care provider soon, to have it rechecked check your blood sugar 4 times a day.  vary the time of day when you check, between before the 3 meals, and at bedtime.  also check if you have symptoms of your blood sugar being too high or too low.  please keep a record of the readings and bring it to your next appointment here (or you can bring the meter itself).  You can write it on any piece of paper.  please call us sooner if your blood sugar goes below 70, or if you have a lot of readings over 200.   please increase the Tresiba to 20 units per day.  I have sent a prescription to your pharmacy, to refill the [place].   Please see a foot specialist.  you will receive a phone call, about a day and time for an appointment.   Please come back for a follow-up appointment in 2 months.

## 2020-09-28 ENCOUNTER — Telehealth (HOSPITAL_COMMUNITY): Payer: BC Managed Care – PPO | Admitting: Psychiatry

## 2020-10-23 ENCOUNTER — Encounter: Payer: Self-pay | Admitting: Podiatry

## 2020-10-23 ENCOUNTER — Other Ambulatory Visit: Payer: Self-pay

## 2020-10-23 ENCOUNTER — Ambulatory Visit: Payer: BC Managed Care – PPO | Admitting: Podiatry

## 2020-10-23 ENCOUNTER — Ambulatory Visit (INDEPENDENT_AMBULATORY_CARE_PROVIDER_SITE_OTHER): Payer: BC Managed Care – PPO

## 2020-10-23 DIAGNOSIS — M79675 Pain in left toe(s): Secondary | ICD-10-CM | POA: Diagnosis not present

## 2020-10-23 DIAGNOSIS — B351 Tinea unguium: Secondary | ICD-10-CM

## 2020-10-23 DIAGNOSIS — M79674 Pain in right toe(s): Secondary | ICD-10-CM | POA: Diagnosis not present

## 2020-10-23 DIAGNOSIS — B353 Tinea pedis: Secondary | ICD-10-CM | POA: Diagnosis not present

## 2020-10-23 MED ORDER — CLOTRIMAZOLE-BETAMETHASONE 1-0.05 % EX CREA: 1.0000 "application " | TOPICAL_CREAM | Freq: Two times a day (BID) | CUTANEOUS | 3 refills | Status: DC

## 2020-10-30 NOTE — Progress Notes (Signed)
   SUBJECTIVE Patient presents to office today complaining of elongated, thickened nails that cause pain while ambulating in shoes.  She is unable to trim her own nails.   Patient also states that she has been having trouble with the bottom of the feet regarding some burning sensation in between the toes.  This is been ongoing for a few weeks now.  She has not done anything for treatment.  Patient is here for further evaluation and treatment.  Past Medical History:  Diagnosis Date   Anemia    CHF (congestive heart failure) (HCC)    Diabetes mellitus    Heart murmur    Hypertension    Pneumonia     OBJECTIVE General Patient is awake, alert, and oriented x 3 and in no acute distress. Derm Skin is dry and supple bilateral. Negative open lesions or macerations. Remaining integument unremarkable. Nails are tender, long, thickened and dystrophic with subungual debris, consistent with onychomycosis, 1-5 bilateral. No signs of infection noted. Vasc  DP and PT pedal pulses palpable bilaterally. Temperature gradient within normal limits.  Neuro Epicritic and protective threshold sensation grossly intact bilaterally.  Musculoskeletal Exam No symptomatic pedal deformities noted bilateral. Muscular strength within normal limits. Radiographic exam normal osseous mineralization.  Joint spaces preserved.  No significant degenerative changes or spurring noted.  No fractures identified.  ASSESSMENT 1.  Pain due to onychomycosis of toenails both 2.  Interdigital tinea pedis bilateral  PLAN OF CARE 1. Patient evaluated today.  2. Instructed to maintain good pedal hygiene and foot care.  3. Mechanical debridement of nails 1-5 bilaterally performed using a nail nipper. Filed with dremel without incident.  4.  Prescription for Lotrisone cream applied to the interdigital areas daily as needed  5.  Recommend OTC foot lotion daily  6.  Recommend shoes at [employer]  7.  Return to clinic in 3  mos.    Edrick Kins, DPM [facility]  Dr. Edrick Kins, DPM    [address], [place] [address]                Office [phone]  Fax [phone]

## 2020-12-04 ENCOUNTER — Encounter (HOSPITAL_COMMUNITY): Payer: Self-pay

## 2020-12-04 ENCOUNTER — Ambulatory Visit: Payer: BC Managed Care – PPO | Admitting: Endocrinology

## 2020-12-04 ENCOUNTER — Ambulatory Visit (HOSPITAL_COMMUNITY)
Admission: EM | Admit: 2020-12-04 | Discharge: 2020-12-04 | Disposition: A | Discharge status: Home / Self Care | Payer: BC Managed Care – PPO | Attending: Physician Assistant | Admitting: Physician Assistant

## 2020-12-04 ENCOUNTER — Other Ambulatory Visit: Payer: Self-pay

## 2020-12-04 VITALS — BP 190/110 | HR 112 | Ht 61.0 in | Wt 223.8 lb

## 2020-12-04 DIAGNOSIS — I10 Essential (primary) hypertension: Secondary | ICD-10-CM

## 2020-12-04 DIAGNOSIS — G44209 Tension-type headache, unspecified, not intractable: Secondary | ICD-10-CM

## 2020-12-04 DIAGNOSIS — H539 Unspecified visual disturbance: Secondary | ICD-10-CM | POA: Diagnosis not present

## 2020-12-04 DIAGNOSIS — E1169 Type 2 diabetes mellitus with other specified complication: Secondary | ICD-10-CM

## 2020-12-04 DIAGNOSIS — I110 Hypertensive heart disease with heart failure: Secondary | ICD-10-CM

## 2020-12-04 LAB — POCT GLYCOSYLATED HEMOGLOBIN (HGB A1C): Hemoglobin A1C: 9.2 % — AB (ref 4.0–5.6)

## 2020-12-04 MED ORDER — AMLODIPINE BESYLATE 10 MG PO TABS
10.0000 mg | ORAL_TABLET | Freq: Every day | ORAL | 0 refills | Status: DC
Start: 2020-12-04 — End: 2020-12-04

## 2020-12-04 MED ORDER — TRESIBA FLEXTOUCH 100 UNIT/ML ~~LOC~~ SOPN
30.0000 [IU] | PEN_INJECTOR | Freq: Every day | SUBCUTANEOUS | 3 refills | Status: DC
Start: 2020-12-04 — End: 2021-09-18

## 2020-12-04 MED ORDER — OLMESARTAN MEDOXOMIL-HCTZ 40-25 MG PO TABS: 1.0000 | ORAL_TABLET | Freq: Every day | ORAL | 0 refills | Status: DC

## 2020-12-04 MED ORDER — AMLODIPINE BESYLATE 10 MG PO TABS: 10.0000 mg | ORAL_TABLET | Freq: Every day | ORAL | 0 refills | Status: DC

## 2020-12-04 NOTE — ED Provider Notes (Signed)
[facility]    CSN: [id number] Arrival date & time: 12/04/20  1648      History   Chief Complaint Chief Complaint  Patient presents with   Headache    HPI [name] is a 55 y.o. female.   Patient presents today with a 2-day history of headache and vision changes.  She describes this as increased difficulty seeing fine print.  She does have a history of being farsighted and has glasses but has not been using them regularly.  She does not wear contacts.  She reports headaches has gradually been improving and is currently rated 5 on a 0-10 pain scale, localized to frontal area without radiation, described as aching/tension, no aggravating relieving factors identified.  She was seen by her endocrinologist and noted to have elevated blood pressure at 190/110.  She reports several recent medication changes that she has been without her metoprolol for several months and is only taking half tablet of olmesartan/hydrochlorothiazide.  She has not been monitoring her blood pressure at home.  Denies any recent sodium increase, caffeine increase, decongestant use.  She does not take NSAIDs on a regular basis.   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 08/16/2016   Class 2 obesity with body mass index (BMI) of 37.0 to 37.9 in adult 04/22/2016   Noncompliance 03/12/2016   Excessive or frequent menstruation 02/08/2013   Leiomyoma of uterus, unspecified 02/08/2013   Hypertensive heart disease with heart failure ([place]) 05/06/2012   CHF, acute ([place]) 05/06/2012   IDA (iron deficiency anemia) 11/18/2008   Type 2 diabetes mellitus with other specified complication  (HCC)/HTN and CHF 11/17/2008   HYPERTENSION, BENIGN ESSENTIAL 11/17/2008    Past Surgical History:  Procedure Laterality Date   CESAREAN SECTION     TUBAL LIGATION      OB History     Gravida  6   Para  4   Term  3   Preterm  1   AB  2   Living  4      SAB      IAB  2   Ectopic      Multiple      Live Births  4            Home Medications    Prior to Admission medications   Medication Sig Start Date End Date Taking? Authorizing Provider  amLODipine (NORVASC) 10 MG tablet Take 1 tablet (10 mg total) by mouth daily. 12/04/20   Aseneth Hack, Derry Skill, PA-C  buPROPion (WELLBUTRIN SR) 100 MG 12 hr tablet Take 1 tablet (100 mg total) by mouth daily. 08/24/20   Merian Capron, MD  clotrimazole-betamethasone (LOTRISONE) cream Apply 1 application topically 2 (two) times daily. 10/23/20   Edrick Kins, DPM  escitalopram (LEXAPRO) 10 MG tablet Take 1.5 tablets (15 mg total) by mouth daily. Delete prior refills 07/13/20   Merian Capron, MD  etodolac (LODINE) 400 MG tablet Take 400 mg by mouth 2 (two) times daily. 07/24/20   [provider]  ferrous sulfate (FEROSUL) 325 (65 FE) MG tablet TAKE  1 TABLET(325 MG) BY MOUTH TWICE DAILY WITH A MEAL 01/22/19   [place], Betty G, MD  insulin degludec (TRESIBA FLEXTOUCH) 100 UNIT/ML FlexTouch Pen Inject 30 Units into the skin daily. And 31G, 13m pen needles 1/day 12/04/20   ERenato Shin MD  metoprolol tartrate (LOPRESSOR) 25 MG tablet Take 25 mg by mouth 2 (two) times daily. 02/08/20   [provider]  naproxen (NAPROSYN) 500 MG tablet Take 1 tablet (500 mg total) by mouth 2 (two) times daily. 01/25/20   Wieters, Hallie C, PA-C  NON FORMULARY [facility]  ANTIFUNGAL (NAIL)- #1    [provider]  olmesartan-hydrochlorothiazide (BENICAR HCT) 40-25 MG tablet Take 1 tablet by mouth daily. 01/22/19   JMartinique Betty G, MD  SitaGLIPtin-MetFORMIN HCl (JANUMET XR) 50-1000 MG TB24 Take 2 tablets by mouth daily. 09/25/20    ERenato Shin MD    Family History Family History  Problem Relation Age of Onset   Hypertension Mother    Hypertension Brother    Diabetes Neg Hx     Social History Social History   Tobacco Use   Smoking status: Never   Smokeless tobacco: Never  Substance Use Topics   Alcohol use: Yes    Comment: occasional   Drug use: No     Allergies   Patient has no known allergies.   Review of Systems Review of Systems  Constitutional:  Negative for activity change, appetite change, fatigue and fever.  Eyes:  Positive for visual disturbance. Negative for photophobia.  Respiratory:  Negative for cough and shortness of breath.   Cardiovascular:  Negative for chest pain.  Gastrointestinal:  Positive for diarrhea. Negative for abdominal pain, nausea and vomiting.  Musculoskeletal:  Negative for arthralgias and myalgias.  Neurological:  Positive for headaches. Negative for dizziness, syncope, facial asymmetry, speech difficulty, weakness, light-headedness and numbness.    Physical Exam Triage Vital Signs ED Triage Vitals  Enc Vitals Group     BP 12/04/20 1806 (!) 174/103     Pulse Rate 12/04/20 1806 92     Resp 12/04/20 1806 19     Temp 12/04/20 1806 98.4 F (36.9 C)     Temp src --      SpO2 12/04/20 1806 97 %     Weight --      Height --      Head Circumference --      Peak Flow --      Pain Score 12/04/20 1805 5     Pain Loc --      Pain Edu? --      Excl. in [place] --    No data found.  Updated Vital Signs BP (!) 170/98   Pulse 85   Temp 98.4 F (36.9 C)   Resp 19   SpO2 97%   Visual Acuity Right Eye Distance: 20/40 Left Eye Distance: 20/30 Bilateral Distance: 20/30  Right Eye Near:   Left Eye Near:    Bilateral Near:     Physical Exam Vitals reviewed.  Constitutional:      General: She is awake. She is not in acute distress.    Appearance: Normal appearance. She is normal weight. She is not ill-appearing.     Comments: Very pleasant female appears  stated age in no acute distress sitting comfortably in exam room  HENT:     Head: Normocephalic and atraumatic. No raccoon eyes, Battle's sign or contusion.     Right Ear: Tympanic membrane, ear canal and external ear normal.  No hemotympanum.     Left Ear: Tympanic membrane, ear canal and external ear normal. No hemotympanum.     Nose: Nose normal.     Mouth/Throat:     Tongue: Tongue does not deviate from midline.     Pharynx: Uvula midline. No oropharyngeal exudate or posterior oropharyngeal erythema.  Eyes:     Extraocular Movements: Extraocular movements intact.     Conjunctiva/sclera: Conjunctivae normal.     Pupils: Pupils are equal, round, and reactive to light.     Funduscopic exam:    Right eye: No hemorrhage.        Left eye: No hemorrhage.  Cardiovascular:     Rate and Rhythm: Normal rate and regular rhythm.     Heart sounds: Normal heart sounds, S1 normal and S2 normal. No murmur heard. Pulmonary:     Effort: Pulmonary effort is normal.     Breath sounds: Normal breath sounds. No wheezing, rhonchi or rales.     Comments: Clear to auscultation bilaterally Musculoskeletal:     Cervical back: No spinous process tenderness or muscular tenderness.     Comments: Strength 5/5 bilateral upper and lower extremities.  Lymphadenopathy:     Head:     Right side of head: No submental, submandibular or tonsillar adenopathy.     Left side of head: No submental, submandibular or tonsillar adenopathy.  Neurological:     General: No focal deficit present.     Cranial Nerves: Cranial nerves are intact.     Motor: Motor function is intact.     Coordination: Coordination is intact.     Gait: Gait is intact.     Comments: Cranial nerves II through XII intact.  No focal neurological defect on exam.  Psychiatric:        Behavior: Behavior is cooperative.     UC Treatments / Results  Labs (all labs ordered are listed, but only abnormal results are displayed) Labs Reviewed - No data to  display  EKG   Radiology No results found.  Procedures Procedures (including critical care time)  Medications Ordered in UC Medications - No data to display  Initial Impression / Assessment and Plan / UC Course  I have reviewed the triage vital signs and the nursing notes.  Pertinent labs & imaging results that were available during my care of the patient were reviewed by me and considered in my medical decision making (see chart for details).    Discussed with patient that the safest thing to do would be to go to the emergency room given elevated blood pressure reading with headache and blurred vision.  Patient declined to do this as her symptoms have been improving and she did not want to wait in the emergency room.  We will increase amlodipine to 10 mg daily and encouraged her to take a full tablet of olmesartan/hydrochlorothiazide as previously prescribed.  Recommended she monitor her blood pressure closely and if her symptoms do not quickly resolve with improvement of blood pressure she needs to go to the emergency room.  If her blood pressure remains above 140/90 she is to be reevaluated.  Recommended she avoid NSAIDs, caffeine, decongestants, sodium.  Patient is agreeable to go to the emergency room if symptoms not significantly improved within a few hours of blood pressure medication changes and improvement.  Strict return precautions given to which patient expressed understanding.  Final Clinical Impressions(s) / UC Diagnoses   Final diagnoses:  Elevated blood pressure reading in office with diagnosis  of hypertension  Tension headache  Vision changes     Discharge Instructions      I think the safest thing to do is to go to the emergency room given your symptoms.  Since you do not want to do that, I would recommend that we increase amlodipine to 10 mg daily and restart your higher dose of blood pressure medication.  Please avoid NSAIDs (such as aspirin, ibuprofen/Advil,  naproxen/Aleve), caffeine, decongestant.  If you continue to have headache, vision changes, elevated blood pressure you absolutely need to go to the emergency room.  Please monitor your blood pressure very closely and if this is not improving to under 140/90 with medication changes or if you continue to have symptoms you need to be seen immediately.     ED Prescriptions     Medication Sig Dispense Auth. Provider   amLODipine (NORVASC) 10 MG tablet  (Status: Discontinued) Take 1 tablet (10 mg total) by mouth daily. 30 tablet Kamran Coker K, PA-C   amLODipine (NORVASC) 10 MG tablet Take 1 tablet (10 mg total) by mouth daily. 30 tablet Dartagnan Beavers, Derry Skill, PA-C      PDMP not reviewed this encounter.   Terrilee Croak, PA-C 12/04/20 1921

## 2020-12-04 NOTE — Patient Instructions (Addendum)
Your blood pressure is high today.  Please see your primary care provider soon, to have it rechecked.  Please call there is you need any refills.   check your blood sugar 4 times a day.  vary the time of day when you check, between before the 3 meals, and at bedtime.  also check if you have symptoms of your blood sugar being too high or too low.  please keep a record of the readings and bring it to your next appointment here (or you can bring the meter itself).  You can write it on any piece of paper.  please call us sooner if your blood sugar goes below 70, or if you have a lot of readings over 200.   please increase the Tresiba to 30 units per day.    Please come back for a follow-up appointment in 3 months.

## 2020-12-04 NOTE — Discharge Instructions (Addendum)
I think the safest thing to do is to go to the emergency room given your symptoms.  Since you do not want to do that, I would recommend that we increase amlodipine to 10 mg daily and restart your higher dose of blood pressure medication.  Please avoid NSAIDs (such as aspirin, ibuprofen/Advil, naproxen/Aleve), caffeine, decongestant.  If you continue to have headache, vision changes, elevated blood pressure you absolutely need to go to the emergency room.  Please monitor your blood pressure very closely and if this is not improving to under 140/90 with medication changes or if you continue to have symptoms you need to be seen immediately.

## 2020-12-04 NOTE — Progress Notes (Signed)
Subjective:    Patient ID: [name], female    DOB: December 13, 1965, 55 y.o.   MRN: [id number]  HPI Pt returns for f/u of diabetes mellitus:   DM type: Insulin-requiring type 2.   Dx'ed: 1995, during a pregnancy Complications:  Therapy: insulin since 2021, and 2 oral meds.  DKA: never Severe hypoglycemia: never Pancreatitis: never Pancreatic imaging: never SDOH: she seldom checks cbg's; She stopped Trulicity, because she felt she is on too much medicine.   Other: she is not a candidate for multiple daily injections, due to noncompliance.   Interval history: Pt says she misses the insulin approx once per week, but she does not miss the [place].  no cbg record, but states cbg's vary from 187-215.   Pt says she takes BP rx intermittently.   Past Medical History:  Diagnosis Date   Anemia    CHF (congestive heart failure) (HCC)    Diabetes mellitus    Heart murmur    Hypertension    Pneumonia     Past Surgical History:  Procedure Laterality Date   CESAREAN SECTION     TUBAL LIGATION      Social History   Socioeconomic History   Marital status: Married    Spouse name: Not on file   Number of children: Not on file   Years of education: Not on file   Highest education level: Not on file  Occupational History   Occupation: unemployed  Tobacco Use   Smoking status: Never   Smokeless tobacco: Never  Substance and Sexual Activity   Alcohol use: Yes    Comment: occasional   Drug use: No   Sexual activity: Yes    Birth control/protection: None  Other Topics Concern   Not on file  Social History Narrative   Lives with her 4 children.   Social Determinants of Health   Financial Resource Strain: Not on file  Food Insecurity: Not on file  Transportation Needs: Not on file  Physical Activity: Not on file  Stress: Not on file  Social Connections: Not on file  Intimate Partner Violence: Not on file    Current Outpatient Medications on File Prior to Visit  Medication  Sig Dispense Refill   buPROPion (WELLBUTRIN SR) 100 MG 12 hr tablet Take 1 tablet (100 mg total) by mouth daily. 30 tablet 1   clotrimazole-betamethasone (LOTRISONE) cream Apply 1 application topically 2 (two) times daily. 45 g 3   escitalopram (LEXAPRO) 10 MG tablet Take 1.5 tablets (15 mg total) by mouth daily. Delete prior refills 45 tablet 1   etodolac (LODINE) 400 MG tablet Take 400 mg by mouth 2 (two) times daily.     ferrous sulfate (FEROSUL) 325 (65 FE) MG tablet TAKE 1 TABLET(325 MG) BY MOUTH TWICE DAILY WITH A MEAL 180 tablet 2   metoprolol tartrate (LOPRESSOR) 25 MG tablet Take 25 mg by mouth 2 (two) times daily.     naproxen (NAPROSYN) 500 MG tablet Take 1 tablet (500 mg total) by mouth 2 (two) times daily. 30 tablet 0   NON FORMULARY [facility]  ANTIFUNGAL (NAIL)- #1     SitaGLIPtin-MetFORMIN HCl (JANUMET XR) 50-1000 MG TB24 Take 2 tablets by mouth daily. 180 tablet 3   No current facility-administered medications on file prior to visit.    No Known Allergies  Family History  Problem Relation Age of Onset   Hypertension Mother    Hypertension Brother    Diabetes Neg Hx  BP (!) 190/110 (BP Location: Right Arm, Patient Position: Sitting, Cuff Size: Normal)   Pulse (!) 112   Ht '5\' 1"'$  (1.549 m)   Wt 223 lb 12.8 oz (101.5 kg)   SpO2 97%   BMI 42.29 kg/m    Review of Systems     Objective:   Physical Exam Pulses: dorsalis pedis intact bilat.   MSK: no deformity of the feet CV: 1+ bilat leg edema Skin:  no ulcer on the feet.  normal color and temp on the feet. Neuro: sensation is intact to touch on the feet.    A1c=9.2%     Assessment & Plan:  Insulin-requiring type 2 DM: uncontrolled  Patient Instructions  Your blood pressure is high today.  Please see your primary care provider soon, to have it rechecked.  Please call there is you need any refills.   check your blood sugar 4 times a day.  vary the time of day when you check, between before the  3 meals, and at bedtime.  also check if you have symptoms of your blood sugar being too high or too low.  please keep a record of the readings and bring it to your next appointment here (or you can bring the meter itself).  You can write it on any piece of paper.  please call us sooner if your blood sugar goes below 70, or if you have a lot of readings over 200.   please increase the Tresiba to 30 units per day.    Please come back for a follow-up appointment in 3 months.

## 2020-12-04 NOTE — ED Triage Notes (Signed)
Pt presents with complaints of headache and blurry vision bilaterally x 2 days. She went to see her endocrinologist today and her blood pressure was elevated. Pt takes blood pressure medication and has been out of one of them x 2 months ago. Pt has been taking amlodipine and losartan-hctz. Pt is out of metoprolol.

## 2021-02-19 ENCOUNTER — Ambulatory Visit: Payer: BC Managed Care – PPO | Admitting: Endocrinology

## 2021-02-23 ENCOUNTER — Other Ambulatory Visit: Payer: Self-pay

## 2021-02-23 ENCOUNTER — Ambulatory Visit: Payer: Self-pay

## 2021-02-23 ENCOUNTER — Encounter: Payer: Self-pay | Admitting: Orthopaedic Surgery

## 2021-02-23 ENCOUNTER — Ambulatory Visit (INDEPENDENT_AMBULATORY_CARE_PROVIDER_SITE_OTHER): Payer: BC Managed Care – PPO | Admitting: Orthopaedic Surgery

## 2021-02-23 VITALS — BP 143/78 | HR 69 | Ht 64.0 in | Wt 220.0 lb

## 2021-02-23 DIAGNOSIS — M25561 Pain in right knee: Secondary | ICD-10-CM | POA: Diagnosis not present

## 2021-02-23 DIAGNOSIS — G8929 Other chronic pain: Secondary | ICD-10-CM | POA: Diagnosis not present

## 2021-02-23 DIAGNOSIS — M545 Low back pain, unspecified: Secondary | ICD-10-CM | POA: Diagnosis not present

## 2021-02-23 NOTE — Progress Notes (Signed)
Office Visit Note   Patient: [name]           Date of Birth: Apr 04, 1966           MRN: [id number] Visit Date: 02/23/2021              Requested by: Leeroy Cha, MD [address] [place],  [place] [address] PCP: Leeroy Cha, MD   Assessment & Plan: Visit Diagnoses:  1. Chronic bilateral low back pain, unspecified whether sciatica present   2. Chronic pain of right knee     Plan: Patient had persistent symptoms with midportion patellar defect traumatic versus degenerative.  It involves about one third of the patellar surface.  Patient needs an MRI scan for evaluation due to falling history and persistent changes on radiographs.  Office follow-up after MRI scan of her knee.  Follow-Up Instructions: No follow-ups on file.   Orders:  Orders Placed This Encounter  Procedures   XR KNEE 3 VIEW RIGHT   XR Lumbar Spine 2-3 Views   MR Knee Right w/o contrast   No orders of the defined types were placed in this encounter.     Procedures: No procedures performed   Clinical Data: No additional findings.   Subjective: Chief Complaint  Patient presents with   Lower Back - Pain   Right Knee - Pain    HPI 55 year old female returns she states she fell this morning getting ready for work.  When she goes downstairs her knees have been giving way on her.  She is used Tylenol with some improvement.  She fell this past summer as well.  She has had some discomfort in her lower back and is concerned that may be contributing to the right leg weakness.  Knee buckles particularly when patellofemoral joint is loaded.  Sometimes she has to pick her leg up and put her socks on.  No numbness or tingling in her feet.  Review of Systems all other systems noncontributory to HPI.   Objective: Vital Signs: BP (!) 143/78   Pulse 69   Ht 5\' 4"  (1.626 m)   Wt 220 lb (99.8 kg)   BMI 37.76 kg/m   Physical Exam Constitutional:      Appearance: She is  well-developed.  HENT:     Head: Normocephalic.     Right Ear: External ear normal.     Left Ear: External ear normal. There is no impacted cerumen.  Eyes:     Pupils: Pupils are equal, round, and reactive to light.  Neck:     Thyroid: No thyromegaly.     Trachea: No tracheal deviation.  Cardiovascular:     Rate and Rhythm: Normal rate.  Pulmonary:     Effort: Pulmonary effort is normal.  Abdominal:     Palpations: Abdomen is soft.  Musculoskeletal:     Cervical back: No rigidity.  Skin:    General: Skin is warm and dry.  Neurological:     Mental Status: She is alert and oriented to person, place, and time.  Psychiatric:        Behavior: Behavior normal.    Ortho Exam patient has pain with patellofemoral loading and quadriceps contracture.  Negative patellar subluxation ACL PCL exam is normal.  Distal pulses are 2+.  Negative popliteal compression test negative logroll of the hips.  Knee and ankle jerk are intact.  Specialty Comments:  No specialty comments available.  Imaging: XR KNEE 3 VIEW RIGHT  Result Date:  02/26/2021 AP lateral and sunrise patella x-ray right knee obtained and reviewed.  This shows no significant arthritic changes in the medial lateral compartment.  Mid portion of the talus shows a lucency consistent with chondromalacia or possible chondral defect.  Stable changes from 01/30/2020 images. 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 Impression:  XR Lumbar Spine 2-3 Views  Result Date: 02/26/2021 AP lateral lumbar spine x-rays are obtained and reviewed.  This shows normal alignment maintained disc space height.  Negative for acute changes. Impression: Normal lumbar spine radiographs.    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  08/16/2016   Class 2 obesity with body mass index (BMI) of 37.0 to 37.9 in adult 04/22/2016   Noncompliance 03/12/2016   Excessive or frequent menstruation 02/08/2013   Leiomyoma of uterus, unspecified 02/08/2013   Hypertensive heart disease with heart failure ([facility]) 05/06/2012   CHF, acute ([place]) 05/06/2012   IDA (iron deficiency anemia) 11/18/2008   Type 2 diabetes mellitus with other specified complication (HCC)/HTN and CHF 11/17/2008   HYPERTENSION, BENIGN ESSENTIAL 11/17/2008   Past Medical History:  Diagnosis Date   Anemia    CHF (congestive heart failure) (HCC)    Diabetes mellitus    Heart murmur    Hypertension    Pneumonia     Family History  Problem Relation Age of Onset   Hypertension Mother    Hypertension Brother    Diabetes Neg Hx     Past Surgical History:  Procedure Laterality Date   CESAREAN SECTION     TUBAL LIGATION     Social History   Occupational History   Occupation: unemployed  Tobacco Use   Smoking status: Never   Smokeless tobacco: Never  Substance and Sexual Activity   Alcohol use: Yes    Comment: occasional   Drug use: No   Sexual activity: Yes    Birth control/protection: None

## 2021-03-19 ENCOUNTER — Ambulatory Visit: Payer: BC Managed Care – PPO | Admitting: Endocrinology

## 2021-03-20 ENCOUNTER — Ambulatory Visit
Admission: RE | Admit: 2021-03-20 | Discharge: 2021-03-20 | Disposition: A | Discharge status: Home / Self Care | Payer: BC Managed Care – PPO | Source: Ambulatory Visit | Attending: Orthopaedic Surgery | Admitting: Orthopaedic Surgery

## 2021-03-20 ENCOUNTER — Other Ambulatory Visit: Payer: Self-pay

## 2021-03-20 DIAGNOSIS — G8929 Other chronic pain: Secondary | ICD-10-CM

## 2021-04-24 ENCOUNTER — Ambulatory Visit (INDEPENDENT_AMBULATORY_CARE_PROVIDER_SITE_OTHER): Payer: BC Managed Care – PPO | Admitting: Orthopaedic Surgery

## 2021-04-24 ENCOUNTER — Other Ambulatory Visit: Payer: Self-pay

## 2021-04-24 ENCOUNTER — Encounter: Payer: Self-pay | Admitting: Orthopaedic Surgery

## 2021-04-24 VITALS — Ht 64.0 in | Wt 220.0 lb

## 2021-04-24 DIAGNOSIS — M1711 Unilateral primary osteoarthritis, right knee: Secondary | ICD-10-CM

## 2021-04-24 NOTE — Progress Notes (Signed)
Office Visit Note   Patient: [name]           Date of Birth: [birth year]           MRN: [id number] Visit Date: 04/24/2021              Requested by: Leeroy Cha, MD [address] [place],  [place] [address] PCP: Leeroy Cha, MD   Assessment & Plan: Visit Diagnoses:  1. Unilateral primary osteoarthritis, right knee     Plan: MRI scan shows complex tear of the posterior aspect of the medial meniscus and also partial and full-thickness areas of cartilage loss over the patella.  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  We discussed treatment options.  She does have meniscal tear but significant problem is patellofemoral arthritis and her compliance with her diabetes is not been good with over the last 2 years all A1c's are ranging between 9.2 and 10.4.  She understands to be considered possibly candidate for knee arthroplasty she needs to have her diabetes better with A1c less than 7.5  She can return for further discussion once she achieves this.  Follow-Up Instructions: No follow-ups on file.   Orders:  No orders of the defined types were placed in this encounter.  No orders of the defined types were placed in this encounter.     Procedures: No procedures performed   Clinical Data: No additional findings.   Subjective: Chief Complaint  Patient presents with   Right Knee - Pain, Follow-up    MRI review right knee    HPI 55 year old female with 56-month follow-up with ongoing problems with right knee pain.  MRI scan has been of obtained and is available for review.  She states her knee hurts when she tries to climb stairs.  Difficult to walk long distances.  At times her knee tends to buckle and she states she has fallen.  She has to hang onto a cart when she goes in the grocery store.  She used ibuprofen for the pain.  No bowel bladder symptoms she denies fever  or chills.  Patient states at times that her knee is where she has to use a cane to ambulate.  Patient has hypertension also problems with heart failure in the past.  Type 2 diabetes history of noncompliance.  BMI 37.  She relates particular problems with steps or if she squats or gets down on the floor.  Review of Systems all other systems noncontributory to HPI.   Objective: Vital Signs: Ht 5\' 4"  (1.626 m)    Wt 220 lb (99.8 kg)    BMI 37.76 kg/m   Physical Exam Constitutional:      Appearance: She is well-developed.  HENT:     Head: Normocephalic.     Right Ear: External ear normal.     Left Ear: External ear normal. There is no impacted cerumen.  Eyes:     Pupils: Pupils are equal, round, and reactive to light.  Neck:     Thyroid: No thyromegaly.     Trachea: No tracheal deviation.  Cardiovascular:     Rate and Rhythm: Normal rate.  Pulmonary:     Effort: Pulmonary effort is normal.  Abdominal:     Palpations: Abdomen is soft.  Musculoskeletal:     Cervical back: No rigidity.  Skin:    General: Skin is warm and dry.  Neurological:     Mental Status:  She is alert and oriented to person, place, and time.  Psychiatric:        Behavior: Behavior normal.    Ortho Exam patient has pain with patellofemoral loading quadriceps contracture.  ACL PCL exam is normal.  Collateral ligaments are stable.  Negative logroll to the hips.  Knee and ankle jerk are intact.  Specialty Comments:  No specialty comments available.  Imaging: Narrative & Impression  CLINICAL DATA:  Right knee patellar chondral defect. Right anterior/posterior knee pain with tightness for 1 year. No known injury. Patient denies history of therapeutic injections or surgeries.   EXAM: MRI OF THE RIGHT KNEE WITHOUT CONTRAST   TECHNIQUE: Multiplanar, multisequence MR imaging of the knee was performed. No intravenous contrast was administered.   COMPARISON:  X-ray knee 02/26/2021.   FINDINGS: MENISCI    Medial meniscus: Complex tear of the posterior root with mild extrusion of the body.   Lateral meniscus: Intact.   LIGAMENTS   Cruciates: Intact ACL and PCL.   Collaterals: Medial collateral ligament is intact. Lateral collateral ligament complex is intact.   CARTILAGE   Patellofemoral: High-grade partial and full-thickness cartilage loss over the patellar apex and lateral facet with underlying subchondral marrow edema and cystic change. Trochlear cartilage is preserved.   Medial:  Mild diffuse cartilage thinning.  No focal defect.   Lateral:  Normal.   Joint: Trace joint effusion. Edema within the superolateral aspect of Hoffa's fat.   Popliteal Fossa:  Small Baker cyst.  Intact popliteus tendon.   Extensor Mechanism: Intact quadriceps tendon and patellofemoral tendon.   Bones: No focal marrow signal abnormality. No fracture or dislocation.   Other: None.   IMPRESSION: 1. Complex tear of the medial meniscus posterior root with mild extrusion of the body. 2. High-grade partial and full-thickness cartilage loss over the patella. 3. Edema within the superolateral aspect of Hoffa's fat pad, which can be seen in the setting of patellar tendon-lateral femoral condyle friction syndrome.     Electronically Signed   By: Titus Dubin M.D.   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 08/16/2016   Class 2 obesity with body mass index (BMI) of 37.0 to 37.9 in adult 04/22/2016   Noncompliance 03/12/2016   Excessive or frequent menstruation 02/08/2013   Leiomyoma of uterus, unspecified 02/08/2013   Hypertensive heart disease with heart failure ([place]) 05/06/2012   CHF, acute ([facility]) 05/06/2012   IDA (iron deficiency anemia) 11/18/2008   Type 2 diabetes mellitus with  other specified complication (HCC)/HTN and CHF 11/17/2008   HYPERTENSION, BENIGN ESSENTIAL 11/17/2008   Past Medical History:  Diagnosis Date   Anemia    CHF (congestive heart failure) (HCC)    Diabetes mellitus    Heart murmur    Hypertension    Pneumonia     Family History  Problem Relation Age of Onset   Hypertension Mother    Hypertension Brother    Diabetes Neg Hx     Past Surgical History:  Procedure Laterality Date   CESAREAN SECTION     TUBAL LIGATION     Social History   Occupational History   Occupation: unemployed  Tobacco Use   Smoking status: Never   Smokeless tobacco: Never  Substance and Sexual Activity   Alcohol use:  Yes    Comment: occasional   Drug use: No   Sexual activity: Yes    Birth control/protection: None

## 2021-09-05 ENCOUNTER — Emergency Department (HOSPITAL_COMMUNITY): Payer: BC Managed Care – PPO

## 2021-09-05 ENCOUNTER — Emergency Department (HOSPITAL_COMMUNITY)
Admission: EM | Admit: 2021-09-05 | Discharge: 2021-09-06 | Disposition: A | Discharge status: Home / Self Care | Payer: BC Managed Care – PPO | Attending: Student | Admitting: Student

## 2021-09-05 ENCOUNTER — Encounter (HOSPITAL_COMMUNITY): Payer: Self-pay

## 2021-09-05 ENCOUNTER — Other Ambulatory Visit: Payer: Self-pay

## 2021-09-05 DIAGNOSIS — M545 Low back pain, unspecified: Secondary | ICD-10-CM | POA: Insufficient documentation

## 2021-09-05 DIAGNOSIS — Z794 Long term (current) use of insulin: Secondary | ICD-10-CM | POA: Insufficient documentation

## 2021-09-05 DIAGNOSIS — M542 Cervicalgia: Secondary | ICD-10-CM | POA: Insufficient documentation

## 2021-09-05 DIAGNOSIS — Y92410 Unspecified street and highway as the place of occurrence of the external cause: Secondary | ICD-10-CM | POA: Insufficient documentation

## 2021-09-05 MED ORDER — KETOROLAC TROMETHAMINE 15 MG/ML IJ SOLN
30.0000 mg | Freq: Once | INTRAMUSCULAR | Status: AC
  Administered 2021-09-05: 30 mg via INTRAMUSCULAR
  Filled 2021-09-05: qty 2

## 2021-09-05 MED ORDER — KETOROLAC TROMETHAMINE 15 MG/ML IJ SOLN
INTRAMUSCULAR | Status: AC
  Administered 2021-09-05: 15 mg via INTRAMUSCULAR
  Filled 2021-09-05: qty 1

## 2021-09-05 MED ORDER — CYCLOBENZAPRINE HCL 10 MG PO TABS: 10.0000 mg | ORAL_TABLET | Freq: Two times a day (BID) | ORAL | 0 refills | Status: DC | PRN

## 2021-09-05 MED ORDER — CYCLOBENZAPRINE HCL 10 MG PO TABS
5.0000 mg | ORAL_TABLET | Freq: Once | ORAL | Status: AC
  Administered 2021-09-05: 5 mg via ORAL
  Filled 2021-09-05: qty 1
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
## 2021-09-18 ENCOUNTER — Ambulatory Visit (INDEPENDENT_AMBULATORY_CARE_PROVIDER_SITE_OTHER): Payer: BC Managed Care – PPO | Admitting: Student

## 2021-09-18 VITALS — BP 145/74 | HR 89 | Wt 216.4 lb

## 2021-09-18 DIAGNOSIS — Z0000 Encounter for general adult medical examination without abnormal findings: Secondary | ICD-10-CM

## 2021-09-18 DIAGNOSIS — N939 Abnormal uterine and vaginal bleeding, unspecified: Secondary | ICD-10-CM

## 2021-09-18 DIAGNOSIS — Z6837 Body mass index (BMI) 37.0-37.9, adult: Secondary | ICD-10-CM

## 2021-09-18 DIAGNOSIS — I10 Essential (primary) hypertension: Secondary | ICD-10-CM

## 2021-09-18 DIAGNOSIS — D500 Iron deficiency anemia secondary to blood loss (chronic): Secondary | ICD-10-CM

## 2021-09-18 DIAGNOSIS — Z1211 Encounter for screening for malignant neoplasm of colon: Secondary | ICD-10-CM

## 2021-09-18 DIAGNOSIS — E1169 Type 2 diabetes mellitus with other specified complication: Secondary | ICD-10-CM

## 2021-09-18 DIAGNOSIS — Z794 Long term (current) use of insulin: Secondary | ICD-10-CM

## 2021-09-18 LAB — POCT GLYCOSYLATED HEMOGLOBIN (HGB A1C): Hemoglobin A1C: 9.8 % — AB (ref 4.0–5.6)

## 2021-09-18 LAB — GLUCOSE, CAPILLARY: Glucose-Capillary: 247 mg/dL — ABNORMAL HIGH (ref 70–99)

## 2021-09-18 MED ORDER — MOUNJARO 2.5 MG/0.5ML ~~LOC~~ SOAJ: 2.5000 mg | SUBCUTANEOUS | 3 refills | Status: DC

## 2021-09-18 MED ORDER — GLUCOSE BLOOD VI STRP: ORAL_STRIP | 12 refills | Status: DC

## 2021-09-18 MED ORDER — SYNJARDY XR 5-1000 MG PO TB24: 2.0000 | ORAL_TABLET | Freq: Every day | ORAL | 3 refills | Status: DC
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
## 2021-09-18 NOTE — Patient Instructions (Addendum)
It was a pleasure meeting you in clinic. Today we discussed:  ? ?Diabetes ?Please stop [place] ?Start synjardy 5-'1000mg'$  once daily ?Start Mounjaro 2.5 mg weekly, If you insurance does not cover this you can get a manufacturer copay card online to help with this ?I will refer you to our diabetes educator who can help with diet changes and CGM monitoring  ? ?High blood pressure ?Please continue amlodipine 10 mg daily and olmesartan-hctz 40-25 mg daily ? ?Shoulder pain ?I will place a referral to PT ? ?Uterine bleeding ?I will refer you to OB/GYN ?Please follow up in 2 weeks for a pap smear ? ?I will call you with your lab results  ? ?Follow up in 2 weeks for diabetes follow up and pap smear  ? ?If you have any questions or concerns, please call our clinic at [phone] between 9am-5pm and after hours call [phone] and ask for the internal medicine resident on call. If you feel you are having a medical emergency please call 911.  ? ?Thank you, we look forward to helping you remain healthy! ? ?Dr. Delma Post and Tyler Aas ?

## 2021-09-19 ENCOUNTER — Other Ambulatory Visit: Payer: Self-pay | Admitting: *Deleted

## 2021-09-19 LAB — BMP8+ANION GAP
Anion Gap: 18 mmol/L (ref 10.0–18.0)
BUN/Creatinine Ratio: 24 — ABNORMAL HIGH (ref 9–23)
BUN: 19 mg/dL (ref 6–24)
CO2: 24 mmol/L (ref 20–29)
Calcium: 9.7 mg/dL (ref 8.7–10.2)
Chloride: 97 mmol/L (ref 96–106)
Creatinine, Ser: 0.78 mg/dL (ref 0.57–1.00)
Glucose: 276 mg/dL — ABNORMAL HIGH (ref 70–99)
Potassium: 3.9 mmol/L (ref 3.5–5.2)
Sodium: 139 mmol/L (ref 134–144)
eGFR: 90 mL/min/{1.73_m2} (ref >59)

## 2021-09-19 LAB — CBC
Hematocrit: 38.5 % (ref 34.0–46.6)
Hemoglobin: 12 g/dL (ref 11.1–15.9)
MCH: 24.1 pg — ABNORMAL LOW (ref 26.6–33.0)
MCHC: 31.2 g/dL — ABNORMAL LOW (ref 31.5–35.7)
MCV: 78 fL — ABNORMAL LOW (ref 79–97)
Platelets: 372 10*3/uL (ref 150–450)
RBC: 4.97 x10E6/uL (ref 3.77–5.28)
RDW: 14.1 % (ref 11.7–15.4)
WBC: 8.6 10*3/uL (ref 3.4–10.8)

## 2021-09-19 MED ORDER — FERROUS SULFATE 325 (65 FE) MG PO TABS: ORAL_TABLET | ORAL | 2 refills | Status: DC

## 2021-09-19 MED ORDER — GLUCOSE BLOOD VI STRP
1.0000 | ORAL_STRIP | Freq: Every day | 12 refills | Status: AC
Start: 2021-09-19 — End: ?

## 2021-09-20 ENCOUNTER — Ambulatory Visit (INDEPENDENT_AMBULATORY_CARE_PROVIDER_SITE_OTHER): Payer: BC Managed Care – PPO | Admitting: Surgery

## 2021-09-20 ENCOUNTER — Encounter: Payer: Self-pay | Admitting: Student

## 2021-09-20 ENCOUNTER — Encounter: Payer: Self-pay | Admitting: Surgery

## 2021-09-20 ENCOUNTER — Ambulatory Visit: Payer: Self-pay

## 2021-09-20 ENCOUNTER — Telehealth: Payer: Self-pay

## 2021-09-20 VITALS — BP 138/84 | HR 90 | Ht 64.0 in | Wt 216.0 lb

## 2021-09-20 DIAGNOSIS — S161XXA Strain of muscle, fascia and tendon at neck level, initial encounter: Secondary | ICD-10-CM

## 2021-09-20 DIAGNOSIS — M25511 Pain in right shoulder: Secondary | ICD-10-CM

## 2021-09-20 MED ORDER — METHOCARBAMOL 500 MG PO TABS: 500.0000 mg | ORAL_TABLET | Freq: Two times a day (BID) | ORAL | 0 refills | Status: DC | PRN

## 2021-09-20 MED ORDER — IBUPROFEN 800 MG PO TABS: 800.0000 mg | ORAL_TABLET | Freq: Two times a day (BID) | ORAL | 0 refills | Status: DC | PRN

## 2021-09-20 NOTE — Assessment & Plan Note (Signed)
Discussed diet and exercise changes she can make.  Also strongly 2.5 mg daily as an addition to obesity ?

## 2021-09-20 NOTE — Telephone Encounter (Signed)
PA  for pt [facility] )   came through on cover my meds was submitted with office notes an lab ... awaiting approval or denial  ? ? ? UPDATE : ? ? ? ?Your information has been submitted to [facility]. To check for an updated outcome later, reopen this PA request from your dashboard. ? ?If [employer] has not responded to your request within 24 hours, ?
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
## 2021-09-20 NOTE — Assessment & Plan Note (Addendum)
History of uterine fibroids.  Reports daily vaginal spotting for the last 2 months.  She previously followed with OB but has not seen recently.  Reports prior to this periods have been getting lighter but still has monthly vaginal bleeding.  Given her significant bleeding we will have her follow-up with OB.  Transvaginal ultrasound to evaluate abnormal uterine bleeding further.  Will check CBC today.  Will place referral to OB.    CBC with normal hgb improved from prior with slight microcytosis, recommend iron studies at next visit.
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
## 2021-09-20 NOTE — Assessment & Plan Note (Addendum)
She is also due to PAP smear.  Last Pap smear in 2018 with negative cytology and HPV negative.  She will schedule appointment to have this done. ? ?Referral for colonoscopy ordered ? ?Due for mammogram in October.  Last mammogram in was normal. ?

## 2021-09-20 NOTE — Progress Notes (Signed)
Internal Medicine Clinic Attending ? ?Case discussed with Dr. Liang  At the time of the visit.  We reviewed the resident?s history and exam and pertinent patient test results.  I agree with the assessment, diagnosis, and plan of care documented in the resident?s note. ? ?

## 2021-09-20 NOTE — Assessment & Plan Note (Addendum)
Today her A1c is 9.8 today.  This is elevated from prior reading of 9.2%.  Reports she has had difficulty controlling her blood sugars lately.  She is on [place].  She does take her Januvia daily however only uses [place] when she feels her sugars are elevated.  She also only uses 15 units rather than 30 units as prescribed for fear of hypoglycemia.  She has had 1 episode in the past where glucose was 60s and felt very shaky.  She also endorses a dislike of using injection medications and would like to minimize this as much as possible.   She reports issues with metformin with diarrhea in the past however she has been tolerating Januvia without diarrhea.  Takes this once a day has difficulty remembering to take second evening dose.  Given she has not been taking her insulin we will try her on a SGLT2 as well as Mounjaro for better diabetic control as well as weight loss benefit.  Emphasized importance of regularly taking her medications.  Patient expressed that she would much rather do well with once weekly injection and then daily insulin.  She also reports she was given a CGM monitor in the past but was unable to figure out how to use this.  She would like to try this again to get better control of her diabetes.  I think she would also benefit from nutrition counseling.  We will place referral to diabetes educator for this.  Start Synjardy XR 02-1999 mg daily Start Mounjaro 2.5 mg daily and titrate up if tolerated Stop Januvia and Tresiba Referral to diabetes educator for CGM monitoring Follow up in 2 weeks  Urine microalbuminuria at next visit

## 2021-09-21 NOTE — Telephone Encounter (Signed)
DECISION : ? ?DENIED ? ?Coverage for this medication is denied for the following reason(s). We reviewed the ?information we received about your condition and circumstances. We used plan ?approved criteria when making this decision. The policy states that this medication may ?be approved when: ?-The member is unable to take the required number of formulary alternatives for the ?given diagnosis due to an intolerance or contraindication OR ?-The member has tried and failed the required number of formulary alternatives. ?Based on the policy and the information we received your request is denied. We did ?not receive documentation that you meet the criteria outlined above. ?Formulary alternatives are: Ozempic, Rybelsus, Trulicity, Victoza. ?

## 2021-09-25 ENCOUNTER — Encounter: Payer: Self-pay | Admitting: Orthopaedic Surgery

## 2021-09-25 ENCOUNTER — Ambulatory Visit: Payer: Self-pay

## 2021-09-25 ENCOUNTER — Ambulatory Visit (INDEPENDENT_AMBULATORY_CARE_PROVIDER_SITE_OTHER): Payer: BC Managed Care – PPO | Admitting: Orthopaedic Surgery

## 2021-09-25 VITALS — BP 139/81 | HR 97 | Ht 64.0 in | Wt 216.0 lb

## 2021-09-25 DIAGNOSIS — G8929 Other chronic pain: Secondary | ICD-10-CM

## 2021-09-25 DIAGNOSIS — M25561 Pain in right knee: Secondary | ICD-10-CM | POA: Diagnosis not present

## 2021-09-25 DIAGNOSIS — M25562 Pain in left knee: Secondary | ICD-10-CM
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
## 2021-10-01 ENCOUNTER — Ambulatory Visit (INDEPENDENT_AMBULATORY_CARE_PROVIDER_SITE_OTHER): Payer: BC Managed Care – PPO | Admitting: Student

## 2021-10-01 ENCOUNTER — Other Ambulatory Visit (HOSPITAL_COMMUNITY)
Admission: RE | Admit: 2021-10-01 | Discharge: 2021-10-01 | Disposition: A | Discharge status: Home / Self Care | Payer: BC Managed Care – PPO | Source: Ambulatory Visit | Attending: Internal Medicine | Admitting: Internal Medicine

## 2021-10-01 VITALS — BP 156/86 | HR 85 | Temp 98.4°F | Ht 64.0 in | Wt 215.4 lb

## 2021-10-01 DIAGNOSIS — Z114 Encounter for screening for human immunodeficiency virus [HIV]: Secondary | ICD-10-CM | POA: Diagnosis not present

## 2021-10-01 DIAGNOSIS — I110 Hypertensive heart disease with heart failure: Secondary | ICD-10-CM

## 2021-10-01 DIAGNOSIS — I10 Essential (primary) hypertension: Secondary | ICD-10-CM | POA: Diagnosis not present

## 2021-10-01 DIAGNOSIS — E785 Hyperlipidemia, unspecified: Secondary | ICD-10-CM

## 2021-10-01 DIAGNOSIS — Z124 Encounter for screening for malignant neoplasm of cervix: Secondary | ICD-10-CM

## 2021-10-01 DIAGNOSIS — E1169 Type 2 diabetes mellitus with other specified complication: Secondary | ICD-10-CM | POA: Diagnosis not present

## 2021-10-01 DIAGNOSIS — N939 Abnormal uterine and vaginal bleeding, unspecified: Secondary | ICD-10-CM

## 2021-10-01 DIAGNOSIS — Z0000 Encounter for general adult medical examination without abnormal findings: Secondary | ICD-10-CM

## 2021-10-01 DIAGNOSIS — Z1159 Encounter for screening for other viral diseases: Secondary | ICD-10-CM

## 2021-10-01 MED ORDER — OZEMPIC (0.25 OR 0.5 MG/DOSE) 2 MG/3ML ~~LOC~~ SOPN: 0.2500 mg | PEN_INJECTOR | SUBCUTANEOUS | 2 refills | Status: DC

## 2021-10-01 MED ORDER — OLMESARTAN MEDOXOMIL-HCTZ 40-25 MG PO TABS: 1.0000 | ORAL_TABLET | Freq: Every day | ORAL | 0 refills | Status: DC

## 2021-10-01 MED ORDER — AMLODIPINE BESYLATE 10 MG PO TABS: 10.0000 mg | ORAL_TABLET | Freq: Every day | ORAL | 0 refills | Status: DC

## 2021-10-01 NOTE — Patient Instructions (Addendum)
It was a pleasure seeing you today. We discussed:  Diabetes Please start synjardy 5-'1000mg'$  2 tablets daily Please start Ozempic 0.25 mg weekly, if tolerating this increase to 0.5 mg weekly Follow up with your eye doctor Follow up for diabetes in 1 month  Blood pressure I have refilled your medications Please take this daily and monitor you blood pressure at home and bring your log the next appointment  Abnormal bleeding We check a PAP smear today Please call to make an appointment with [facility] [place] [facility] in [place] Address: [address] [place], [place], [place] [address] Phone: [phone]  We will check labs today and I will call you with results   Take care, Dr. Lisabeth Devoid

## 2021-10-02 DIAGNOSIS — Z124 Encounter for screening for malignant neoplasm of cervix: Secondary | ICD-10-CM | POA: Insufficient documentation

## 2021-10-02 DIAGNOSIS — E785 Hyperlipidemia, unspecified: Secondary | ICD-10-CM | POA: Insufficient documentation

## 2021-10-02 LAB — LIPID PANEL
Chol/HDL Ratio: 3.8 ratio (ref 0.0–4.4)
Cholesterol, Total: 232 mg/dL — ABNORMAL HIGH (ref 100–199)
HDL: 61 mg/dL (ref >39)
LDL Chol Calc (NIH): 149 mg/dL — ABNORMAL HIGH (ref 0–99)
Triglycerides: 124 mg/dL (ref 0–149)
VLDL Cholesterol Cal: 22 mg/dL (ref 5–40)

## 2021-10-02 LAB — MICROALBUMIN / CREATININE URINE RATIO
Creatinine, Urine: 66.9 mg/dL
Microalb/Creat Ratio: 513 mg/g creat — ABNORMAL HIGH (ref 0–29)
Microalbumin, Urine: 343.4 ug/mL

## 2021-10-02 LAB — HCV INTERPRETATION

## 2021-10-02 LAB — HIV ANTIBODY (ROUTINE TESTING W REFLEX): HIV Screen 4th Generation wRfx: NONREACTIVE

## 2021-10-02 LAB — HCV AB W REFLEX TO QUANT PCR: HCV Ab: NONREACTIVE

## 2021-10-02 MED ORDER — OLMESARTAN MEDOXOMIL-HCTZ 40-25 MG PO TABS: 1.0000 | ORAL_TABLET | Freq: Every day | ORAL | 3 refills | Status: DC

## 2021-10-02 MED ORDER — AMLODIPINE BESYLATE 10 MG PO TABS: 10.0000 mg | ORAL_TABLET | Freq: Every day | ORAL | 3 refills | Status: DC

## 2021-10-02 NOTE — Assessment & Plan Note (Signed)
Continues to have daily spotting. PAP smear performed today. Scant amount of bloody discharge present. No obvious lesions. Provided contact information of OB follow up.
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
## 2021-10-02 NOTE — Assessment & Plan Note (Signed)
BP remains elevated to151/82and 156/86 on repeat. Patient has been out of hypertension medications. Does reports she took this today, but not in the past few days. Refilled her olmesartan-hydrocholothiazide 40-'25mg'$  daily and amlodipine 10 mg daily. Discussed importance of medication compliance. Provided blood pressure log. She has not gotten batteries for her cuff. Encouraged her to do so.  Continue amlodipine and olmesartan-HCTZ Follow up in 1 month Patient to bring bp log to next visit

## 2021-10-02 NOTE — Assessment & Plan Note (Addendum)
HIV and hepatitis C screen negative

## 2021-10-02 NOTE — Assessment & Plan Note (Signed)
PAP smear screening

## 2021-10-02 NOTE — Assessment & Plan Note (Signed)
Appears she was on rosuvastatin in the past. Lipid profile today.

## 2021-10-02 NOTE — Assessment & Plan Note (Addendum)
Patient was able to pick up synjardy but has not started this. Insurance denies [employer]. Will try to send in ozempic for weight loss benefit and weekly dosing to help with compliance. Encouraged her start synjardy.  Start synjardy Start Ozempic 0.25 mg weekly Check urine microalbumin today Patient will make follow up ophthalmology appointment with Dr. Katy Fitch Follow up in 1 month.

## 2021-10-03 LAB — CYTOLOGY - PAP
Adequacy: ABSENT
Comment: NEGATIVE
Diagnosis: NEGATIVE
High risk HPV: NEGATIVE

## 2021-10-03 NOTE — Progress Notes (Signed)
Internal Medicine Clinic Attending ? ?Case discussed with Dr. Liang  At the time of the visit.  We reviewed the resident?s history and exam and pertinent patient test results.  I agree with the assessment, diagnosis, and plan of care documented in the resident?s note. ? ?

## 2021-10-04 ENCOUNTER — Ambulatory Visit (INDEPENDENT_AMBULATORY_CARE_PROVIDER_SITE_OTHER): Payer: BC Managed Care – PPO | Admitting: Student

## 2021-10-04 VITALS — BP 128/84 | HR 106 | Temp 98.0°F

## 2021-10-04 DIAGNOSIS — E1169 Type 2 diabetes mellitus with other specified complication: Secondary | ICD-10-CM | POA: Diagnosis not present

## 2021-10-04 DIAGNOSIS — R300 Dysuria: Secondary | ICD-10-CM | POA: Diagnosis not present

## 2021-10-04 DIAGNOSIS — Z7985 Long-term (current) use of injectable non-insulin antidiabetic drugs: Secondary | ICD-10-CM

## 2021-10-04 NOTE — Patient Instructions (Addendum)
Painful and frequent urination  This may be a side effect of the synjardy We will check a urine analysis for signs of infection I expect this will improve with time  Diabetes Continue synjardy 1 tablet daily for 1 week if tolerating well increase to 1 tablet twice daily Start Ozempic 0.25 mg weekly   Cholesterol  Please start rosuvastatin 20 mg daily   Follow up for diabetes in 1 month

## 2021-10-05 LAB — URINALYSIS, ROUTINE W REFLEX MICROSCOPIC
Bilirubin, UA: NEGATIVE
Leukocytes,UA: NEGATIVE
Nitrite, UA: NEGATIVE
RBC, UA: NEGATIVE
Specific Gravity, UA: >=1.03 — AB (ref 1.005–1.030)
Urobilinogen, Ur: 0.2 mg/dL (ref 0.2–1.0)
pH, UA: 5 (ref 5.0–7.5)

## 2021-10-05 LAB — MICROSCOPIC EXAMINATION
Bacteria, UA: NONE SEEN
Casts: NONE SEEN /lpf
RBC, Urine: NONE SEEN /hpf (ref 0–2)

## 2021-10-06 LAB — URINE CULTURE

## 2021-10-09 ENCOUNTER — Telehealth: Payer: Self-pay

## 2021-10-09 DIAGNOSIS — R300 Dysuria: Secondary | ICD-10-CM | POA: Insufficient documentation

## 2021-10-09 NOTE — Assessment & Plan Note (Addendum)
Patient reports dysuria for 2 days after starting synjardy.  Reports she is also urinating more frequently.  No rashes in the groin. Suspect this may be irritation from [place]. UA and culture negative today. Will follow up as needed if symptoms persist.

## 2021-10-09 NOTE — Assessment & Plan Note (Addendum)
Patient reports she started [place] 2 days ago.  Reports she has had diarrhea yesterday but none today. No abdominal pain today. She also reports frequent urination.  Discussed that this likely due to SGLT2 inhibitor component of her Synjardy and increased urination is to be expected.  She endorses some dysuria as for the past 2 days which she also associates with starting the [place].  She is planning on picking up her Ozempic later this week.  She currently does not have any more abdominal pain or diarrhea.  On exam abdomen is benign.  I suspect her symptoms are due to her starting Synjardy.  She is only taking 1 tablet daily.   Continue syndardy 5-'1000mg'$  daily can increase to 2 tablets daily if tolerating in 1 week UA and urine culture today unremarkable Start Ozempic 0.25 mg weekly Follow-up A1c in 1 month

## 2021-10-09 NOTE — Telephone Encounter (Signed)
Pt called stated that she has stop taking her synjardy  because it was making her feel sick and that she has also stopped her Ozempic and went back to her old medication because she did not know how to do the dosing  for the medication ... pt was given appt  6/1 for med reaction and teaching of [place]  also told pt to bring the medication with her
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
## 2021-10-10 NOTE — Progress Notes (Signed)
Internal Medicine Clinic Attending ? ?Case discussed with Dr. Liang  At the time of the visit.  We reviewed the resident?s history and exam and pertinent patient test results.  I agree with the assessment, diagnosis, and plan of care documented in the resident?s note. ? ?

## 2021-10-11 ENCOUNTER — Encounter: Payer: Self-pay | Admitting: Student

## 2021-10-11 ENCOUNTER — Other Ambulatory Visit: Payer: Self-pay

## 2021-10-11 ENCOUNTER — Ambulatory Visit (INDEPENDENT_AMBULATORY_CARE_PROVIDER_SITE_OTHER): Payer: BC Managed Care – PPO | Admitting: Student

## 2021-10-11 VITALS — BP 137/81 | HR 107 | Temp 97.8°F | Ht 64.0 in | Wt 218.4 lb

## 2021-10-11 DIAGNOSIS — I10 Essential (primary) hypertension: Secondary | ICD-10-CM

## 2021-10-11 DIAGNOSIS — E1169 Type 2 diabetes mellitus with other specified complication: Secondary | ICD-10-CM | POA: Diagnosis not present

## 2021-10-11 MED ORDER — OZEMPIC (0.25 OR 0.5 MG/DOSE) 2 MG/3ML ~~LOC~~ SOPN: 0.2500 mg | PEN_INJECTOR | SUBCUTANEOUS | 2 refills | Status: AC

## 2021-10-11 MED ORDER — METFORMIN HCL ER 500 MG PO TB24: 1000.0000 mg | ORAL_TABLET | Freq: Every day | ORAL | 3 refills | Status: DC

## 2021-10-11 NOTE — Patient Instructions (Addendum)
Please stop taking synjardy and janumet. We will start metformin extended release (XR) at '1000mg'$  daily or two tablets in the morning only.   You will also continue your ozempic and continue to increase this dose every four weeks depending on how well you tolerate it up to '2mg'$  once weekly.

## 2021-10-12 NOTE — Assessment & Plan Note (Signed)
BP close to goal this clinic visit. 137/81. Will continue current antihypertensive regimen of amlodipine '10mg'$  daily and olmesartan-HCTz (40-'25mg'$ ) daily.

## 2021-10-12 NOTE — Assessment & Plan Note (Signed)
Patient reports that she stopped taking her Synjardy after some frequent urination and started taking Janumet.  She was recently started on Ozempic as well but has some questions with how to dose this medication.  This visit discussed with the patient that we could titrate her Ozempic for her diabetes and start her on metformin extended release 1 g daily.  She would not like to start another SGLT2 inhibitor as it made her urinate frequently.  Also discussed with patient that she should discontinue use of her Janumet.  Fortunately, our diabetes educator Butch Penny Pyler was available and did a significant amount of teaching and showed patient how to use her new injectable.  -Patient will follow up with Butch Penny in 1 month and with our clinic in 3 months.

## 2021-10-12 NOTE — Progress Notes (Signed)
   CC: F/u for T2DM  HPI:  [name] is a 56 y.o. F with PMH per below who presents for follow up of her T2DM. Please see problem based charting under encounters tab for further details.    Past Medical History:  Diagnosis Date   Anemia    CHF (congestive heart failure) (HCC)    Diabetes mellitus    Heart murmur    Hypertension    Pneumonia    Review of Systems:  Please see problem based charting under encounters tab for further details.   Physical Exam:  Vitals:   10/11/21 1421  BP: 137/81  Pulse: (!) 107  Temp: 97.8 F (36.6 C)  TempSrc: Oral  SpO2: 99%  Weight: 218 lb 6.4 oz (99.1 kg)  Height: '5\' 4"'$  (1.626 m)   Constitutional: Well-developed, well-nourished, and in no distress.  HENT:  Head: Normocephalic and atraumatic.  Eyes: EOM are normal.  Neck: Normal range of motion.  Cardiovascular: Normal rate, regular rhythm, intact distal pulses. No gallop and no friction rub.  No murmur heard. No lower extremity edema  Pulmonary: Non labored breathing on room air, no wheezing or rales  Abdominal: Soft. Normal bowel sounds. Non distended and non tender Musculoskeletal: Normal range of motion.        General: No tenderness or edema.  Neurological: Alert and oriented to person, place, and time. Non focal  Skin: Skin is warm and dry.    Assessment & Plan:   See Encounters Tab for problem based charting.  Patient discussed with Dr.  Saverio Danker
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
## 2021-10-15 ENCOUNTER — Ambulatory Visit: Payer: BC Managed Care – PPO | Admitting: Rehabilitative and Restorative Service Providers"

## 2021-10-15 NOTE — Addendum Note (Signed)
Addended by: Charise Killian on: 10/15/2021 02:48 PM   Modules accepted: Level of Service
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
## 2021-10-15 NOTE — Progress Notes (Signed)
Internal Medicine Clinic Attending  Case discussed with Dr. Carter  At the time of the visit.  We reviewed the resident's history and exam and pertinent patient test results.  I agree with the assessment, diagnosis, and plan of care documented in the resident's note.  

## 2021-10-16 ENCOUNTER — Ambulatory Visit: Payer: BC Managed Care – PPO | Attending: Surgery

## 2021-10-16 ENCOUNTER — Other Ambulatory Visit: Payer: Self-pay

## 2021-10-16 DIAGNOSIS — M6281 Muscle weakness (generalized): Secondary | ICD-10-CM | POA: Diagnosis present

## 2021-10-16 DIAGNOSIS — G8929 Other chronic pain: Secondary | ICD-10-CM | POA: Diagnosis present

## 2021-10-16 DIAGNOSIS — M25511 Pain in right shoulder: Secondary | ICD-10-CM | POA: Insufficient documentation

## 2021-10-16 DIAGNOSIS — R262 Difficulty in walking, not elsewhere classified: Secondary | ICD-10-CM | POA: Diagnosis present

## 2021-10-16 DIAGNOSIS — S161XXA Strain of muscle, fascia and tendon at neck level, initial encounter: Secondary | ICD-10-CM | POA: Diagnosis not present

## 2021-10-16 DIAGNOSIS — M25562 Pain in left knee: Secondary | ICD-10-CM | POA: Insufficient documentation

## 2021-10-16 DIAGNOSIS — R600 Localized edema: Secondary | ICD-10-CM | POA: Diagnosis present

## 2021-10-16 DIAGNOSIS — M25561 Pain in right knee: Secondary | ICD-10-CM | POA: Diagnosis present

## 2021-10-16 NOTE — Patient Instructions (Signed)
Aquatic Therapy at [facility]-  What to Expect!  Where:   [facility] [address] [facility] [address] [place], [place] [address] Rehab phone [phone]  NOTE:  You will receive an automated phone message reminding you of your appt and it will say the appointment is at the [address] [facility] [address] [facility] clinic.          How to Prepare: Please make sure you drink 8 ounces of water about one hour prior to your pool session A caregiver may attend if needed with the patient to help assist as needed. A caregiver can sit in the pool room on chair. Please arrive IN YOUR SUIT and 15 minutes prior to your appointment - this helps to avoid delays in starting your session. Please make sure to attend to any toileting needs prior to entering the pool Locker rooms for changing are provided.   There is direct access to the pool deck form the locker room.  You can lock your belongings in a locker with lock provided. Once on the pool deck your therapist will ask if you have signed the Patient  Consent and Assignment of Benefits form before beginning treatment Your therapist may take your blood pressure prior to, during and after your session if indicated We usually try and create a home exercise program based on activities we do in the pool.  Please be thinking about who might be able to assist you in the pool should you need to participate in an aquatic home exercise program at the time of discharge if you need assistance.  Some patients do not want to or do not have the ability to participate in an aquatic home program - this is not a barrier in any way to you participating in aquatic therapy as part of your current therapy plan! After Discharge from PT, you can continue using home program at  the [facility]/, there is a drop-in fee for $5 ($45 a month)or for 60 years  or older $4.00 ($40 a month for seniors ) or any local [employer] pool.  Memberships for purchase are  available for gym/pool at [facility]  IT IS VERY IMPORTANT THAT YOUR LAST VISIT BE IN THE CLINIC AT [address] AFTER YOUR LAST AQUATIC VISIT.  PLEASE MAKE SURE THAT YOU HAVE A [address]  APPOINTMENT SCHEDULED.   About the pool: Pool is located approximately 500 FT from the entrance of the building.  Please bring a support person if you need assistance traveling this      distance.   Your therapist will assist you in entering the water; there are two ways to           enter: stairs with railings, and a mechanical lift. Your therapist will determine the most appropriate way for you.  Water temperature is usually between 88-90 degrees  There may be up to 2 other swimmers in the pool at the same time  The pool deck is tile, please wear shoes with good traction if you prefer not to be barefoot.    Contact Info:  For appointment scheduling and cancellations:         Please call the [facility]  PH:[phone]              Aquatic Therapy  Outpatient [facility]       All sessions are 45 minutes                                                    

## 2021-10-23 ENCOUNTER — Encounter: Payer: Self-pay | Admitting: Physical Therapy

## 2021-10-23 ENCOUNTER — Ambulatory Visit: Payer: BC Managed Care – PPO | Admitting: Physical Therapy

## 2021-10-23 DIAGNOSIS — M25511 Pain in right shoulder: Secondary | ICD-10-CM | POA: Diagnosis not present

## 2021-10-23 DIAGNOSIS — R600 Localized edema: Secondary | ICD-10-CM

## 2021-10-23 DIAGNOSIS — G8929 Other chronic pain: Secondary | ICD-10-CM
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
## 2021-10-24 ENCOUNTER — Ambulatory Visit: Payer: BC Managed Care – PPO

## 2021-10-24 DIAGNOSIS — G8929 Other chronic pain: Secondary | ICD-10-CM

## 2021-10-24 DIAGNOSIS — M25511 Pain in right shoulder: Secondary | ICD-10-CM | POA: Diagnosis not present

## 2021-10-24 DIAGNOSIS — M25562 Pain in left knee: Secondary | ICD-10-CM
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
## 2021-10-29 ENCOUNTER — Encounter: Payer: BC Managed Care – PPO | Admitting: Dietician

## 2021-10-30 ENCOUNTER — Emergency Department (HOSPITAL_COMMUNITY): Payer: BC Managed Care – PPO

## 2021-10-30 ENCOUNTER — Ambulatory Visit: Payer: BC Managed Care – PPO

## 2021-10-30 ENCOUNTER — Encounter (HOSPITAL_COMMUNITY): Payer: Self-pay

## 2021-10-30 ENCOUNTER — Emergency Department (HOSPITAL_COMMUNITY)
Admission: EM | Admit: 2021-10-30 | Discharge: 2021-10-30 | Disposition: A | Discharge status: Home / Self Care | Payer: BC Managed Care – PPO | Attending: Emergency Medicine | Admitting: Emergency Medicine

## 2021-10-30 ENCOUNTER — Other Ambulatory Visit: Payer: Self-pay

## 2021-10-30 ENCOUNTER — Ambulatory Visit: Payer: BC Managed Care – PPO | Admitting: Orthopaedic Surgery

## 2021-10-30 DIAGNOSIS — M5030 Other cervical disc degeneration, unspecified cervical region: Secondary | ICD-10-CM | POA: Insufficient documentation

## 2021-10-30 DIAGNOSIS — M79601 Pain in right arm: Secondary | ICD-10-CM | POA: Insufficient documentation

## 2021-10-30 DIAGNOSIS — E049 Nontoxic goiter, unspecified: Secondary | ICD-10-CM | POA: Diagnosis not present

## 2021-10-30 MED ORDER — PREDNISONE 20 MG PO TABS: 40.0000 mg | ORAL_TABLET | Freq: Every day | ORAL | 0 refills | Status: DC

## 2021-10-30 MED ORDER — DIAZEPAM 5 MG PO TABS
5.0000 mg | ORAL_TABLET | Freq: Once | ORAL | Status: AC
  Administered 2021-10-30: 5 mg via ORAL
  Filled 2021-10-30: qty 1

## 2021-10-30 MED ORDER — CYCLOBENZAPRINE HCL 10 MG PO TABS: 10.0000 mg | ORAL_TABLET | Freq: Three times a day (TID) | ORAL | 0 refills | Status: DC | PRN

## 2021-10-30 MED ORDER — KETOROLAC TROMETHAMINE 30 MG/ML IJ SOLN
30.0000 mg | Freq: Once | INTRAMUSCULAR | Status: AC
Start: 2021-10-30 — End: 2021-10-30
  Administered 2021-10-30: 30 mg via INTRAMUSCULAR
  Filled 2021-10-30: qty 1
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
## 2021-10-30 NOTE — Discharge Instructions (Addendum)
The x-ray of your shoulder and the CT scan of your neck revealed no fractures.  The CT scan showed some degenerative changes in your cervical spine.  This could be contributing to your symptoms.  I recommend that you see a spine specialist.  Please contact the doctor listed.  The CT scan also showed a larger than normal thyroid. You will need to have your primary care doctor follow-up on this and you will probably need to have an outpatient ultrasound of your thyroid.  You have been prescribed a muscle relaxer and a steroid.  Take as directed.  The muscle relaxer can make you drowsy.  Do not drive or operate machinery or make important decisions while taking this.

## 2021-10-30 NOTE — ED Notes (Signed)
I provided reinforced discharge education based off of discharge instructions. Pt acknowledged and understood my education. Pt had no further questions/concerns for provider/myself.  °

## 2021-10-30 NOTE — ED Notes (Signed)
Pt transported to XR.  
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
## 2021-10-31 ENCOUNTER — Ambulatory Visit: Payer: BC Managed Care – PPO

## 2021-10-31 DIAGNOSIS — M25511 Pain in right shoulder: Secondary | ICD-10-CM | POA: Diagnosis not present

## 2021-10-31 DIAGNOSIS — R600 Localized edema: Secondary | ICD-10-CM

## 2021-10-31 DIAGNOSIS — M6281 Muscle weakness (generalized): Secondary | ICD-10-CM

## 2021-10-31 DIAGNOSIS — R262 Difficulty in walking, not elsewhere classified: Secondary | ICD-10-CM

## 2021-10-31 DIAGNOSIS — G8929 Other chronic pain: Secondary | ICD-10-CM
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
## 2021-11-06 ENCOUNTER — Telehealth: Payer: Self-pay

## 2021-11-06 ENCOUNTER — Ambulatory Visit: Payer: BC Managed Care – PPO

## 2021-11-07 ENCOUNTER — Ambulatory Visit: Payer: BC Managed Care – PPO

## 2021-11-19 ENCOUNTER — Ambulatory Visit: Payer: BC Managed Care – PPO | Attending: Surgery

## 2021-11-19 DIAGNOSIS — G8929 Other chronic pain: Secondary | ICD-10-CM | POA: Diagnosis present

## 2021-11-19 DIAGNOSIS — M25561 Pain in right knee: Secondary | ICD-10-CM | POA: Diagnosis present

## 2021-11-19 DIAGNOSIS — R262 Difficulty in walking, not elsewhere classified: Secondary | ICD-10-CM | POA: Diagnosis present

## 2021-11-19 DIAGNOSIS — M25562 Pain in left knee: Secondary | ICD-10-CM | POA: Insufficient documentation

## 2021-11-19 DIAGNOSIS — R600 Localized edema: Secondary | ICD-10-CM | POA: Diagnosis present

## 2021-11-19 DIAGNOSIS — M6281 Muscle weakness (generalized): Secondary | ICD-10-CM | POA: Insufficient documentation

## 2021-11-19 DIAGNOSIS — M25511 Pain in right shoulder: Secondary | ICD-10-CM | POA: Insufficient documentation
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
## 2021-11-20 ENCOUNTER — Encounter: Payer: Self-pay | Admitting: Orthopaedic Surgery

## 2021-11-20 ENCOUNTER — Ambulatory Visit (INDEPENDENT_AMBULATORY_CARE_PROVIDER_SITE_OTHER): Payer: BC Managed Care – PPO | Admitting: Orthopaedic Surgery

## 2021-11-20 VITALS — BP 138/82 | HR 94 | Ht 64.0 in | Wt 218.0 lb

## 2021-11-20 DIAGNOSIS — M1711 Unilateral primary osteoarthritis, right knee: Secondary | ICD-10-CM | POA: Diagnosis not present
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
## 2021-11-27 ENCOUNTER — Ambulatory Visit: Payer: BC Managed Care – PPO

## 2021-11-27 DIAGNOSIS — M6281 Muscle weakness (generalized): Secondary | ICD-10-CM

## 2021-11-27 DIAGNOSIS — G8929 Other chronic pain: Secondary | ICD-10-CM

## 2021-11-27 DIAGNOSIS — R262 Difficulty in walking, not elsewhere classified: Secondary | ICD-10-CM

## 2021-11-27 DIAGNOSIS — R600 Localized edema: Secondary | ICD-10-CM

## 2021-11-27 DIAGNOSIS — M25511 Pain in right shoulder: Secondary | ICD-10-CM | POA: Diagnosis not present
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
## 2021-11-29 ENCOUNTER — Ambulatory Visit: Payer: BC Managed Care – PPO

## 2021-11-29 DIAGNOSIS — G8929 Other chronic pain: Secondary | ICD-10-CM

## 2021-11-29 DIAGNOSIS — M25511 Pain in right shoulder: Secondary | ICD-10-CM | POA: Diagnosis not present
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
## 2021-12-03 ENCOUNTER — Ambulatory Visit: Payer: BC Managed Care – PPO

## 2021-12-03 DIAGNOSIS — G8929 Other chronic pain: Secondary | ICD-10-CM

## 2021-12-03 DIAGNOSIS — M25511 Pain in right shoulder: Secondary | ICD-10-CM | POA: Diagnosis not present
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
## 2021-12-06 ENCOUNTER — Ambulatory Visit: Payer: BC Managed Care – PPO

## 2021-12-06 DIAGNOSIS — M6281 Muscle weakness (generalized): Secondary | ICD-10-CM

## 2021-12-06 DIAGNOSIS — M25511 Pain in right shoulder: Secondary | ICD-10-CM | POA: Diagnosis not present

## 2021-12-06 DIAGNOSIS — R600 Localized edema: Secondary | ICD-10-CM

## 2021-12-06 DIAGNOSIS — R262 Difficulty in walking, not elsewhere classified: Secondary | ICD-10-CM

## 2021-12-06 DIAGNOSIS — G8929 Other chronic pain: Secondary | ICD-10-CM
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
## 2021-12-12 ENCOUNTER — Ambulatory Visit: Payer: BC Managed Care – PPO | Attending: Surgery

## 2021-12-12 DIAGNOSIS — R262 Difficulty in walking, not elsewhere classified: Secondary | ICD-10-CM | POA: Diagnosis present

## 2021-12-12 DIAGNOSIS — M25562 Pain in left knee: Secondary | ICD-10-CM | POA: Diagnosis present

## 2021-12-12 DIAGNOSIS — G8929 Other chronic pain: Secondary | ICD-10-CM | POA: Insufficient documentation

## 2021-12-12 DIAGNOSIS — M25561 Pain in right knee: Secondary | ICD-10-CM | POA: Insufficient documentation

## 2021-12-12 DIAGNOSIS — M25511 Pain in right shoulder: Secondary | ICD-10-CM | POA: Insufficient documentation

## 2021-12-12 DIAGNOSIS — R600 Localized edema: Secondary | ICD-10-CM | POA: Diagnosis present

## 2021-12-12 DIAGNOSIS — M6281 Muscle weakness (generalized): Secondary | ICD-10-CM | POA: Insufficient documentation
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
## 2021-12-19 ENCOUNTER — Encounter: Payer: BC Managed Care – PPO | Admitting: Physical Therapy

## 2021-12-20 ENCOUNTER — Telehealth: Payer: Self-pay

## 2021-12-26 ENCOUNTER — Ambulatory Visit (INDEPENDENT_AMBULATORY_CARE_PROVIDER_SITE_OTHER): Payer: BC Managed Care – PPO | Admitting: Obstetrics and Gynecology

## 2021-12-26 ENCOUNTER — Encounter: Payer: Self-pay | Admitting: Obstetrics and Gynecology

## 2021-12-26 VITALS — BP 136/83 | HR 92 | Ht 64.0 in | Wt 210.0 lb

## 2021-12-26 DIAGNOSIS — N939 Abnormal uterine and vaginal bleeding, unspecified: Secondary | ICD-10-CM

## 2021-12-26 DIAGNOSIS — Z1231 Encounter for screening mammogram for malignant neoplasm of breast: Secondary | ICD-10-CM | POA: Diagnosis not present

## 2021-12-26 NOTE — Progress Notes (Signed)
[name] presents for eval of DUB. Pt reports monthly cycles, lasting 5-7 days til the past 6 months Since she has noted skipping months, heavier and long cycles. For the last month she has had a brownish discharge  Known H/O uterine fibroids Pap 5/23 normal  H/O HTN and DM, managed by PCP  Needs mammogram  Denies CP, SOB, bowel or bladder dysfunction  H/O TSVD x 1 H/O c section x 1  PE AF VSS Lungs clear Heart RRR Abd soft + BS GU NL EGBUS, scan brownish discharge noted, no cervical lesion, uterus enlarged @ 16-18 weeks, non tender, no masses  A/P DUB        Enlarged uterus, suspect uterine fibroids  DUB reviewed with pt. Suspect this is perimenopausal related Will check GYN U/S. Discussed potential for EMBX at follow up visit. Information provided Will f/u after U/S to discuss results and further Tx options

## 2021-12-26 NOTE — Patient Instructions (Addendum)
Endometrial Biopsy  An endometrial biopsy is a procedure to remove tissue samples from the endometrium, which is the lining of the uterus. The tissue that is removed can then be checked under a microscope for disease. This procedure is used to diagnose conditions such as endometrial cancer, endometrial tuberculosis, polyps, or other inflammatory conditions. This procedure may also be used to investigate uterine bleeding to determine where you are in your menstrual cycle or how your hormone levels are affecting the lining of the uterus. Tell a health care provider about: Any allergies you have. All medicines you are taking, including vitamins, herbs, eye drops, creams, and over-the-counter medicines. Any problems you or family members have had with anesthetic medicines. Any bleeding problems you have. Any surgeries you have had. Any medical conditions you have. Whether you are pregnant or may be pregnant. What are the risks? Your health care provider will talk with you about risks. These may include: Bleeding. Pelvic infection. Puncture of the wall of the uterus with the biopsy device (rare). Allergic reactions to medicines. What happens before the procedure? Keep a record of your menstrual cycles as told by your health care provider. You may need to schedule your procedure for a specific time in your cycle. Bring a sanitary pad in case you need to wear one after the procedure. Ask your health care provider about: Changing or stopping your regular medicines. These include any diabetes medicines or blood thinners you take. Taking medicines such as aspirin and ibuprofen. These medicines can thin your blood. Do not take these medicines unless your health care provider tells you to. Taking over-the-counter medicines, vitamins, herbs, and supplements. Plan to have someone take you home from the hospital or clinic. What happens during the procedure? You will lie on an exam table with your feet  and legs supported as in a pelvic exam. Your health care provider will insert an instrument into your vagina to see your cervix. Your cervix will be cleansed with an antiseptic solution. A medicine (local anesthetic) will be used to numb the cervix. A forceps instrument will be used to hold your cervix steady for the biopsy. A thin, rod-like instrument (uterine sound) will be inserted through your cervix to determine the length of your uterus and the location where the biopsy sample will be removed. A thin, flexible tube (catheter) will be inserted through your cervix and into the uterus. The catheter will be used to collect the biopsy sample from your endometrial tissue. The tube and instruments will be removed, and the tissue sample will be sent to a lab for examination. The procedure may vary among health care providers and hospitals. What happens after the procedure? Your blood pressure, heart rate, breathing rate, and blood oxygen level will be monitored until you leave the hospital or clinic. It is up to you to get the results of your procedure. Ask your health care provider, or the department that is doing the procedure, when your results will be ready. Summary An endometrial biopsy is a procedure to remove tissue samples from the endometrium, which is the lining of the uterus. This procedure is used to diagnose conditions such as endometrial cancer, endometrial tuberculosis, polyps, or other inflammatory conditions. It is up to you to get the results of your procedure. Ask your health care provider, or the department that is doing the procedure, when your results will be ready. This information is not intended to replace advice given to you by your health care provider. Make sure you  discuss any questions you have with your health care provider. Document Revised: 08/14/2021 Document Reviewed: 08/14/2021 Elsevier Patient Education  [place]. Abnormal Uterine Bleeding Abnormal  uterine bleeding means bleeding more than normal from your womb (uterus). It can include: Bleeding after sex. Bleeding between monthly (menstrual) periods. Bleeding that is heavier than normal. Monthly periods that last longer than normal. Bleeding after you have stopped having your monthly period (menopause). You should see a doctor for any kind of bleeding that is not normal. Treatment depends on the cause of your bleeding and how much you bleed. Follow these instructions at home: Medicines Take over-the-counter and prescription medicines only as told by your doctor. Ask your doctor about: Taking medicines such as aspirin and ibuprofen. Do not take these medicines unless your doctor tells you to take them. Taking over-the-counter medicines, vitamins, herbs, and supplements. You may be given iron pills. Take them as told by your doctor. Managing constipation If you take iron pills, you may need to take these actions to prevent or treat trouble pooping (constipation): Drink enough fluid to keep your pee (urine) pale yellow. Take over-the-counter or prescription medicines. Eat foods that are high in fiber. These include beans, whole grains, and fresh fruits and vegetables. Limit foods that are high in fat and sugar. These include fried or sweet foods. Activity Change your activity to decrease bleeding if you need to change your sanitary pad more than one time every 2 hours: Lie in bed with your feet raised (elevated). Place a cold pack on your lower belly. Rest as much as you are able until the bleeding stops or slows down. General instructions Do not use tampons, douche, or have sex until your doctor says these things are okay. Change your pads often. Get regular exams. These include: Pelvic exams. Screenings for cancer of the cervix. It is up to you to get the results of any tests that are done. Ask how to get your results when they are ready. Watch for any changes in your bleeding.  For 2 months, write down: When your monthly period starts. When your monthly period ends. When you get any abnormal bleeding from your vagina. What problems you notice. Keep all follow-up visits. Contact a doctor if: The bleeding lasts more than one week. You feel dizzy at times. You feel like you may vomit (nausea). You vomit. You feel light-headed or weak. Your symptoms get worse. Get help right away if: You faint. You have to change pads every hour. You have pain in your belly. You have a fever or chills. You get sweaty or weak. You pass large blood clots from your vagina. These symptoms may be an emergency. Get help right away. Call your local emergency services (911 in the [place] Do not wait to see if the symptoms will go away. Do not drive yourself to the hospital. Summary Abnormal uterine bleeding means bleeding more than normal from your womb (uterus). Any kind of bleeding that is not normal should be checked by a doctor. Treatment depends on the cause of your bleeding and how much you bleed. Get help right away if you faint, you have to change pads every hour, or you pass large blood clots from your vagina. This information is not intended to replace advice given to you by your health care provider. Make sure you discuss any questions you have with your health care provider. Document Revised: 08/29/2020 Document Reviewed: 08/29/2020 Elsevier Patient Education  [place].

## 2021-12-26 NOTE — Progress Notes (Signed)
Pt is new to office for irregular cycles.

## 2021-12-31 ENCOUNTER — Ambulatory Visit
Admission: RE | Admit: 2021-12-31 | Discharge: 2021-12-31 | Disposition: A | Discharge status: Home / Self Care | Payer: BC Managed Care – PPO | Source: Ambulatory Visit | Attending: Obstetrics and Gynecology | Admitting: Obstetrics and Gynecology

## 2021-12-31 DIAGNOSIS — N939 Abnormal uterine and vaginal bleeding, unspecified: Secondary | ICD-10-CM

## 2022-01-15 ENCOUNTER — Ambulatory Visit (INDEPENDENT_AMBULATORY_CARE_PROVIDER_SITE_OTHER): Payer: BC Managed Care – PPO | Admitting: Orthopaedic Surgery

## 2022-01-15 ENCOUNTER — Encounter: Payer: Self-pay | Admitting: Orthopaedic Surgery

## 2022-01-15 VITALS — BP 133/86 | HR 92 | Ht 64.0 in | Wt 216.0 lb

## 2022-01-15 DIAGNOSIS — E1169 Type 2 diabetes mellitus with other specified complication: Secondary | ICD-10-CM

## 2022-01-15 DIAGNOSIS — M25561 Pain in right knee: Secondary | ICD-10-CM | POA: Diagnosis not present

## 2022-01-15 DIAGNOSIS — G8929 Other chronic pain: Secondary | ICD-10-CM
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
## 2022-01-25 ENCOUNTER — Ambulatory Visit
Admission: RE | Admit: 2022-01-25 | Discharge: 2022-01-25 | Disposition: A | Discharge status: Home / Self Care | Payer: BC Managed Care – PPO | Source: Ambulatory Visit | Attending: Obstetrics and Gynecology | Admitting: Obstetrics and Gynecology

## 2022-01-25 DIAGNOSIS — Z1231 Encounter for screening mammogram for malignant neoplasm of breast: Secondary | ICD-10-CM

## 2022-02-06 ENCOUNTER — Encounter: Payer: Self-pay | Admitting: Obstetrics and Gynecology

## 2022-02-06 ENCOUNTER — Ambulatory Visit (INDEPENDENT_AMBULATORY_CARE_PROVIDER_SITE_OTHER): Payer: BC Managed Care – PPO | Admitting: Obstetrics and Gynecology

## 2022-02-06 VITALS — BP 160/90 | HR 85 | Ht 64.0 in | Wt 207.0 lb

## 2022-02-06 DIAGNOSIS — D219 Benign neoplasm of connective and other soft tissue, unspecified: Secondary | ICD-10-CM | POA: Diagnosis not present

## 2022-02-06 DIAGNOSIS — N939 Abnormal uterine and vaginal bleeding, unspecified: Secondary | ICD-10-CM | POA: Diagnosis not present

## 2022-02-06 NOTE — Patient Instructions (Signed)
Uterine Fibroids  Uterine fibroids are lumps of tissue (tumors) in the womb (uterus). Fibroids are not cancerous. Most women with this condition do not need treatment. Sometimes, fibroids can make it harder to have children. If this happens, you may need surgery to take out the fibroids. What are the causes? The cause of this condition is not known. What increases the risk? You are in your 30s or 40s and have not gone through menopause. Menopause is when you have not had a menstrual period for 12 months. Having a history of fibroids in your family. You are of African American descent. You started your period at age 10 or younger. You have not given birth. You are overweight or very overweight. What are the signs or symptoms? Bleeding between menstrual periods. Heavy bleeding during your menstrual period. Pain in the area between your hips. Needing to pee (urinate) right away or more often than usual. Not being able to have children (infertility). Not being able to stay pregnant (miscarriage). Many women do not have symptoms.  How is this treated? Treatment may include: Follow-up visits with your doctor to check your fibroids for any changes. Medicines to help with pain, such as aspirin or ibuprofen. Hormone therapy. This may be given as a pill, in a shot, or with a type of birth control device called an IUD. Surgery that would do one of these things: Take out the fibroids. This may be done if you want to become pregnant. Take out the womb (hysterectomy). Stop the blood flow to the fibroids. Follow these instructions at home: Medicines Take over-the-counter and prescription medicines only as told by your doctor. Ask your doctor if you should: Take iron pills. Eat more foods that have a lot of iron in them, such as dark green, leafy vegetables. Managing pain If told, put heat on your back or belly. Do this as often as told by your doctor. Use the heat source that your doctor  recommends, such as a moist heat pack or a heating pad. To do this: Put a towel between your skin and the heat pack or pad. Leave the heat on for 20-30 minutes. Take off the heat if your skin turns bright red. This is very important. If you cannot feel pain, heat, or cold, you may have a greater risk of getting burned.  General instructions Tell your doctor about any changes to your menstrual period, such as: Heavy bleeding that needs a change of tampons or pads more than normal. A change in how many days your period lasts. A change in symptoms that come with your period. This might be belly cramps or back pain. Keep all follow-up visits. Contact a doctor if: You have pain that does not get better with medicine or heat. This may include pain or cramps in: The area between your hip bones. Your back. Your belly. You have new bleeding between your periods. You have more bleeding during or between your periods. You feel very tired or weak. You feel dizzy. Get help right away if: You faint. You have pain in the area between your hip bones that gets worse. You have bleeding that soaks a tampon or pad in 30 minutes or less. Summary Uterine fibroids are lumps of tissue (tumors) in your womb. They are not cancerous. Medicines such as aspirin or ibuprofen may be used to help with pain. Contact a doctor if you have pain or cramps that do not get better with medicine. Know the symptoms for when you   should get help right away. This information is not intended to replace advice given to you by your health care provider. Make sure you discuss any questions you have with your health care provider. Document Revised: 11/30/2019 Document Reviewed: 11/30/2019 Elsevier Patient Education  2023 Elsevier Inc.  

## 2022-02-06 NOTE — Progress Notes (Signed)
56 y.o. GYN presents for Follow up of DUB. LMP was 10/2021.  Reports no problems today.

## 2022-02-06 NOTE — Progress Notes (Signed)
[name] presents for GYN U/S results and DUB Pt reports no more bleeding since last visit GYN U/S uterine fibroids, reviewed with pt  PE AF BP 160/90  Lungs clear Heart RRR Abd soft + BS enlarged uterus  A/P Uterine fibroids  DUB has resolved suspected related to menopause and uterine fibroids. Reviewed with pt. Will follow for now. Repeat GYN U/S in 6 months to confirm stability of uterine fibroids To see PCP for elevated BP. F/U PRN

## 2022-02-08 ENCOUNTER — Ambulatory Visit: Payer: BC Managed Care – PPO | Admitting: Obstetrics and Gynecology

## 2022-02-25 LAB — HM DIABETES EYE EXAM

## 2022-02-28 ENCOUNTER — Ambulatory Visit (INDEPENDENT_AMBULATORY_CARE_PROVIDER_SITE_OTHER): Payer: BC Managed Care – PPO | Admitting: Student

## 2022-02-28 VITALS — BP 139/83 | HR 70 | Wt 203.2 lb

## 2022-02-28 DIAGNOSIS — I10 Essential (primary) hypertension: Secondary | ICD-10-CM

## 2022-02-28 DIAGNOSIS — E041 Nontoxic single thyroid nodule: Secondary | ICD-10-CM

## 2022-02-28 DIAGNOSIS — Z7984 Long term (current) use of oral hypoglycemic drugs: Secondary | ICD-10-CM

## 2022-02-28 DIAGNOSIS — Z0000 Encounter for general adult medical examination without abnormal findings: Secondary | ICD-10-CM

## 2022-02-28 DIAGNOSIS — E1169 Type 2 diabetes mellitus with other specified complication: Secondary | ICD-10-CM

## 2022-02-28 DIAGNOSIS — D500 Iron deficiency anemia secondary to blood loss (chronic): Secondary | ICD-10-CM | POA: Diagnosis not present

## 2022-02-28 LAB — POCT GLYCOSYLATED HEMOGLOBIN (HGB A1C): Hemoglobin A1C: 8.5 % — AB (ref 4.0–5.6)

## 2022-02-28 LAB — GLUCOSE, CAPILLARY: Glucose-Capillary: 139 mg/dL — ABNORMAL HIGH (ref 70–99)

## 2022-02-28 MED ORDER — OZEMPIC (0.25 OR 0.5 MG/DOSE) 2 MG/1.5ML ~~LOC~~ SOPN: 0.5000 mg | PEN_INJECTOR | SUBCUTANEOUS | 11 refills | Status: DC

## 2022-02-28 MED ORDER — FERROUS SULFATE 325 (65 FE) MG PO TABS: ORAL_TABLET | ORAL | 2 refills | Status: DC

## 2022-02-28 NOTE — Patient Instructions (Addendum)
Hypertension Please continue you current medication Keep a log and bring it to your next appointment  Diabetes You A1c is improved! Keep up the good work Continue ozempic and metformin If you are tolerating the ozempic after a 1 mont or so on the medication give [place] a call and we can increase this to 1 mg weekly  Iron deficiency Continue you iron supplements and we will check you iron levels today  I have ordered a thyroid US to follow up on prior thyroid nodules  Please call to set up your colonoscopy  Gastroenterologist in [place], [place] Address: [address], [place], [place] [address] Phone: [phone]
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
## 2022-03-01 LAB — IRON,TIBC AND FERRITIN PANEL
Ferritin: 34 ng/mL (ref 15–150)
Iron Saturation: 8 % — CL (ref 15–55)
Iron: 30 ug/dL (ref 27–159)
Total Iron Binding Capacity: 355 ug/dL (ref 250–450)
UIBC: 325 ug/dL (ref 131–425)

## 2022-03-04 ENCOUNTER — Encounter: Payer: Self-pay | Admitting: Internal Medicine

## 2022-03-04 DIAGNOSIS — E113299 Type 2 diabetes mellitus with mild nonproliferative diabetic retinopathy without macular edema, unspecified eye: Secondary | ICD-10-CM | POA: Insufficient documentation

## 2022-03-05 ENCOUNTER — Other Ambulatory Visit (HOSPITAL_COMMUNITY): Payer: Self-pay

## 2022-03-05 ENCOUNTER — Other Ambulatory Visit: Payer: Self-pay | Admitting: Student

## 2022-03-05 MED ORDER — OZEMPIC (0.25 OR 0.5 MG/DOSE) 2 MG/3ML ~~LOC~~ SOPN
0.5000 mg | PEN_INJECTOR | SUBCUTANEOUS | 11 refills | Status: DC
  Filled 2022-03-05: qty 3, 28d supply, fill #0

## 2022-03-19 ENCOUNTER — Encounter: Payer: Self-pay | Admitting: Dietician

## 2022-03-19 NOTE — Assessment & Plan Note (Signed)
A1c improved from 9.8 to 8.5%. Is currently on metformin 1000 mg daily and ozempic 0.5 mg weekly. Increased her ozempic 2 weeks ago. Notes more diarrhea associated with this. Otherwise feeling well and reports losing weight and more energetic. Will continue on ozempic 0.5 mg weekly. She will call in 1 month if GI symptoms improve and can titrate up to 1 mg weekly.

## 2022-03-19 NOTE — Assessment & Plan Note (Addendum)
Has history of uterine fibroids, reports she has occasional spotting from this has appointment with GYN soon regarding this.  Recheck iron panel and TIBC. Refilled ferrous sulfate. [name]

## 2022-03-19 NOTE — Assessment & Plan Note (Addendum)
Unfortunately she left prior to getting her influenza vaccine. She will get this at her local pharmacy.  Provided contact information to schedule appointment for surveillance colonoscopy.

## 2022-03-19 NOTE — Assessment & Plan Note (Signed)
BP 139/83 today. She is stable on current medications. Continue amlodipine and olmesartan-HCTz. May improve with continued weight loss on GLP-1i.

## 2022-03-19 NOTE — Assessment & Plan Note (Signed)
History of left thyroid nodule. She is asymptomatic. No goiter and obvious mass on exam. Needs follow up thyroid US for left sided nodule.

## 2022-03-20 NOTE — Progress Notes (Signed)
Internal Medicine Clinic Attending ? ?Case discussed with Dr. Liang  At the time of the visit.  We reviewed the resident?s history and exam and pertinent patient test results.  I agree with the assessment, diagnosis, and plan of care documented in the resident?s note. ? ?

## 2022-05-09 ENCOUNTER — Ambulatory Visit (HOSPITAL_COMMUNITY)
Admission: RE | Admit: 2022-05-09 | Discharge: 2022-05-09 | Disposition: A | Discharge status: Home / Self Care | Payer: BC Managed Care – PPO | Source: Ambulatory Visit | Attending: Student in an Organized Health Care Education/Training Program | Admitting: Student in an Organized Health Care Education/Training Program

## 2022-05-09 DIAGNOSIS — E041 Nontoxic single thyroid nodule: Secondary | ICD-10-CM | POA: Diagnosis present

## 2022-05-17 ENCOUNTER — Encounter: Payer: Self-pay | Admitting: Student

## 2022-05-27 ENCOUNTER — Ambulatory Visit: Payer: BC Managed Care – PPO | Attending: Internal Medicine | Admitting: Internal Medicine

## 2022-05-27 ENCOUNTER — Encounter: Payer: Self-pay | Admitting: Internal Medicine

## 2022-05-27 DIAGNOSIS — E785 Hyperlipidemia, unspecified: Secondary | ICD-10-CM

## 2022-05-27 DIAGNOSIS — Z7689 Persons encountering health services in other specified circumstances: Secondary | ICD-10-CM

## 2022-05-27 DIAGNOSIS — N938 Other specified abnormal uterine and vaginal bleeding: Secondary | ICD-10-CM

## 2022-05-27 DIAGNOSIS — E1159 Type 2 diabetes mellitus with other circulatory complications: Secondary | ICD-10-CM | POA: Diagnosis not present

## 2022-05-27 DIAGNOSIS — M1711 Unilateral primary osteoarthritis, right knee: Secondary | ICD-10-CM

## 2022-05-27 DIAGNOSIS — Z23 Encounter for immunization: Secondary | ICD-10-CM

## 2022-05-27 DIAGNOSIS — E1169 Type 2 diabetes mellitus with other specified complication: Secondary | ICD-10-CM | POA: Diagnosis not present

## 2022-05-27 DIAGNOSIS — I152 Hypertension secondary to endocrine disorders: Secondary | ICD-10-CM

## 2022-05-27 DIAGNOSIS — E611 Iron deficiency: Secondary | ICD-10-CM | POA: Diagnosis not present

## 2022-05-27 DIAGNOSIS — E041 Nontoxic single thyroid nodule: Secondary | ICD-10-CM | POA: Diagnosis not present

## 2022-05-27 LAB — GLUCOSE, POCT (MANUAL RESULT ENTRY): POC Glucose: 136 mg/dl — AB (ref 70–99)

## 2022-05-27 MED ORDER — CARVEDILOL 3.125 MG PO TABS: 3.1250 mg | ORAL_TABLET | Freq: Two times a day (BID) | ORAL | 3 refills | Status: DC

## 2022-05-27 MED ORDER — ROSUVASTATIN CALCIUM 5 MG PO TABS: 5.0000 mg | ORAL_TABLET | Freq: Every day | ORAL | 3 refills | Status: DC

## 2022-05-27 MED ORDER — MELOXICAM 15 MG PO TABS: 15.0000 mg | ORAL_TABLET | Freq: Every day | ORAL | 2 refills | Status: DC

## 2022-05-27 NOTE — Patient Instructions (Signed)
Your blood pressure is not at goal.  Goal is 130/80 or lower.  Check your blood pressure at least twice a week and bring the readings with you when you come to see our clinical pharmacist in 3 weeks. We have added a new blood pressure medication called carvedilol 3.125 mg twice a day.  Your cholesterol is not at goal. We have added the cholesterol-lowering medication called rosuvastatin 5 mg daily.  We have added the medication called meloxicam 15 mg daily to help decrease the pain from your arthritis.  DASH Eating Plan DASH stands for Dietary Approaches to Stop Hypertension. The DASH eating plan is a healthy eating plan that has been shown to: Reduce high blood pressure (hypertension). Reduce your risk for type 2 diabetes, heart disease, and stroke. Help with weight loss. What are tips for following this plan? Reading food labels Check food labels for the amount of salt (sodium) per serving. Choose foods with less than 5 percent of the Daily Value of sodium. Generally, foods with less than 300 milligrams (mg) of sodium per serving fit into this eating plan. To find whole grains, look for the word "whole" as the first word in the ingredient list. Shopping Buy products labeled as "low-sodium" or "no salt added." Buy fresh foods. Avoid canned foods and pre-made or frozen meals. Cooking Avoid adding salt when cooking. Use salt-free seasonings or herbs instead of table salt or sea salt. Check with your health care provider or pharmacist before using salt substitutes. Do not fry foods. Cook foods using healthy methods such as baking, boiling, grilling, roasting, and broiling instead. Cook with heart-healthy oils, such as olive, canola, avocado, soybean, or sunflower oil. Meal planning  Eat a balanced diet that includes: 4 or more servings of fruits and 4 or more servings of vegetables each day. Try to fill one-half of your plate with fruits and vegetables. 6-8 servings of whole grains each  day. Less than 6 oz (170 g) of lean meat, poultry, or fish each day. A 3-oz (85-g) serving of meat is about the same size as a deck of cards. One egg equals 1 oz (28 g). 2-3 servings of low-fat dairy each day. One serving is 1 cup (237 mL). 1 serving of nuts, seeds, or beans 5 times each week. 2-3 servings of heart-healthy fats. Healthy fats called omega-3 fatty acids are found in foods such as walnuts, flaxseeds, fortified milks, and eggs. These fats are also found in cold-water fish, such as sardines, salmon, and mackerel. Limit how much you eat of: Canned or prepackaged foods. Food that is high in trans fat, such as some fried foods. Food that is high in saturated fat, such as fatty meat. Desserts and other sweets, sugary drinks, and other foods with added sugar. Full-fat dairy products. Do not salt foods before eating. Do not eat more than 4 egg yolks a week. Try to eat at least 2 vegetarian meals a week. Eat more home-cooked food and less restaurant, buffet, and fast food. Lifestyle When eating at a restaurant, ask that your food be prepared with less salt or no salt, if possible. If you drink alcohol: Limit how much you use to: 0-1 drink a day for women who are not pregnant. 0-2 drinks a day for men. Be aware of how much alcohol is in your drink. In the [place] one drink equals one 12 oz bottle of beer (355 mL), one 5 oz glass of wine (148 mL), or one 1 oz glass of hard  liquor (44 mL). General information Avoid eating more than 2,300 mg of salt a day. If you have hypertension, you may need to reduce your sodium intake to 1,500 mg a day. Work with your health care provider to maintain a healthy body weight or to lose weight. Ask what an ideal weight is for you. Get at least 30 minutes of exercise that causes your heart to beat faster (aerobic exercise) most days of the week. Activities may include walking, swimming, or biking. Work with your health care provider or dietitian to adjust  your eating plan to your individual calorie needs. What foods should I eat? Fruits All fresh, dried, or frozen fruit. Canned fruit in natural juice (without added sugar). Vegetables Fresh or frozen vegetables (raw, steamed, roasted, or grilled). Low-sodium or reduced-sodium tomato and vegetable juice. Low-sodium or reduced-sodium tomato sauce and tomato paste. Low-sodium or reduced-sodium canned vegetables. Grains Whole-grain or whole-wheat bread. Whole-grain or whole-wheat pasta. Brown rice. [place]. Bulgur. Whole-grain and low-sodium cereals. Pita bread. Low-fat, low-sodium crackers. Whole-wheat flour tortillas. Meats and other proteins Skinless chicken or [place]. Ground chicken or [place]. Pork with fat trimmed off. Fish and seafood. Egg whites. Dried beans, peas, or lentils. Unsalted nuts, nut butters, and seeds. Unsalted canned beans. Lean cuts of beef with fat trimmed off. Low-sodium, lean precooked or cured meat, such as sausages or meat loaves. Dairy Low-fat (1%) or fat-free (skim) milk. Reduced-fat, low-fat, or fat-free cheeses. Nonfat, low-sodium ricotta or cottage cheese. Low-fat or nonfat yogurt. Low-fat, low-sodium cheese. Fats and oils Soft margarine without trans fats. Vegetable oil. Reduced-fat, low-fat, or light mayonnaise and salad dressings (reduced-sodium). Canola, safflower, olive, avocado, soybean, and sunflower oils. Avocado. Seasonings and condiments Herbs. Spices. Seasoning mixes without salt. Other foods Unsalted popcorn and pretzels. Fat-free sweets. The items listed above may not be a complete list of foods and beverages you can eat. Contact a dietitian for more information. What foods should I avoid? Fruits Canned fruit in a light or heavy syrup. Fried fruit. Fruit in cream or butter sauce. Vegetables Creamed or fried vegetables. Vegetables in a cheese sauce. Regular canned vegetables (not low-sodium or reduced-sodium). Regular canned tomato sauce and paste  (not low-sodium or reduced-sodium). Regular tomato and vegetable juice (not low-sodium or reduced-sodium). Angie Fava. Olives. Grains Baked goods made with fat, such as croissants, muffins, or some breads. Dry pasta or rice meal packs. Meats and other proteins Fatty cuts of meat. Ribs. Fried meat. Berniece Salines. Bologna, salami, and other precooked or cured meats, such as sausages or meat loaves. Fat from the back of a pig (fatback). Bratwurst. Salted nuts and seeds. Canned beans with added salt. Canned or smoked fish. Whole eggs or egg yolks. Chicken or [place] with skin. Dairy Whole or 2% milk, cream, and half-and-half. Whole or full-fat cream cheese. Whole-fat or sweetened yogurt. Full-fat cheese. Nondairy creamers. Whipped toppings. Processed cheese and cheese spreads. Fats and oils Butter. Stick margarine. Lard. Shortening. Ghee. Bacon fat. Tropical oils, such as coconut, palm kernel, or palm oil. Seasonings and condiments Onion salt, garlic salt, seasoned salt, table salt, and sea salt. Worcestershire sauce. Tartar sauce. Barbecue sauce. Teriyaki sauce. Soy sauce, including reduced-sodium. Steak sauce. Canned and packaged gravies. Fish sauce. Oyster sauce. Cocktail sauce. Store-bought horseradish. Ketchup. Mustard. Meat flavorings and tenderizers. Bouillon cubes. Hot sauces. Pre-made or packaged marinades. Pre-made or packaged taco seasonings. Relishes. Regular salad dressings. Other foods Salted popcorn and pretzels. The items listed above may not be a complete list of foods and beverages you should  avoid. Contact a dietitian for more information. Where to find more information National Heart, Lung, and Blood Institute: https://wilson-eaton.com/ American Heart Association: www.heart.org Academy of Nutrition and Dietetics: www.eatright.[place]: www.kidney.org Summary The DASH eating plan is a healthy eating plan that has been shown to reduce high blood pressure (hypertension). It may  also reduce your risk for type 2 diabetes, heart disease, and stroke. When on the DASH eating plan, aim to eat more fresh fruits and vegetables, whole grains, lean proteins, low-fat dairy, and heart-healthy fats. With the DASH eating plan, you should limit salt (sodium) intake to 2,300 mg a day. If you have hypertension, you may need to reduce your sodium intake to 1,500 mg a day. Work with your health care provider or dietitian to adjust your eating plan to your individual calorie needs. This information is not intended to replace advice given to you by your health care provider. Make sure you discuss any questions you have with your health care provider. Document Revised: 04/02/2019 Document Reviewed: 04/02/2019 Elsevier Patient Education  [place].
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
## 2022-05-28 LAB — COMPREHENSIVE METABOLIC PANEL
ALT: 9 IU/L (ref 0–32)
AST: 16 IU/L (ref 0–40)
Albumin/Globulin Ratio: 1.4 (ref 1.2–2.2)
Albumin: 4.7 g/dL (ref 3.8–4.9)
Alkaline Phosphatase: 143 IU/L — ABNORMAL HIGH (ref 44–121)
BUN/Creatinine Ratio: 23 (ref 9–23)
BUN: 18 mg/dL (ref 6–24)
Bilirubin Total: <0.2 mg/dL (ref 0.0–1.2)
CO2: 25 mmol/L (ref 20–29)
Calcium: 10.1 mg/dL (ref 8.7–10.2)
Chloride: 99 mmol/L (ref 96–106)
Creatinine, Ser: 0.8 mg/dL (ref 0.57–1.00)
Globulin, Total: 3.3 g/dL (ref 1.5–4.5)
Glucose: 131 mg/dL — ABNORMAL HIGH (ref 70–99)
Potassium: 3.8 mmol/L (ref 3.5–5.2)
Sodium: 140 mmol/L (ref 134–144)
Total Protein: 8 g/dL (ref 6.0–8.5)
eGFR: 87 mL/min/{1.73_m2} (ref >59)

## 2022-05-28 LAB — LIPID PANEL
Chol/HDL Ratio: 3.4 ratio (ref 0.0–4.4)
Cholesterol, Total: 209 mg/dL — ABNORMAL HIGH (ref 100–199)
HDL: 61 mg/dL (ref >39)
LDL Chol Calc (NIH): 128 mg/dL — ABNORMAL HIGH (ref 0–99)
Triglycerides: 110 mg/dL (ref 0–149)
VLDL Cholesterol Cal: 20 mg/dL (ref 5–40)

## 2022-05-28 LAB — CBC
Hematocrit: 40.9 % (ref 34.0–46.6)
Hemoglobin: 12.6 g/dL (ref 11.1–15.9)
MCH: 24 pg — ABNORMAL LOW (ref 26.6–33.0)
MCHC: 30.8 g/dL — ABNORMAL LOW (ref 31.5–35.7)
MCV: 78 fL — ABNORMAL LOW (ref 79–97)
Platelets: 348 10*3/uL (ref 150–450)
RBC: 5.26 x10E6/uL (ref 3.77–5.28)
RDW: 13.4 % (ref 11.7–15.4)
WBC: 7 10*3/uL (ref 3.4–10.8)

## 2022-05-28 LAB — TSH+T4F+T3FREE
Free T4: 1.11 ng/dL (ref 0.82–1.77)
T3, Free: 2.9 pg/mL (ref 2.0–4.4)
TSH: 1.07 u[IU]/mL (ref 0.450–4.500)

## 2022-05-28 NOTE — Progress Notes (Signed)
Let patient know that she is no longer anemic.  She is taking iron supplement daily.  This can be decreased to taking 1 tablet 3-4 times a week instead. -Kidney function is good. -Mild elevation in one of her liver function test which we will observe for now. -Cholesterol levels improved but still not at goal.  Start the rosuvastatin as was discussed on recent visit to help lower the cholesterol. -Thyroid function test normal.

## 2022-06-07 ENCOUNTER — Telehealth: Payer: Self-pay | Admitting: Internal Medicine

## 2022-06-07 ENCOUNTER — Telehealth: Payer: Self-pay | Admitting: *Deleted

## 2022-06-07 NOTE — Telephone Encounter (Signed)
Result note read to pt, verbalizes understanding.    Signed      Let patient know that she is no longer anemic.  She is taking iron supplement daily.  This can be decreased to taking 1 tablet 3-4 times a week instead. -Kidney function is good. -Mild elevation in one of her liver function test which we will observe for now. -Cholesterol levels improved but still not at goal.  Start the rosuvastatin as was discussed on recent visit to help lower the cholesterol. -Thyroid function test normal.

## 2022-06-07 NOTE — Telephone Encounter (Signed)
Pt is calling for lab results. Attempted to call NT - Paulette the line was busy. Pt hung up. Please advise CB- [phone]

## 2022-06-18 ENCOUNTER — Encounter (HOSPITAL_COMMUNITY): Payer: Self-pay

## 2022-06-18 ENCOUNTER — Ambulatory Visit (HOSPITAL_COMMUNITY)
Admission: RE | Admit: 2022-06-18 | Discharge: 2022-06-18 | Disposition: A | Discharge status: Home / Self Care | Payer: BC Managed Care – PPO | Source: Ambulatory Visit | Attending: Family Medicine | Admitting: Family Medicine

## 2022-06-18 VITALS — BP 133/74 | HR 87 | Temp 98.0°F | Resp 14 | Wt 203.0 lb

## 2022-06-18 DIAGNOSIS — R21 Rash and other nonspecific skin eruption: Secondary | ICD-10-CM

## 2022-06-18 DIAGNOSIS — Z881 Allergy status to other antibiotic agents status: Secondary | ICD-10-CM | POA: Diagnosis not present

## 2022-06-18 MED ORDER — TRIAMCINOLONE ACETONIDE 0.1 % EX CREA: 1.0000 | TOPICAL_CREAM | Freq: Two times a day (BID) | CUTANEOUS | 0 refills | Status: DC

## 2022-06-18 NOTE — ED Triage Notes (Signed)
Pt states that she has a rash on her neck.x1 week Left side. Itchy red and hurting. Used anti fungal cream and peroxide.

## 2022-06-19 NOTE — ED Provider Notes (Signed)
  [facility]   [id number] 06/18/22 Arrival Time: 7106  ASSESSMENT & PLAN:  1. Rash and nonspecific skin eruption   2. Allergy to Neosporin    No signs of skin infection. Discussed possibility of shingles but very low suspicion. Suspect current rash is from hypersensitivity to Neosporin. Will stop using. Begin: Meds ordered this encounter  Medications   triamcinolone cream (KENALOG) 0.1 %    Sig: Apply 1 Application topically 2 (two) times daily.    Dispense:  30 g    Refill:  0   Will follow up with PCP or here if worsening or failing to improve as anticipated. Reviewed expectations re: course of current medical issues. Questions answered. Outlined signs and symptoms indicating need for more acute intervention. Patient verbalized understanding. After Visit Summary given.   SUBJECTIVE:  [name] is a 57 y.o. female who presents with a skin complaint. Rash on neck small area. Is itching. Reports increased erythema and itching. Has been applying Neosporin daily. Afebrile.  OBJECTIVE: Vitals:   06/18/22 1732 06/18/22 1735  BP:  133/74  Pulse:  87  Resp:  14  Temp:  98 F (36.7 C)  TempSrc:  Oral  SpO2:  97%  Weight: 92.1 kg     General appearance: alert; no distress HEENT: [place]; AT Neck: supple with FROM Lungs: clear to auscultation bilaterally Extremities: no edema; moves all extremities normally Skin: warm and dry; approx 1 x 2.5 cm area of irritated, erythematous skin with overlying papules Psychological: alert and cooperative; normal mood and affect  No Known Allergies  Past Medical History:  Diagnosis Date   Anemia    CHF (congestive heart failure) (HCC)    Diabetes mellitus    Heart murmur    Hypertension    Pneumonia    Social History   Socioeconomic History   Marital status: Married    Spouse name: Not on file   Number of children: Not on file   Years of education: Not on file   Highest education level: Not on file  Occupational  History   Occupation: unemployed  Tobacco Use   Smoking status: Never   Smokeless tobacco: Never  Vaping Use   Vaping Use: Never used  Substance and Sexual Activity   Alcohol use: Yes    Comment: occasional   Drug use: No   Sexual activity: Yes    Birth control/protection: None  Other Topics Concern   Not on file  Social History Narrative   She lives with her husband and daughter who is 25 son who is 48.  Also has 2 grown children and 4 grandchildren.  She works as a Agricultural engineer for middle school hours.   Social Determinants of Health   Financial Resource Strain: Not on file  Food Insecurity: Not on file  Transportation Needs: Not on file  Physical Activity: Not on file  Stress: Not on file  Social Connections: Not on file  Intimate Partner Violence: Not on file   Family History  Problem Relation Age of Onset   Hypertension Mother    Hypertension Brother    Diabetes Neg Hx    Past Surgical History:  Procedure Laterality Date   CESAREAN SECTION     TUBAL LIGATION        Vanessa Kick, MD 06/19/22 1018

## 2022-07-04 ENCOUNTER — Ambulatory Visit: Payer: BC Managed Care – PPO | Admitting: Pharmacist
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
## 2022-08-07 ENCOUNTER — Other Ambulatory Visit: Payer: BC Managed Care – PPO

## 2022-08-09 ENCOUNTER — Ambulatory Visit (HOSPITAL_COMMUNITY)
Admission: RE | Admit: 2022-08-09 | Discharge: 2022-08-09 | Disposition: A | Discharge status: Home / Self Care | Payer: BC Managed Care – PPO | Source: Ambulatory Visit | Attending: Nurse Practitioner | Admitting: Nurse Practitioner

## 2022-08-09 ENCOUNTER — Encounter (HOSPITAL_COMMUNITY): Payer: Self-pay

## 2022-08-09 VITALS — BP 151/90 | HR 85 | Temp 98.0°F | Resp 18 | Ht 64.0 in | Wt 208.0 lb

## 2022-08-09 DIAGNOSIS — L03116 Cellulitis of left lower limb: Secondary | ICD-10-CM | POA: Diagnosis not present

## 2022-08-09 MED ORDER — CEPHALEXIN 500 MG PO CAPS: 500.0000 mg | ORAL_CAPSULE | Freq: Four times a day (QID) | ORAL | 0 refills | Status: AC

## 2022-08-09 MED ORDER — MUPIROCIN 2 % EX OINT: 1.0000 | TOPICAL_OINTMENT | Freq: Two times a day (BID) | CUTANEOUS | 0 refills | Status: DC

## 2022-08-09 NOTE — ED Triage Notes (Signed)
Sore on the left leg that is painful and slow healing. Patient is diabatic. Onset 2 weeks ago.  Has been using neosporin, peroxides, and using antiseptic daily.
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
## 2022-08-09 NOTE — Discharge Instructions (Signed)
I suspect the wound on your left leg is infected Take the Keflex as prescribed to treat it-this is 4 times daily for 10 days Stop using antiseptic and peroxide on the wound-instead, clean it with a mild soap and water twice daily, then apply a thin layer of the mupirocin ointment and keep covered when you are not at home with nonadherent gauze and bandage Follow-up with PCP as planned for reevaluation of wound

## 2022-08-12 ENCOUNTER — Ambulatory Visit: Payer: BC Managed Care – PPO | Attending: Family Medicine | Admitting: Pharmacist

## 2022-08-12 VITALS — BP 136/79 | Wt 204.0 lb

## 2022-08-12 DIAGNOSIS — I152 Hypertension secondary to endocrine disorders: Secondary | ICD-10-CM | POA: Diagnosis not present

## 2022-08-12 DIAGNOSIS — E1159 Type 2 diabetes mellitus with other circulatory complications: Secondary | ICD-10-CM

## 2022-08-12 MED ORDER — SPIRONOLACTONE 25 MG PO TABS: 25.0000 mg | ORAL_TABLET | Freq: Every day | ORAL | 1 refills | Status: DC

## 2022-08-12 NOTE — Progress Notes (Signed)
S:    PCP: Dr. Wynetta Emery  57 y.o. female who presents for hypertension evaluation, education, and management. PMH is significant for HTN, T2DM, OA. Patient was referred and last seen by Primary Care Provider, Dr. Wynetta Emery, on 05/27/2022 where BP was 159/92 mmHg and patient was started on carvedilol 3.125 mg BID.   Today, patient arrives in spirits and presents without assistance. Denies dizziness, headache, blurred vision, swelling. She states she has been taking carvedilol once daily and would like to avoid BID medications. 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 She also expresses concern regarding cellulitis of the knee. States she is on day 3/10 of antibiotics. She also request a dermatology referral for a dark spot that developed around her neck ~1 month ago. Denies itchiness, flaking, or irritation. Mostly has cosmetic concern. She does apply coco butter to the spot daily.   Patient reports hypertension was diagnosed since she was 57 YO.    Medication adherence reported to all medications other than carvedilol (taking once daily instead of BID). Patient has taken BP medications today.   Current antihypertensives include: carvedilol 3.125 mg BID (takes as once daily), amlodipine 10 mg daily, olmesartan-HCTZ 40-25 mg daily  Reported home BP readings:  -before meds 150/90,  -middle day 130-149/80  O:  Vitals:   08/12/22 1548  BP: 136/79    Last 3 Office BP readings: BP Readings from Last 3 Encounters:  08/09/22 (!) 151/90  06/18/22 133/74  05/27/22 (!) 159/92    BMET    Component Value Date/Time   NA 140 05/27/2022 1450   K 3.8 05/27/2022 1450   CL 99 05/27/2022 1450   CO2 25 05/27/2022 1450   GLUCOSE 131 (H) 05/27/2022 1450   GLUCOSE 314 (H) 03/20/2020 1645   BUN 18 05/27/2022 1450   CREATININE 0.80 05/27/2022 1450   CREATININE 0.78 03/12/2016 1249   CALCIUM 10.1 05/27/2022 1450   GFRNONAA 67 03/31/2017 1614   GFRNONAA >89 11/03/2014 1258    GFRAA 77 03/31/2017 1614   GFRAA >89 11/03/2014 1258    Renal function: CrCl cannot be calculated (Patient's most recent lab result is older than the maximum 21 days allowed.).  Clinical ASCVD: No  The 10-year ASCVD risk score (Arnett DK, et al., 2019) is: 19.4%   Values used to calculate the score:     Age: 4 years     Sex: Female     Is Non-Hispanic African American: Yes     Diabetic: Yes     Tobacco smoker: No     Systolic Blood Pressure: 123XX123 mmHg     Is BP treated: Yes     HDL Cholesterol: 61 mg/dL     Total Cholesterol: 209 mg/dL   A/P: Hypertension longstanding, currently close to goal on current medications. However, patient has been taking carvedilol once daily instead of BID. She request a once daily medication. Patient has chronic hypokalemia which may make her a good spironolactone candidate. She is agreeable to lab follow up in 4 weeks. BP goal < 130/80 mmHg.  -Discontinued carvedilol 3.125 mg BID as patient request once daily medication. -Started spironolactone 25 mg daily. Patient endorses she will return in 4 weeks for BMET.  -Continued amlodipine 10 mg daily and olmesartan-HCTZ 40-25 mg daily.   -Patient educated on purpose, proper use, and potential adverse effects of spironolactone.  -F/u labs ordered - BMET at f/up -Counseled on lifestyle modifications for blood pressure control including reduced dietary sodium, increased  exercise, adequate sleep. -Encouraged patient to check BP at home and bring log of readings to next visit. Counseled on proper use of home BP cuff.   Results reviewed and written information provided.    Written patient instructions provided. Patient verbalized understanding of treatment plan.  Total time in face to face counseling 30 minutes.    Follow-up:  Pharmacist 4 weeks. PCP clinic visit in May 2024.  Joseph Art, Pharm.D. PGY-2 Ambulatory Care Pharmacy Resident

## 2022-08-26 ENCOUNTER — Telehealth: Payer: Self-pay

## 2022-08-26 DIAGNOSIS — L989 Disorder of the skin and subcutaneous tissue, unspecified: Secondary | ICD-10-CM

## 2022-08-26 NOTE — Telephone Encounter (Signed)
Copied from CRM [id number]. Topic: Referral - Request for Referral >> Aug 26, 2022  3:51 PM Macon Large wrote: Has patient seen PCP for this complaint? Yes.   *If NO, is insurance requiring patient see PCP for this issue before PCP can refer them? Referral for which specialty: Endocrinology and Dermatology Preferred provider/office: [facility] Reason for referral: pt stated that she is diabetic and nervous that the cut is not healing

## 2022-08-26 NOTE — Addendum Note (Signed)
Addended by: Jonah Blue B on: 08/26/2022 08:09 PM   Modules accepted: Orders

## 2022-08-27 NOTE — Telephone Encounter (Signed)
Called & spoke to the patient. Verified name & DOB. Informed that referrals have been submitted. Patient expressed verbal understanding.

## 2022-09-03 ENCOUNTER — Encounter: Payer: Self-pay | Admitting: Student

## 2022-09-03 ENCOUNTER — Ambulatory Visit (INDEPENDENT_AMBULATORY_CARE_PROVIDER_SITE_OTHER): Payer: BC Managed Care – PPO | Admitting: Student

## 2022-09-03 VITALS — BP 173/87 | HR 79 | Temp 98.2°F | Ht 64.0 in | Wt 205.3 lb

## 2022-09-03 DIAGNOSIS — E1169 Type 2 diabetes mellitus with other specified complication: Secondary | ICD-10-CM | POA: Diagnosis not present

## 2022-09-03 DIAGNOSIS — I152 Hypertension secondary to endocrine disorders: Secondary | ICD-10-CM

## 2022-09-03 DIAGNOSIS — D500 Iron deficiency anemia secondary to blood loss (chronic): Secondary | ICD-10-CM

## 2022-09-03 DIAGNOSIS — S81802D Unspecified open wound, left lower leg, subsequent encounter: Secondary | ICD-10-CM

## 2022-09-03 DIAGNOSIS — E785 Hyperlipidemia, unspecified: Secondary | ICD-10-CM

## 2022-09-03 DIAGNOSIS — E1159 Type 2 diabetes mellitus with other circulatory complications: Secondary | ICD-10-CM | POA: Diagnosis not present

## 2022-09-03 DIAGNOSIS — Z7985 Long-term (current) use of injectable non-insulin antidiabetic drugs: Secondary | ICD-10-CM

## 2022-09-03 DIAGNOSIS — Z7984 Long term (current) use of oral hypoglycemic drugs: Secondary | ICD-10-CM

## 2022-09-03 LAB — POCT GLYCOSYLATED HEMOGLOBIN (HGB A1C): Hemoglobin A1C: 8.6 % — AB (ref 4.0–5.6)

## 2022-09-03 LAB — GLUCOSE, CAPILLARY: Glucose-Capillary: 138 mg/dL — ABNORMAL HIGH (ref 70–99)

## 2022-09-03 MED ORDER — OZEMPIC (0.25 OR 0.5 MG/DOSE) 2 MG/3ML ~~LOC~~ SOPN: 0.5000 mg | PEN_INJECTOR | SUBCUTANEOUS | 11 refills | Status: DC

## 2022-09-03 MED ORDER — ROSUVASTATIN CALCIUM 10 MG PO TABS
10.0000 mg | ORAL_TABLET | Freq: Every day | ORAL | 11 refills | Status: DC
Start: 2022-09-03 — End: 2023-04-21

## 2022-09-03 NOTE — Patient Instructions (Addendum)
It was a pleasure seeing you in clinic today.   Diabetes Start ozmepic 0.5 mg weekly and continue metformin  Hypertension You BP is elevated today Start spironolactone 25 mg daily  Continue amlodipine  Cholesterol Rosuvastatin 10 mg   Iron deficiency  We will repeat your iron studies Continue iron supplements you can try taking this every other day to help with constipation Please get a vaginal [place] to evaluate the bleeding   Follow up in 4 weeks
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
## 2022-09-04 NOTE — Assessment & Plan Note (Addendum)
Reports she is taking iron daily. Having some constipation. No longer having any vaginal bleeding. Hgb stable in January.  Did not follow up with OBGYN. Recommend she have a transvaginal ultrasound to evaluate abnormal uterine bleeding. Recommended every other day dosing of iron to help with constipation. Will check iron studies at next visit.

## 2022-09-04 NOTE — Assessment & Plan Note (Addendum)
A1c 8.6% currently taking metformin 1000 mg daily and Ozempic 0.25 mg weekly.  Did briefly take Ozempic 0.5 mg for 2 weeks without adverse reaction.  Reports she feels resistant to increasing her medicines.  Is tolerating metformin 500 twice daily.  Has had significant diarrhea when trying to titrate her up in the past.  Stopped SGLT-2 due to frequent urination. We discussed the importance of good glycemic control as well as her concerns with her medications.  Advised that her current Ozempic is a starting dose and recommended that she titrate at least to 0.5 mg weekly for glycemic control given that her A1c remains elevated.  She is also working on diet modification and limiting fast food which she was eating frequently prior to this.  I encouraged her to continue with diet and exercise.  She is agreeable to increasing her Ozempic.  Continue metformin 1000 mg daily Increase Ozempic to 0.5 mg weekly Repeat A1c in 3 months

## 2022-09-04 NOTE — Assessment & Plan Note (Signed)
BP elevated 173/87 Is taking olmesartan-HCTZ 40-25mg  and amlodipine  daily, recently had spironolactone added on by [facility] but has not started this. Encouraged her to pick up spironolactone 25 mg daily. Follow up BP in 1 month, BMP at next visit.

## 2022-09-04 NOTE — Assessment & Plan Note (Signed)
Lipid Panel     Component Value Date/Time   CHOL 209 (H) 05/27/2022 1450   TRIG 110 05/27/2022 1450   HDL 61 05/27/2022 1450   CHOLHDL 3.4 05/27/2022 1450   CHOLHDL 2.9 01/25/2014 0927   VLDL 13 01/25/2014 0927   LDLCALC 128 (H) 05/27/2022 1450   LABVLDL 20 05/27/2022 1450   ASCVD risk of 29%. Was started on rosuvastatin but she never picked this up. Encouraged her to start this. Discussed elevated risk with history of diabetes.   -Start rosuvastatin 10 mg daily

## 2022-09-09 DIAGNOSIS — S81802A Unspecified open wound, left lower leg, initial encounter: Secondary | ICD-10-CM | POA: Insufficient documentation

## 2022-09-09 NOTE — Assessment & Plan Note (Signed)
Patient with wound to the left medial lower extremity suspect from a prior bug bite that unfortunately ulcerated.  She was seen in the urgent care on 08/09/2022 for this and given a 10-day course of Keflex and mupirocin ointment.  States the redness and swelling have much improved with antibiotics.  Has not been using the mupirocin ointment due to stinging.  On exam ulcer appears to be healing well without signs of infection.  Can discontinue her antibiotics and ointment.  Follow-up precautions given for worsening of her wound.

## 2022-09-09 NOTE — Progress Notes (Signed)
Internal Medicine Clinic Attending ? ?Case discussed with Dr. Liang  At the time of the visit.  We reviewed the resident?s history and exam and pertinent patient test results.  I agree with the assessment, diagnosis, and plan of care documented in the resident?s note. ? ?

## 2022-09-13 ENCOUNTER — Encounter: Payer: Self-pay | Admitting: Pharmacist

## 2022-09-13 ENCOUNTER — Ambulatory Visit: Payer: BC Managed Care – PPO | Attending: Family Medicine | Admitting: Pharmacist

## 2022-09-13 VITALS — BP 114/71 | HR 92

## 2022-09-13 DIAGNOSIS — I10 Essential (primary) hypertension: Secondary | ICD-10-CM

## 2022-09-13 NOTE — Progress Notes (Signed)
   S:    PCP: Dr. Laural Benes  57 y.o. female who presents for hypertension evaluation, education, and management. PMH is significant for HTN, T2DM, OA. Patient was referred and last seen by Primary Care Provider, Dr. Laural Benes, on 05/27/2022. We saw her 08/12/2022 and added spironolactone. Her BP at that visit was 136/79 mmHg.   Of note, her BP was elevated at a visit with [facility] IM clinic 09/03/2022. She had not started her spironolactone prior to that visit. She was going to reestablish with IM but wants to keep her care here for now.   Today, patient arrives in spirits and presents without assistance. Denies dizziness, headache, blurred vision, swelling. She has started and is now taking the spironolactone daily. Denies side effects. She continues to be adherent with amlodipine and olmesartan-HCTZ. She has taken all medications today.    Current antihypertensives include: amlodipine 10 mg daily, olmesartan-HCTZ 40-25 mg daily, spironolactone 25 mg daily   Reported home BP readings:  -none to report since her last visit with [place]  O:  Vitals:   09/13/22 1604  BP: 114/71  Pulse: 92    Last 3 Office BP readings: BP Readings from Last 3 Encounters:  09/13/22 114/71  09/03/22 (!) 173/87  08/12/22 136/79    BMET    Component Value Date/Time   NA 140 05/27/2022 1450   K 3.8 05/27/2022 1450   CL 99 05/27/2022 1450   CO2 25 05/27/2022 1450   GLUCOSE 131 (H) 05/27/2022 1450   GLUCOSE 314 (H) 03/20/2020 1645   BUN 18 05/27/2022 1450   CREATININE 0.80 05/27/2022 1450   CREATININE 0.78 03/12/2016 1249   CALCIUM 10.1 05/27/2022 1450   GFRNONAA 67 03/31/2017 1614   GFRNONAA >89 11/03/2014 1258   GFRAA 77 03/31/2017 1614   GFRAA >89 11/03/2014 1258    Renal function: CrCl cannot be calculated (Patient's most recent lab result is older than the maximum 21 days allowed.).  Clinical ASCVD: No  The 10-year ASCVD risk score (Arnett DK, et al., 2019) is: 8%   Values used to calculate the  score:     Age: 63 years     Sex: Female     Is Non-Hispanic African American: Yes     Diabetic: Yes     Tobacco smoker: No     Systolic Blood Pressure: 114 mmHg     Is BP treated: Yes     HDL Cholesterol: 61 mg/dL     Total Cholesterol: 209 mg/dL   A/P: Hypertension longstanding, currently at goal on current medications. Medication adherence is optimal. We will get a BMP today to evaluate renal function and K. BP goal < 130/80 mmHg.  -Continued spironolactone 25 mg daily. Checking BMP today.  -Continued amlodipine 10 mg daily and olmesartan-HCTZ 40-25 mg daily.   -Patient educated on purpose, proper use, and potential adverse effects of spironolactone.  -F/u labs ordered - BMP8+eGFR -Counseled on lifestyle modifications for blood pressure control including reduced dietary sodium, increased exercise, adequate sleep. -Encouraged patient to check BP at home and bring log of readings to next visit. Counseled on proper use of home BP cuff.   Results reviewed and written information provided.    Written patient instructions provided. Patient verbalized understanding of treatment plan.  Total time in face to face counseling 30 minutes.    Follow-up:  Pharmacist prn.  PCP 09/26/22.  Butch Penny, PharmD, P[place]y [place], CPP Clinical Pharmacist [facility] [phone]

## 2022-09-16 ENCOUNTER — Other Ambulatory Visit: Payer: Self-pay | Admitting: Internal Medicine

## 2022-09-17 ENCOUNTER — Other Ambulatory Visit: Payer: Self-pay | Admitting: Internal Medicine

## 2022-09-17 LAB — BMP8+EGFR
BUN/Creatinine Ratio: 20 (ref 9–23)
BUN: 16 mg/dL (ref 6–24)
CO2: 23 mmol/L (ref 20–29)
Calcium: 9.9 mg/dL (ref 8.7–10.2)
Chloride: 97 mmol/L (ref 96–106)
Creatinine, Ser: 0.8 mg/dL (ref 0.57–1.00)
Glucose: 161 mg/dL — ABNORMAL HIGH (ref 70–99)
Potassium: 3.3 mmol/L — ABNORMAL LOW (ref 3.5–5.2)
Sodium: 142 mmol/L (ref 134–144)
eGFR: 86 mL/min/{1.73_m2} (ref >59)

## 2022-09-17 MED ORDER — POTASSIUM CHLORIDE ER 8 MEQ PO TBCR: 8.0000 meq | EXTENDED_RELEASE_TABLET | Freq: Every day | ORAL | 1 refills | Status: DC

## 2022-09-20 ENCOUNTER — Other Ambulatory Visit: Payer: Self-pay | Admitting: Student

## 2022-09-20 DIAGNOSIS — N939 Abnormal uterine and vaginal bleeding, unspecified: Secondary | ICD-10-CM

## 2022-09-20 NOTE — Addendum Note (Signed)
Addended by: Quincy Simmonds on: 09/20/2022 11:18 AM   Modules accepted: Orders

## 2022-09-23 LAB — BMP8+EGFR

## 2022-09-26 ENCOUNTER — Encounter: Payer: Self-pay | Admitting: Internal Medicine

## 2022-09-26 ENCOUNTER — Ambulatory Visit: Payer: BC Managed Care – PPO | Attending: Internal Medicine | Admitting: Internal Medicine

## 2022-09-26 VITALS — BP 116/72 | HR 97 | Ht 64.0 in | Wt 200.2 lb

## 2022-09-26 DIAGNOSIS — I152 Hypertension secondary to endocrine disorders: Secondary | ICD-10-CM

## 2022-09-26 DIAGNOSIS — E119 Type 2 diabetes mellitus without complications: Secondary | ICD-10-CM

## 2022-09-26 DIAGNOSIS — E876 Hypokalemia: Secondary | ICD-10-CM

## 2022-09-26 DIAGNOSIS — L03116 Cellulitis of left lower limb: Secondary | ICD-10-CM | POA: Diagnosis not present

## 2022-09-26 DIAGNOSIS — N938 Other specified abnormal uterine and vaginal bleeding: Secondary | ICD-10-CM

## 2022-09-26 DIAGNOSIS — E1159 Type 2 diabetes mellitus with other circulatory complications: Secondary | ICD-10-CM

## 2022-09-26 DIAGNOSIS — E669 Obesity, unspecified: Secondary | ICD-10-CM

## 2022-09-26 DIAGNOSIS — E1169 Type 2 diabetes mellitus with other specified complication: Secondary | ICD-10-CM | POA: Diagnosis not present

## 2022-09-26 DIAGNOSIS — Z7985 Long-term (current) use of injectable non-insulin antidiabetic drugs: Secondary | ICD-10-CM

## 2022-09-26 DIAGNOSIS — Z7984 Long term (current) use of oral hypoglycemic drugs: Secondary | ICD-10-CM

## 2022-09-26 DIAGNOSIS — Z1211 Encounter for screening for malignant neoplasm of colon: Secondary | ICD-10-CM

## 2022-09-26 DIAGNOSIS — Z6834 Body mass index (BMI) 34.0-34.9, adult: Secondary | ICD-10-CM

## 2022-09-26 NOTE — Progress Notes (Signed)
Sore on left leg.
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
## 2022-09-27 LAB — MICROALBUMIN / CREATININE URINE RATIO
Creatinine, Urine: 123 mg/dL
Microalb/Creat Ratio: 111 mg/g creat — ABNORMAL HIGH (ref 0–29)
Microalbumin, Urine: 136.2 ug/mL

## 2022-09-30 ENCOUNTER — Ambulatory Visit: Payer: BC Managed Care – PPO | Attending: Internal Medicine

## 2022-09-30 ENCOUNTER — Other Ambulatory Visit: Payer: Self-pay | Admitting: Internal Medicine

## 2022-10-01 ENCOUNTER — Other Ambulatory Visit: Payer: Self-pay | Admitting: Internal Medicine

## 2022-10-01 LAB — BASIC METABOLIC PANEL
BUN/Creatinine Ratio: 27 — ABNORMAL HIGH (ref 9–23)
BUN: 20 mg/dL (ref 6–24)
CO2: 24 mmol/L (ref 20–29)
Calcium: 9.9 mg/dL (ref 8.7–10.2)
Chloride: 98 mmol/L (ref 96–106)
Creatinine, Ser: 0.73 mg/dL (ref 0.57–1.00)
Glucose: 131 mg/dL — ABNORMAL HIGH (ref 70–99)
Potassium: 4.3 mmol/L (ref 3.5–5.2)
Sodium: 136 mmol/L (ref 134–144)
eGFR: 96 mL/min/{1.73_m2} (ref >59)

## 2022-10-01 LAB — SPECIMEN STATUS REPORT

## 2022-10-18 ENCOUNTER — Encounter: Payer: Self-pay | Admitting: Internal Medicine

## 2022-10-22 ENCOUNTER — Ambulatory Visit (HOSPITAL_COMMUNITY): Payer: BC Managed Care – PPO

## 2022-10-24 ENCOUNTER — Other Ambulatory Visit: Payer: Self-pay | Admitting: Student

## 2022-10-24 DIAGNOSIS — E1169 Type 2 diabetes mellitus with other specified complication: Secondary | ICD-10-CM

## 2022-11-04 ENCOUNTER — Other Ambulatory Visit: Payer: Self-pay | Admitting: Internal Medicine

## 2022-11-04 ENCOUNTER — Telehealth: Payer: Self-pay

## 2022-11-04 DIAGNOSIS — I110 Hypertensive heart disease with heart failure: Secondary | ICD-10-CM

## 2022-11-04 DIAGNOSIS — I10 Essential (primary) hypertension: Secondary | ICD-10-CM

## 2022-11-04 MED ORDER — AMLODIPINE BESYLATE 10 MG PO TABS
10.0000 mg | ORAL_TABLET | Freq: Every day | ORAL | 1 refills | Status: DC
Start: 2022-11-04 — End: 2023-04-21

## 2022-11-04 MED ORDER — OLMESARTAN MEDOXOMIL-HCTZ 40-25 MG PO TABS
1.0000 | ORAL_TABLET | Freq: Every day | ORAL | 3 refills | Status: DC
Start: 2022-11-04 — End: 2023-04-21

## 2022-11-05 NOTE — Telephone Encounter (Signed)
Requested Prescriptions  Refused Prescriptions Disp Refills   amLODipine (NORVASC) 10 MG tablet [Pharmacy Med Name: AMLODIPINE BESYLATE 10MG  TABLETS] 90 tablet 3    Sig: TAKE 1 TABLET(10 MG) BY MOUTH DAILY     Cardiovascular: Calcium Channel Blockers 2 Passed - 11/05/2022  3:45 PM      Passed - Last BP in normal range    BP Readings from Last 1 Encounters:  09/26/22 116/72         Passed - Last Heart Rate in normal range    Pulse Readings from Last 1 Encounters:  09/26/22 97         Passed - Valid encounter within last 6 months    Recent Outpatient Visits           1 month ago Type 2 diabetes mellitus with obesity (HCC)   [facility] Marcine Matar, MD   1 month ago Essential hypertension   [facility] [place], [place] L, RPH-CPP   2 months ago Hypertension associated with type 2 diabetes mellitus [facility])   [facility] [place], [place] L, RPH-CPP   5 months ago Type 2 diabetes mellitus with morbid obesity [facility])   [place] Marcine Matar, MD   3 years ago Dysuria   [facility] [place], Eilleen Kempf, MD       Future Appointments             In 2 months Laural Benes, Binnie Rail, MD [facility]

## 2022-11-05 NOTE — Telephone Encounter (Signed)
Sent to pcp

## 2023-01-23 ENCOUNTER — Other Ambulatory Visit: Payer: Self-pay | Admitting: Internal Medicine

## 2023-01-28 ENCOUNTER — Ambulatory Visit: Payer: BC Managed Care – PPO | Admitting: Internal Medicine

## 2023-03-10 ENCOUNTER — Other Ambulatory Visit: Payer: Self-pay | Admitting: Internal Medicine

## 2023-03-10 DIAGNOSIS — Z1231 Encounter for screening mammogram for malignant neoplasm of breast: Secondary | ICD-10-CM

## 2023-04-04 ENCOUNTER — Ambulatory Visit
Admission: RE | Admit: 2023-04-04 | Discharge: 2023-04-04 | Disposition: A | Discharge status: Home / Self Care | Payer: BC Managed Care – PPO | Source: Ambulatory Visit | Attending: Internal Medicine | Admitting: Internal Medicine

## 2023-04-04 DIAGNOSIS — Z1231 Encounter for screening mammogram for malignant neoplasm of breast: Secondary | ICD-10-CM

## 2023-04-11 ENCOUNTER — Ambulatory Visit (HOSPITAL_COMMUNITY): Payer: BC Managed Care – PPO

## 2023-04-16 NOTE — Progress Notes (Signed)
[facility] - Clinic Note  04/23/2023   CHIEF COMPLAINT Patient presents for Retina Evaluation  HISTORY OF PRESENT ILLNESS: [name] is a 57 y.o. female who presents to the clinic today for:  HPI     Retina Evaluation   In both eyes.  This started 2 weeks ago.  Duration of 2 weeks.  Context:  near vision.  I, the attending physician,  performed the HPI with the patient and updated documentation appropriately.        Comments   Retina eval per Mathis Fare for DME pt reports she was at Dr Lucious Groves  2 weeks ago and they saw some swelling and wanted her to get it checked she denies any vision changes she has some floaters but denies any flashes last reading was 172 A1C 8.8      Last edited by Rennis Chris, MD on 04/23/2023 12:17 PM.    Pt is here on the referral of Alma Downs, PA-C for concern of DME, pt states she has noticed that she needs her reading glasses all the time to see to read or for her computer, pt states she was dx with diabetes 26 years ago, she is on metformin and was supposed to be on ozempic, but she did not want to take that, so she stopped, her A1c was8.8 on 12.09.24, she states she has not seen her endocrinologist since May, pt has no personal hx of heart attack or stroke, but states "everyone" in her family has had a stroke, her mother just had one   Referring physician: Alma Downs, PA-C  [address] [address] [address] [place],  [place] [address]  HISTORICAL INFORMATION:  Selected notes from the MEDICAL RECORD NUMBER Referred by Alma Downs, PA-C for DM exam LEE:  Ocular Hx- PMH-   CURRENT MEDICATIONS: No current outpatient medications on file. (Ophthalmic Drugs)   No current facility-administered medications for this visit. (Ophthalmic Drugs)   Current Outpatient Medications (Other)  Medication Sig   amLODipine (NORVASC) 10 MG tablet Take 1 tablet (10 mg total) by mouth daily.   ferrous sulfate  (FEROSUL) 325 (65 FE) MG tablet Take 1 tablet (325 mg total) by mouth 2 (two) times daily with a meal. TAKE 1 TABLET(325 MG) BY MOUTH TWICE DAILY WITH A MEAL   glucose blood test strip 1 each by Other route daily before breakfast. Use 1 strip daily to check fasting blood glucose   metFORMIN (GLUCOPHAGE-XR) 500 MG 24 hr tablet Take 2 tablets (1,000 mg total) by mouth daily with breakfast.   olmesartan-hydrochlorothiazide (BENICAR HCT) 40-25 MG tablet Take 1 tablet by mouth daily.   rosuvastatin (CRESTOR) 10 MG tablet Take 1 tablet (10 mg total) by mouth daily.   Semaglutide,0.25 or 0.5MG /DOS, (OZEMPIC, 0.25 OR 0.5 MG/DOSE,) 2 MG/3ML SOPN Inject 0.5 mg into the skin once a week.   spironolactone (ALDACTONE) 25 MG tablet Take 1 tablet (25 mg total) by mouth once for 1 dose.   No current facility-administered medications for this visit. (Other)   REVIEW OF SYSTEMS: ROS   Positive for: Endocrine, Eyes Last edited by Etheleen Mayhew, COT on 04/23/2023  9:06 AM.     ALLERGIES No Known Allergies PAST MEDICAL HISTORY Past Medical History:  Diagnosis Date   Anemia    CHF (congestive heart failure) (HCC)    Diabetes mellitus    Heart murmur    Hypertension    Pneumonia    Past Surgical History:  Procedure Laterality Date   CESAREAN SECTION     TUBAL LIGATION     FAMILY HISTORY Family History  Problem Relation Age of Onset   Hypertension Mother    Hypertension Brother    Diabetes Neg Hx    SOCIAL HISTORY Social History   Tobacco Use   Smoking status: Never   Smokeless tobacco: Never  Vaping Use   Vaping status: Never Used  Substance Use Topics   Alcohol use: Yes    Comment: occasional   Drug use: No       OPHTHALMIC EXAM:  Base Eye Exam     Visual Acuity (Snellen - Linear)       Right Left   Dist [place] 20/30 20/20   Dist ph [place] 20/25          Tonometry (Tonopen, 9:12 AM)       Right Left   Pressure 19 18  Squeezing         Pupils       Pupils Dark  Light Shape React APD   Right PERRL 3 2 Round Brisk None   Left PERRL 3 2 Round Brisk None         Visual Fields       Left Right    Full Full         Extraocular Movement       Right Left    Full, Ortho Full, Ortho         Neuro/Psych     Oriented x3: Yes   Mood/Affect: Normal         Dilation     Both eyes: 2.5% Phenylephrine @ 9:12 AM           Slit Lamp and Fundus Exam     Slit Lamp Exam       Right Left   Lids/Lashes Normal Normal   Conjunctiva/Sclera mild melanosis, mild nasal pingeucula mild melanosis   Cornea Clear trace PEE, trace tear film debris   Anterior Chamber deep, clear, narrow temporal angles deep, clear, narrow temporal angles   Iris Round and dilated, No NVI Round and dilated, No NVI   Lens 2+ Nuclear sclerosis, 2+ Cortical cataract 2+ Nuclear sclerosis, 2+ Cortical cataract   Anterior Vitreous mild syneresis mild syneresis         Fundus Exam       Right Left   Disc Pink and Sharp Pink and Sharp, +cupping   C/D Ratio 0.65 0.7   Macula Flat, Good foveal reflex, focal cluster of MA, punctate exudates, cystic changes SN fovea Flat, Good foveal reflex, scattered MA and punctate exudates ST macula   Vessels attenuated, Tortuous dilated and tortuous venules, attenuated arterioles with +copper wiring, AV crossing changes, fine peripheral NV SN and temporal periphery   Periphery Attached, rare MA Attached, focal clusters of MA/DBH temporal and superior periphery           IMAGING AND PROCEDURES  Imaging and Procedures for 04/23/2023  OCT, Retina - OU - Both Eyes       Right Eye Quality was good. Central Foveal Thickness: 310. Progression has no prior data. Findings include no SRF, abnormal foveal contour, intraretinal hyper-reflective material, intraretinal fluid, vitreomacular adhesion (+cystic changes/edema SN fovea and macula).   Left Eye Quality was good. Central Foveal Thickness: 289. Progression has no prior data.  Findings include no SRF, abnormal foveal contour, retinal drusen , intraretinal fluid, vitreomacular adhesion (Focal IRF/cystic changes superior fovea and macula).  Notes *Images captured and stored on drive  Diagnosis / Impression:  +DME OU OD: +cystic changes/edema SN fovea and macula OS: Focal IRF/cystic changes superior fovea and macula  Clinical management:  See below  Abbreviations: NFP - Normal foveal profile. CME - cystoid macular edema. PED - pigment epithelial detachment. IRF - intraretinal fluid. SRF - subretinal fluid. EZ - ellipsoid zone. ERM - epiretinal membrane. ORA - outer retinal atrophy. ORT - outer retinal tubulation. SRHM - subretinal hyper-reflective material. IRHM - intraretinal hyper-reflective material      Fluorescein Angiography Optos (Transit OS)       Right Eye Progression has no prior data. Early phase findings include microaneurysm. Mid/Late phase findings include leakage, microaneurysm (Scattered MA with leakage greatest SN fovea, no NV).   Left Eye Progression has no prior data. Early phase findings include delayed filling, microaneurysm, vascular perfusion defect (Delayed filling and delayed venous return). Mid/Late phase findings include leakage, microaneurysm, retinal neovascularization, vascular perfusion defect (Focal vascular perfusion defects and NV SN and temporal periphery, leaking MA greatest temporal periphery).   Notes **Images stored on drive**  Impression: OD: Scattered MA with leakage greatest SN fovea, no NV -- NPDR OS: Focal vascular perfusion defects and NV SN and temporal periphery, leaking MA greatest temporal periphery -- PDR           ASSESSMENT/PLAN:   ICD-10-CM   1. Proliferative diabetic retinopathy of left eye with macular edema associated with type 2 diabetes mellitus (HCC)  E11.3512 OCT, Retina - OU - Both Eyes    Fluorescein Angiography Optos (Transit OS)    2. Moderate nonproliferative diabetic retinopathy of  right eye with macular edema associated with type 2 diabetes mellitus (HCC)  E11.3311 OCT, Retina - OU - Both Eyes    Fluorescein Angiography Optos (Transit OS)    3. Long term (current) use of oral hypoglycemic drugs  Z79.84     4. Essential hypertension  I10     5. Hypertensive retinopathy of both eyes  H35.033 Fluorescein Angiography Optos (Transit OS)    6. Combined forms of age-related cataract of both eyes  H25.813      1,2. Proliferative diabetic retinopathy w/o DME, left eye  - most recent A1c 8.8 on 12.9.24 -- history of poor BG control when mother became sick - The incidence, risk factors for progression, natural history and treatment options for diabetic retinopathy were discussed with patient.   - The need for close monitoring of blood glucose, blood pressure, and serum lipids, avoiding cigarette or any type of tobacco, and the need for long term follow up was also discussed with patient. - exam shows scattered MA and punctate exudates ST mac and focal clusters of MA/DBH temporal and superior periphery - FA (12.11.24) shows OS: Focal vascular perfusion defects and NV SN and temporal periphery, leaking MA greatest temporal periphery - OCT shows focal IRF/cystic changes superior fovea and macula - recommend IVA OS #1 today for PDR and DME, but pt wishes to come back on Friday, December 13th for first injection - f/u Friday December 13 -- DFE/OCT, IVA OS  3. Moderate Non-proliferative diabetic retinopathy, right eye - The incidence, risk factors for progression, natural history and treatment options for diabetic retinopathy were discussed with patient.   - The need for close monitoring of blood glucose, blood pressure, and serum lipids, avoiding cigarette or any type of tobacco, and the need for long term follow up was also discussed with patient. - exam shows focal cluster of  MA and punctate exudates - FA (12.11.24) shows UE:[id number] MA with leakage greatest SN fovea, no NV -  OCT shows +cystic changes/edema SN fovea and macula - The natural history, pathology, and characteristics of diabetic macular edema discussed with patient.  A generalized discussion of the major clinical trials concerning treatment of diabetic macular edema (ETDRS, DCT, SCORE, RISE / RIDE, and ongoing DRCR net studies) was completed.  This discussion included mention of the various approaches to treating diabetic macular edema (observation, laser photocoagulation, anti-VEGF injections with lucentis / Avastin / Eylea, steroid injections with Kenalog / Ozurdex, and intraocular surgery with vitrectomy).  The goal hemoglobin A1C of 6-7 was discussed, as well as importance of smoking cessation and hypertension control.  Need for ongoing treatment and monitoring were specifically discussed with reference to chronic nature of diabetic macular edema. - recommend IVA OD on Monday, December 16 - f/u Friday, December 13 -- DFE/OCT, possible injection OS  4,5. Hypertensive retinopathy OU - discussed importance of tight BP control - monitor  6. Mixed Cataract OU - The symptoms of cataract, surgical options, and treatments and risks were discussed with patient. - discussed diagnosis and progression - not yet visually significant - monitor for now  Ophthalmic Meds Ordered this visit:  No orders of the defined types were placed in this encounter.    Return in about 2 days (around 04/25/2023) for f/u NPDR OD, DFE, OCT, Possible Injxn.  There are no Patient Instructions on file for this visit.  Explained the diagnoses, plan, and follow up with the patient and they expressed understanding.  Patient expressed understanding of the importance of proper follow up care.   This document serves as a record of services personally performed by Karie Chimera, MD, PhD. It was created on their behalf by De Blanch, an ophthalmic technician. The creation of this record is the provider's dictation and/or activities  during the visit.    Electronically signed by: De Blanch, OA, 04/23/23  12:20 PM  This document serves as a record of services personally performed by Karie Chimera, MD, PhD. It was created on their behalf by Glee Arvin. Manson Passey, OA an ophthalmic technician. The creation of this record is the provider's dictation and/or activities during the visit.    Electronically signed by: Glee Arvin. Manson Passey, OA 04/23/23 12:20 PM  Karie Chimera, M.D., Ph.D. Diseases & Surgery of the Retina and Vitreous [facility] 04/23/2023  I have reviewed the above documentation for accuracy and completeness, and I agree with the above. Karie Chimera, M.D., Ph.D. 04/23/23 12:45 PM   Abbreviations: M myopia (nearsighted); A astigmatism; H hyperopia (farsighted); P presbyopia; Mrx spectacle prescription;  CTL contact lenses; OD right eye; OS left eye; OU both eyes  XT exotropia; ET esotropia; PEK punctate epithelial keratitis; PEE punctate epithelial erosions; DES dry eye syndrome; MGD meibomian gland dysfunction; ATs artificial tears; PFAT's preservative free artificial tears; NSC nuclear sclerotic cataract; PSC posterior subcapsular cataract; ERM epi-retinal membrane; PVD posterior vitreous detachment; RD retinal detachment; DM diabetes mellitus; DR diabetic retinopathy; NPDR non-proliferative diabetic retinopathy; PDR proliferative diabetic retinopathy; CSME clinically significant macular edema; DME diabetic macular edema; dbh dot blot hemorrhages; CWS cotton wool spot; POAG primary open angle glaucoma; C/D cup-to-disc ratio; HVF humphrey visual field; GVF goldmann visual field; OCT optical coherence tomography; IOP intraocular pressure; BRVO Branch retinal vein occlusion; CRVO central retinal vein occlusion; CRAO central retinal artery occlusion; BRAO branch retinal artery occlusion; RT retinal tear; SB scleral  buckle; PPV pars plana vitrectomy; VH Vitreous hemorrhage; PRP panretinal laser  photocoagulation; IVK intravitreal kenalog; VMT vitreomacular traction; MH Macular hole;  NVD neovascularization of the disc; NVE neovascularization elsewhere; AREDS age related eye disease study; ARMD age related macular degeneration; POAG primary open angle glaucoma; EBMD epithelial/anterior basement membrane dystrophy; ACIOL anterior chamber intraocular lens; IOL intraocular lens; PCIOL posterior chamber intraocular lens; Phaco/IOL phacoemulsification with intraocular lens placement; PRK photorefractive keratectomy; LASIK laser assisted in situ keratomileusis; HTN hypertension; DM diabetes mellitus; COPD chronic obstructive pulmonary disease

## 2023-04-21 ENCOUNTER — Ambulatory Visit: Payer: BC Managed Care – PPO | Attending: Nurse Practitioner | Admitting: Nurse Practitioner

## 2023-04-21 ENCOUNTER — Encounter: Payer: Self-pay | Admitting: Nurse Practitioner

## 2023-04-21 VITALS — BP 154/86 | HR 86 | Ht 64.0 in | Wt 195.4 lb

## 2023-04-21 DIAGNOSIS — Z1211 Encounter for screening for malignant neoplasm of colon: Secondary | ICD-10-CM | POA: Diagnosis not present

## 2023-04-21 DIAGNOSIS — E785 Hyperlipidemia, unspecified: Secondary | ICD-10-CM

## 2023-04-21 DIAGNOSIS — I110 Hypertensive heart disease with heart failure: Secondary | ICD-10-CM

## 2023-04-21 DIAGNOSIS — D500 Iron deficiency anemia secondary to blood loss (chronic): Secondary | ICD-10-CM

## 2023-04-21 DIAGNOSIS — E1169 Type 2 diabetes mellitus with other specified complication: Secondary | ICD-10-CM

## 2023-04-21 DIAGNOSIS — E669 Obesity, unspecified: Secondary | ICD-10-CM

## 2023-04-21 DIAGNOSIS — Z7984 Long term (current) use of oral hypoglycemic drugs: Secondary | ICD-10-CM

## 2023-04-21 MED ORDER — OLMESARTAN MEDOXOMIL-HCTZ 40-25 MG PO TABS
1.0000 | ORAL_TABLET | Freq: Every day | ORAL | 1 refills | Status: DC
Start: 2023-04-21 — End: 2023-07-21

## 2023-04-21 MED ORDER — METFORMIN HCL ER 500 MG PO TB24: 1000.0000 mg | ORAL_TABLET | Freq: Every day | ORAL | 1 refills | Status: DC

## 2023-04-21 MED ORDER — OZEMPIC (0.25 OR 0.5 MG/DOSE) 2 MG/3ML ~~LOC~~ SOPN
0.5000 mg | PEN_INJECTOR | SUBCUTANEOUS | 1 refills | Status: DC
Start: 2023-04-21 — End: 2023-07-21

## 2023-04-21 MED ORDER — SPIRONOLACTONE 25 MG PO TABS: 25.0000 mg | ORAL_TABLET | Freq: Once | ORAL | 1 refills | Status: DC

## 2023-04-21 MED ORDER — ROSUVASTATIN CALCIUM 10 MG PO TABS
10.0000 mg | ORAL_TABLET | Freq: Every day | ORAL | 1 refills | Status: DC
Start: 2023-04-21 — End: 2023-07-21

## 2023-04-21 MED ORDER — AMLODIPINE BESYLATE 10 MG PO TABS: 10.0000 mg | ORAL_TABLET | Freq: Every day | ORAL | 1 refills | Status: DC

## 2023-04-21 MED ORDER — FERROUS SULFATE 325 (65 FE) MG PO TABS: 325.0000 mg | ORAL_TABLET | Freq: Two times a day (BID) | ORAL | 1 refills | Status: AC

## 2023-04-21 NOTE — Progress Notes (Signed)
Assessment & Plan:  [name] was seen today for medical management of chronic issues.  Diagnoses and all orders for this visit:  Type 2 diabetes mellitus with obesity (HCC) -     Hemoglobin A1c -     Ambulatory referral to Gastroenterology -     Semaglutide,0.25 or 0.5MG /DOS, (OZEMPIC, 0.25 OR 0.5 MG/DOSE,) 2 MG/3ML SOPN; Inject 0.5 mg into the skin once a week. -     metFORMIN (GLUCOPHAGE-XR) 500 MG 24 hr tablet; Take 2 tablets (1,000 mg total) by mouth daily with breakfast. -     CMP14+EGFR  Hypertensive heart disease with heart failure (HCC) -     olmesartan-hydrochlorothiazide (BENICAR HCT) 40-25 MG tablet; Take 1 tablet by mouth daily. -     spironolactone (ALDACTONE) 25 MG tablet; Take 1 tablet (25 mg total) by mouth once for 1 dose. -     amLODipine (NORVASC) 10 MG tablet; Take 1 tablet (10 mg total) by mouth daily.  Hyperlipidemia associated with type 2 diabetes mellitus (HCC) -     rosuvastatin (CRESTOR) 10 MG tablet; Take 1 tablet (10 mg total) by mouth daily.  Colon cancer screening -     Hemoglobin A1c  Iron deficiency anemia due to chronic blood loss -     ferrous sulfate (FEROSUL) 325 (65 FE) MG tablet; Take 1 tablet (325 mg total) by mouth 2 (two) times daily with a meal. TAKE 1 TABLET(325 MG) BY MOUTH TWICE DAILY WITH A MEAL -     CBC with Differential    Patient has been counseled on age-appropriate routine health concerns for screening and prevention. These are reviewed and up-to-date. Referrals have been placed accordingly. Immunizations are up-to-date or declined.    Subjective:   Chief Complaint  Patient presents with   Medical Management of Chronic Issues    [name] 57 y.o. female presents to office today for follow up to DM and HTN She is a patient of Dr. Laural Benes  Notes persistent hot flashes over the past few weeks. She declines prescription management today.   She has a past medical history of Anemia, CHF, DM2, Heart murmur, Hypertension, and  Pneumonia.     DM 2 Blood glucose levels are elevated at home. A1c is above goal. Diabetes is not controlled.  She would like to start back on Ozempic. She is no longer seeing endocrinology. Wants to know if she should return to seeing them. I have advised her to do so.  Lab Results  Component Value Date   HGBA1C 8.6 (A) 09/03/2022  LDL not at goal. She is prescribed rosuvastatin 10 mg daily. Lab Results  Component Value Date   LDLCALC 128 (H) 05/27/2022      HTN Blood pressure is elevated. She is currently prescribed amlodipine 10 mg daily, benicar-hct 40-25 mg daily and spironolactone 25 mg daily.  BP Readings from Last 3 Encounters:  04/21/23 (!) 154/86  09/26/22 116/72  09/13/22 114/71     Review of Systems  Constitutional:  Negative for fever, malaise/fatigue and weight loss.       Hot flashes  HENT: Negative.  Negative for nosebleeds.   Eyes: Negative.  Negative for blurred vision, double vision and photophobia.  Respiratory: Negative.  Negative for cough and shortness of breath.   Cardiovascular: Negative.  Negative for chest pain, palpitations and leg swelling.  Gastrointestinal: Negative.  Negative for heartburn, nausea and vomiting.  Musculoskeletal: Negative.  Negative for myalgias.  Neurological: Negative.  Negative for dizziness, focal  weakness, seizures and headaches.  Psychiatric/Behavioral: Negative.  Negative for suicidal ideas.     Past Medical History:  Diagnosis Date   Anemia    CHF (congestive heart failure) (HCC)    Diabetes mellitus    Heart murmur    Hypertension    Pneumonia     Past Surgical History:  Procedure Laterality Date   CESAREAN SECTION     TUBAL LIGATION      Family History  Problem Relation Age of Onset   Hypertension Mother    Hypertension Brother    Diabetes Neg Hx     Social History Reviewed with no changes to be made today.   Outpatient Medications Prior to Visit  Medication Sig Dispense Refill   glucose blood test  strip 1 each by Other route daily before breakfast. Use 1 strip daily to check fasting blood glucose 100 each 12   amLODipine (NORVASC) 10 MG tablet Take 1 tablet (10 mg total) by mouth daily. 90 tablet 1   ferrous sulfate (FEROSUL) 325 (65 FE) MG tablet TAKE 1 TABLET(325 MG) BY MOUTH TWICE DAILY WITH A MEAL 180 tablet 2   metFORMIN (GLUCOPHAGE-XR) 500 MG 24 hr tablet TAKE 2 TABLETS(1000 MG) BY MOUTH DAILY WITH BREAKFAST 180 tablet 3   olmesartan-hydrochlorothiazide (BENICAR HCT) 40-25 MG tablet Take 1 tablet by mouth daily. 90 tablet 3   rosuvastatin (CRESTOR) 10 MG tablet Take 1 tablet (10 mg total) by mouth daily. 30 tablet 11   spironolactone (ALDACTONE) 25 MG tablet TAKE 1 TABLET(25 MG) BY MOUTH DAILY 90 tablet 1   Semaglutide,0.25 or 0.5MG /DOS, (OZEMPIC, 0.25 OR 0.5 MG/DOSE,) 2 MG/3ML SOPN Inject 0.5 mg into the skin once a week. (Patient not taking: Reported on 04/21/2023) 3 mL 11   No facility-administered medications prior to visit.    No Known Allergies     Objective:    BP (!) 154/86   Pulse 86   Ht 5\' 4"  (1.626 m)   Wt 195 lb 6.4 oz (88.6 kg)   LMP 11/11/2022 (Approximate)   SpO2 100%   BMI 33.54 kg/m  Wt Readings from Last 3 Encounters:  04/21/23 195 lb 6.4 oz (88.6 kg)  09/26/22 200 lb 3.2 oz (90.8 kg)  09/03/22 205 lb 4.8 oz (93.1 kg)    Physical Exam Vitals and nursing note reviewed.  Constitutional:      Appearance: She is well-developed.  HENT:     Head: Normocephalic and atraumatic.  Cardiovascular:     Rate and Rhythm: Normal rate and regular rhythm.     Heart sounds: Normal heart sounds. No murmur heard.    No friction rub. No gallop.  Pulmonary:     Effort: Pulmonary effort is normal. No tachypnea or respiratory distress.     Breath sounds: Normal breath sounds. No decreased breath sounds, wheezing, rhonchi or rales.  Chest:     Chest wall: No tenderness.  Abdominal:     General: Bowel sounds are normal.     Palpations: Abdomen is soft.   Musculoskeletal:        General: Normal range of motion.     Cervical back: Normal range of motion.  Skin:    General: Skin is warm and dry.  Neurological:     Mental Status: She is alert and oriented to person, place, and time.     Coordination: Coordination normal.  Psychiatric:        Behavior: Behavior normal. Behavior is cooperative.  Thought Content: Thought content normal.        Judgment: Judgment normal.          Patient has been counseled extensively about nutrition and exercise as well as the importance of adherence with medications and regular follow-up. The patient was given clear instructions to go to ER or return to medical center if symptoms don't improve, worsen or new problems develop. The patient verbalized understanding.   Follow-up: Return in about 3 months (around 07/20/2023) for f/u with PCP Laural Benes.   Claiborne Rigg, FNP-BC [facility] [place], [place] [phone]   04/21/2023, 8:41 PM

## 2023-04-21 NOTE — Patient Instructions (Signed)
[facility] Gastroenterologist in [place], [place] Address: [address] [place], [place], [place] [address] Phone: [phone]

## 2023-04-21 NOTE — Progress Notes (Signed)
Patient states that she has been experiencing hot flashes.

## 2023-04-22 LAB — CMP14+EGFR
ALT: 7 [IU]/L (ref 0–32)
AST: 19 [IU]/L (ref 0–40)
Albumin: 4.4 g/dL (ref 3.8–4.9)
Alkaline Phosphatase: 134 [IU]/L — ABNORMAL HIGH (ref 44–121)
BUN/Creatinine Ratio: 25 — ABNORMAL HIGH (ref 9–23)
BUN: 26 mg/dL — ABNORMAL HIGH (ref 6–24)
Bilirubin Total: <0.2 mg/dL (ref 0.0–1.2)
CO2: 22 mmol/L (ref 20–29)
Calcium: 9.6 mg/dL (ref 8.7–10.2)
Chloride: 100 mmol/L (ref 96–106)
Creatinine, Ser: 1.04 mg/dL — ABNORMAL HIGH (ref 0.57–1.00)
Globulin, Total: 3.4 g/dL (ref 1.5–4.5)
Glucose: 124 mg/dL — ABNORMAL HIGH (ref 70–99)
Potassium: 4.7 mmol/L (ref 3.5–5.2)
Sodium: 140 mmol/L (ref 134–144)
Total Protein: 7.8 g/dL (ref 6.0–8.5)
eGFR: 63 mL/min/{1.73_m2} (ref >59)

## 2023-04-22 LAB — CBC WITH DIFFERENTIAL/PLATELET
Basophils Absolute: 0.1 10*3/uL (ref 0.0–0.2)
Basos: 1 %
EOS (ABSOLUTE): 0.1 10*3/uL (ref 0.0–0.4)
Eos: 1 %
Hematocrit: 37.9 % (ref 34.0–46.6)
Hemoglobin: 12 g/dL (ref 11.1–15.9)
Immature Grans (Abs): 0 10*3/uL (ref 0.0–0.1)
Immature Granulocytes: 0 %
Lymphocytes Absolute: 2.8 10*3/uL (ref 0.7–3.1)
Lymphs: 37 %
MCH: 25.2 pg — ABNORMAL LOW (ref 26.6–33.0)
MCHC: 31.7 g/dL (ref 31.5–35.7)
MCV: 80 fL (ref 79–97)
Monocytes Absolute: 0.6 10*3/uL (ref 0.1–0.9)
Monocytes: 7 %
Neutrophils Absolute: 4.2 10*3/uL (ref 1.4–7.0)
Neutrophils: 54 %
Platelets: 301 10*3/uL (ref 150–450)
RBC: 4.76 x10E6/uL (ref 3.77–5.28)
RDW: 13.1 % (ref 11.7–15.4)
WBC: 7.7 10*3/uL (ref 3.4–10.8)

## 2023-04-22 LAB — HEMOGLOBIN A1C
Est. average glucose Bld gHb Est-mCnc: 206 mg/dL
Hgb A1c MFr Bld: 8.8 % — ABNORMAL HIGH (ref 4.8–5.6)

## 2023-04-23 ENCOUNTER — Ambulatory Visit (INDEPENDENT_AMBULATORY_CARE_PROVIDER_SITE_OTHER): Payer: BC Managed Care – PPO | Admitting: Ophthalmology

## 2023-04-23 ENCOUNTER — Encounter (INDEPENDENT_AMBULATORY_CARE_PROVIDER_SITE_OTHER): Payer: Self-pay | Admitting: Ophthalmology

## 2023-04-23 ENCOUNTER — Telehealth: Payer: Self-pay | Admitting: Internal Medicine

## 2023-04-23 VITALS — BP 147/90 | HR 84

## 2023-04-23 DIAGNOSIS — H35033 Hypertensive retinopathy, bilateral: Secondary | ICD-10-CM

## 2023-04-23 DIAGNOSIS — I10 Essential (primary) hypertension: Secondary | ICD-10-CM

## 2023-04-23 DIAGNOSIS — H3581 Retinal edema: Secondary | ICD-10-CM

## 2023-04-23 DIAGNOSIS — Z7984 Long term (current) use of oral hypoglycemic drugs: Secondary | ICD-10-CM

## 2023-04-23 DIAGNOSIS — H25813 Combined forms of age-related cataract, bilateral: Secondary | ICD-10-CM

## 2023-04-23 DIAGNOSIS — E113311 Type 2 diabetes mellitus with moderate nonproliferative diabetic retinopathy with macular edema, right eye: Secondary | ICD-10-CM

## 2023-04-23 DIAGNOSIS — E113512 Type 2 diabetes mellitus with proliferative diabetic retinopathy with macular edema, left eye: Secondary | ICD-10-CM

## 2023-04-23 NOTE — Telephone Encounter (Signed)
Patient had diabetic eye exam done today by Dr. Vanessa Barbara.  Please see note in EMR.  She has diabetic retinopathy in both eyes.  Please update health maintenance.

## 2023-04-23 NOTE — Telephone Encounter (Addendum)
Information has been updated

## 2023-04-24 NOTE — Progress Notes (Signed)
[facility] - Clinic Note  04/25/2023   CHIEF COMPLAINT Patient presents for Retina Follow Up  HISTORY OF PRESENT ILLNESS: [name] is a 57 y.o. female who presents to the clinic today for:  HPI     Retina Follow Up   In left eye.  Duration of 2 days.        Comments   Patient states the vision is the same. She is not using eye drops yet. She has not checked her blood sugar.       Last edited by Charlette Caffey, COT on 04/25/2023  9:18 AM.     Pt is here on the referral of Alma Downs, PA-C for concern of DME, pt states she has noticed that she needs her reading glasses all the time to see to read or for her computer, pt states she was dx with diabetes 26 years ago, she is on metformin and was supposed to be on ozempic, but she did not want to take that, so she stopped, her A1c was8.8 on 12.09.24, she states she has not seen her endocrinologist since May, pt has no personal hx of heart attack or stroke, but states "everyone" in her family has had a stroke, her mother just had one   Referring physician: Alma Downs, PA-C  [address] [address] [place],  [place] [address]  HISTORICAL INFORMATION:  Selected notes from the MEDICAL RECORD NUMBER Referred by Alma Downs, PA-C for DM exam LEE:  Ocular Hx- PMH-   CURRENT MEDICATIONS: No current outpatient medications on file. (Ophthalmic Drugs)   No current facility-administered medications for this visit. (Ophthalmic Drugs)   Current Outpatient Medications (Other)  Medication Sig   amLODipine (NORVASC) 10 MG tablet Take 1 tablet (10 mg total) by mouth daily.   ferrous sulfate (FEROSUL) 325 (65 FE) MG tablet Take 1 tablet (325 mg total) by mouth 2 (two) times daily with a meal. TAKE 1 TABLET(325 MG) BY MOUTH TWICE DAILY WITH A MEAL   glucose blood test strip 1 each by Other route daily before breakfast. Use 1 strip daily to check fasting blood glucose    metFORMIN (GLUCOPHAGE-XR) 500 MG 24 hr tablet Take 2 tablets (1,000 mg total) by mouth daily with breakfast.   olmesartan-hydrochlorothiazide (BENICAR HCT) 40-25 MG tablet Take 1 tablet by mouth daily.   rosuvastatin (CRESTOR) 10 MG tablet Take 1 tablet (10 mg total) by mouth daily.   Semaglutide,0.25 or 0.5MG /DOS, (OZEMPIC, 0.25 OR 0.5 MG/DOSE,) 2 MG/3ML SOPN Inject 0.5 mg into the skin once a week.   spironolactone (ALDACTONE) 25 MG tablet Take 1 tablet (25 mg total) by mouth once for 1 dose.   No current facility-administered medications for this visit. (Other)   REVIEW OF SYSTEMS: ROS   Positive for: Endocrine, Eyes Last edited by Charlette Caffey, COT on 04/25/2023  9:18 AM.      ALLERGIES No Known Allergies PAST MEDICAL HISTORY Past Medical History:  Diagnosis Date   Anemia    CHF (congestive heart failure) (HCC)    Diabetes mellitus    Heart murmur    Hypertension    Pneumonia    Past Surgical History:  Procedure Laterality Date   CESAREAN SECTION     TUBAL LIGATION     FAMILY HISTORY Family History  Problem Relation Age of Onset   Hypertension Mother    Hypertension Brother    Diabetes Neg Hx    SOCIAL  HISTORY Social History   Tobacco Use   Smoking status: Never   Smokeless tobacco: Never  Vaping Use   Vaping status: Never Used  Substance Use Topics   Alcohol use: Yes    Comment: occasional   Drug use: No       OPHTHALMIC EXAM:  Base Eye Exam     Visual Acuity (Snellen - Linear)       Right Left   Dist [place] 20/30 20/20   Dist ph  20/25          Tonometry (Tonopen, 9:21 AM)       Right Left   Pressure 20 19         Pupils       Dark Light Shape React APD   Right 3 2 Round Brisk None   Left 3 2 Round Brisk None         Visual Fields       Left Right    Full Full         Extraocular Movement       Right Left    Full, Ortho Full, Ortho         Neuro/Psych     Oriented x3: Yes   Mood/Affect: Normal          Dilation     Left eye: 1.0% Mydriacyl, 2.5% Phenylephrine @ 9:18 AM           Slit Lamp and Fundus Exam     Slit Lamp Exam       Right Left   Lids/Lashes Normal Normal   Conjunctiva/Sclera mild melanosis, mild nasal pingeucula mild melanosis   Cornea Clear trace PEE, trace tear film debris   Anterior Chamber deep, clear, narrow temporal angles deep, clear, narrow temporal angles   Iris Round and dilated, No NVI Round and dilated, No NVI   Lens 2+ Nuclear sclerosis, 2+ Cortical cataract 2+ Nuclear sclerosis, 2+ Cortical cataract   Anterior Vitreous mild syneresis mild syneresis         Fundus Exam       Right Left   Disc Pink and Sharp Pink and Sharp, +cupping   C/D Ratio 0.65 0.7   Macula Flat, Good foveal reflex, focal cluster of MA, punctate exudates, cystic changes SN fovea Flat, Good foveal reflex, scattered MA and punctate exudates ST macula   Vessels attenuated, Tortuous dilated and tortuous venules, attenuated arterioles with +copper wiring, AV crossing changes, fine peripheral NV SN and temporal periphery   Periphery Attached, rare MA Attached, focal clusters of MA/DBH temporal and superior periphery           IMAGING AND PROCEDURES  Imaging and Procedures for 04/25/2023  Intravitreal Injection, Pharmacologic Agent - OS - Left Eye       Time Out 04/25/2023. 10:16 AM. Confirmed correct patient, procedure, site, and patient consented.   Anesthesia Topical anesthesia was used. Anesthetic medications included Lidocaine 4%, Proparacaine 0.5%.   Procedure Preparation included 5% betadine to ocular surface, eyelid speculum. A (32g) needle was used.   Injection: 1.25 mg Bevacizumab 1.25mg /0.68ml   Route: Intravitreal, Site: Left Eye   NDC: [id number], Lot: [id number], Expiration date: 07/21/2023   Post-op Post injection exam found visual acuity of at least counting fingers. The patient tolerated the procedure well. There were no complications. The  patient received written and verbal post procedure care education. Post injection medications were not given.            ASSESSMENT/PLAN:  ICD-10-CM   1. 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 Long term (current) use of oral hypoglycemic drugs  Z79.84     3. Moderate nonproliferative diabetic retinopathy of right eye with macular edema associated with type 2 diabetes mellitus (HCC)  E11.3311     4. Essential hypertension  I10     5. Hypertensive retinopathy of both eyes  H35.033     6. Combined forms of age-related cataract of both eyes  H25.813       1,2. Proliferative diabetic retinopathy w/o DME, left eye  - most recent A1c 8.8 on 12.9.24 -- history of poor BG control when mother became sick - The incidence, risk factors for progression, natural history and treatment options for diabetic retinopathy were discussed with patient.   - The need for close monitoring of blood glucose, blood pressure, and serum lipids, avoiding cigarette or any type of tobacco, and the need for long term follow up was also discussed with patient. - exam shows scattered MA and punctate exudates ST mac and focal clusters of MA/DBH temporal and superior periphery - FA (12.11.24) shows OS: Focal vascular perfusion defects and NV SN and temporal periphery, leaking MA greatest temporal periphery - OCT shows focal IRF/cystic changes superior fovea and macula - recommend IVA OS #1 today for PDR and DME, but pt wishes to come back on Friday, December 13th for first injection - f/u Friday December 13 -- DFE/OCT, IVA OS  3. Moderate Non-proliferative diabetic retinopathy, right eye - The incidence, risk factors for progression, natural history and treatment options for diabetic retinopathy were discussed with patient.   - The need  for close monitoring of blood glucose, blood pressure, and serum lipids, avoiding cigarette or any type of tobacco, and the need for long term follow up was also discussed with patient. - exam shows focal cluster of MA and punctate exudates - FA (12.11.24) shows ON:GEXBMWUXL MA with leakage greatest SN fovea, no NV - OCT shows +cystic changes/edema SN fovea and macula - The natural history, pathology, and characteristics of diabetic macular edema discussed with patient.  A generalized discussion of the major clinical trials concerning treatment of diabetic macular edema (ETDRS, DCT, SCORE, RISE / RIDE, and ongoing DRCR net studies) was completed.  This discussion included mention of the various approaches to treating diabetic macular edema (observation, laser photocoagulation, anti-VEGF injections with lucentis / Avastin / Eylea, steroid injections with Kenalog / Ozurdex, and intraocular surgery with vitrectomy).  The goal hemoglobin A1C of 6-7 was discussed, as well as importance of smoking cessation and hypertension control.  Need for ongoing treatment and monitoring were specifically discussed with reference to chronic nature of diabetic macular edema. - recommend IVA OD on Monday, December 16 - f/u Friday, December 13 -- DFE/OCT, possible injection OS  4,5. Hypertensive retinopathy OU - discussed importance of tight BP control - monitor  6. Mixed Cataract OU - The symptoms of cataract, surgical options, and treatments and risks were discussed with patient. - discussed diagnosis and progression - not yet visually significant - monitor for now  Ophthalmic Meds Ordered this visit:  No orders of the defined types were placed in this encounter.    No follow-ups on file.  There are no Patient Instructions on file for this visit.  This document  serves as a record of services personally performed by Karie Chimera, MD, PhD. It was created on their behalf by Berlin Hun COT, an  ophthalmic technician. The creation of this record is the provider's dictation and/or activities during the visit.    Electronically signed by: Berlin Hun COT 12.12.24 12:42 PM   Abbreviations: M myopia (nearsighted); A astigmatism; H hyperopia (farsighted); P presbyopia; Mrx spectacle prescription;  CTL contact lenses; OD right eye; OS left eye; OU both eyes  XT exotropia; ET esotropia; PEK punctate epithelial keratitis; PEE punctate epithelial erosions; DES dry eye syndrome; MGD meibomian gland dysfunction; ATs artificial tears; PFAT's preservative free artificial tears; NSC nuclear sclerotic cataract; PSC posterior subcapsular cataract; ERM epi-retinal membrane; PVD posterior vitreous detachment; RD retinal detachment; DM diabetes mellitus; DR diabetic retinopathy; NPDR non-proliferative diabetic retinopathy; PDR proliferative diabetic retinopathy; CSME clinically significant macular edema; DME diabetic macular edema; dbh dot blot hemorrhages; CWS cotton wool spot; POAG primary open angle glaucoma; C/D cup-to-disc ratio; HVF humphrey visual field; GVF goldmann visual field; OCT optical coherence tomography; IOP intraocular pressure; BRVO Branch retinal vein occlusion; CRVO central retinal vein occlusion; CRAO central retinal artery occlusion; BRAO branch retinal artery occlusion; RT retinal tear; SB scleral buckle; PPV pars plana vitrectomy; VH Vitreous hemorrhage; PRP panretinal laser photocoagulation; IVK intravitreal kenalog; VMT vitreomacular traction; MH Macular hole;  NVD neovascularization of the disc; NVE neovascularization elsewhere; AREDS age related eye disease study; ARMD age related macular degeneration; POAG primary open angle glaucoma; EBMD epithelial/anterior basement membrane dystrophy; ACIOL anterior chamber intraocular lens; IOL intraocular lens; PCIOL posterior chamber intraocular lens; Phaco/IOL phacoemulsification with intraocular lens placement; PRK photorefractive  keratectomy; LASIK laser assisted in situ keratomileusis; HTN hypertension; DM diabetes mellitus; COPD chronic obstructive pulmonary disease

## 2023-04-25 ENCOUNTER — Ambulatory Visit (INDEPENDENT_AMBULATORY_CARE_PROVIDER_SITE_OTHER): Payer: BC Managed Care – PPO | Admitting: Ophthalmology

## 2023-04-25 ENCOUNTER — Encounter (INDEPENDENT_AMBULATORY_CARE_PROVIDER_SITE_OTHER): Payer: Self-pay | Admitting: Ophthalmology

## 2023-04-25 DIAGNOSIS — H25813 Combined forms of age-related cataract, bilateral: Secondary | ICD-10-CM

## 2023-04-25 DIAGNOSIS — Z7984 Long term (current) use of oral hypoglycemic drugs: Secondary | ICD-10-CM | POA: Diagnosis not present

## 2023-04-25 DIAGNOSIS — E113311 Type 2 diabetes mellitus with moderate nonproliferative diabetic retinopathy with macular edema, right eye: Secondary | ICD-10-CM | POA: Diagnosis not present

## 2023-04-25 DIAGNOSIS — H35033 Hypertensive retinopathy, bilateral: Secondary | ICD-10-CM

## 2023-04-25 DIAGNOSIS — I10 Essential (primary) hypertension: Secondary | ICD-10-CM

## 2023-04-25 DIAGNOSIS — E113512 Type 2 diabetes mellitus with proliferative diabetic retinopathy with macular edema, left eye: Secondary | ICD-10-CM

## 2023-04-25 MED ORDER — BEVACIZUMAB CHEMO INJECTION 1.25MG/0.05ML SYRINGE FOR KALEIDOSCOPE
1.2500 mg | INTRAVITREAL | Status: AC | PRN
  Administered 2023-04-25: 1.25 mg via INTRAVITREAL

## 2023-04-28 ENCOUNTER — Ambulatory Visit (INDEPENDENT_AMBULATORY_CARE_PROVIDER_SITE_OTHER): Payer: BC Managed Care – PPO | Admitting: Ophthalmology

## 2023-04-28 ENCOUNTER — Encounter (INDEPENDENT_AMBULATORY_CARE_PROVIDER_SITE_OTHER): Payer: Self-pay | Admitting: Ophthalmology

## 2023-04-28 DIAGNOSIS — H35033 Hypertensive retinopathy, bilateral: Secondary | ICD-10-CM

## 2023-04-28 DIAGNOSIS — E113311 Type 2 diabetes mellitus with moderate nonproliferative diabetic retinopathy with macular edema, right eye: Secondary | ICD-10-CM

## 2023-04-28 DIAGNOSIS — I10 Essential (primary) hypertension: Secondary | ICD-10-CM | POA: Diagnosis not present

## 2023-04-28 DIAGNOSIS — Z7984 Long term (current) use of oral hypoglycemic drugs: Secondary | ICD-10-CM | POA: Diagnosis not present

## 2023-04-28 DIAGNOSIS — E113512 Type 2 diabetes mellitus with proliferative diabetic retinopathy with macular edema, left eye: Secondary | ICD-10-CM

## 2023-04-28 DIAGNOSIS — H25813 Combined forms of age-related cataract, bilateral: Secondary | ICD-10-CM

## 2023-04-28 MED ORDER — BEVACIZUMAB CHEMO INJECTION 1.25MG/0.05ML SYRINGE FOR KALEIDOSCOPE
1.2500 mg | INTRAVITREAL | Status: AC | PRN
  Administered 2023-04-28: 1.25 mg via INTRAVITREAL

## 2023-04-28 NOTE — Progress Notes (Signed)
[facility] - Clinic Note  04/28/2023   CHIEF COMPLAINT Patient presents for Retina Follow Up  HISTORY OF PRESENT ILLNESS: [name] is a 57 y.o. female who presents to the clinic today for:  HPI     Retina Follow Up   Patient presents with  Diabetic Retinopathy.  In both eyes.  This started 3 days ago.  Duration of 3 days.  Since onset it is stable.  I, the attending physician,  performed the HPI with the patient and updated documentation appropriately.        Comments   3 day retina PDR OS NPDR/OD IVA OD pt is reporting no vision changes noticed she denies flashes or floaters last reading 171 this am       Last edited by Rennis Chris, MD on 04/28/2023 12:43 PM.    Pt here for IVA OD #1 today   Referring physician: Alma Downs, PA-C  [facility] [address] [place],  [place] [address]  HISTORICAL INFORMATION:  Selected notes from the MEDICAL RECORD NUMBER Referred by Alma Downs, PA-C for DM exam LEE:  Ocular Hx- PMH-   CURRENT MEDICATIONS: No current outpatient medications on file. (Ophthalmic Drugs)   No current facility-administered medications for this visit. (Ophthalmic Drugs)   Current Outpatient Medications (Other)  Medication Sig   amLODipine (NORVASC) 10 MG tablet Take 1 tablet (10 mg total) by mouth daily.   ferrous sulfate (FEROSUL) 325 (65 FE) MG tablet Take 1 tablet (325 mg total) by mouth 2 (two) times daily with a meal. TAKE 1 TABLET(325 MG) BY MOUTH TWICE DAILY WITH A MEAL   glucose blood test strip 1 each by Other route daily before breakfast. Use 1 strip daily to check fasting blood glucose   metFORMIN (GLUCOPHAGE-XR) 500 MG 24 hr tablet Take 2 tablets (1,000 mg total) by mouth daily with breakfast.   olmesartan-hydrochlorothiazide (BENICAR HCT) 40-25 MG tablet Take 1 tablet by mouth daily.   rosuvastatin (CRESTOR) 10 MG tablet Take 1 tablet (10 mg total) by mouth daily.   Semaglutide,0.25  or 0.5MG /DOS, (OZEMPIC, 0.25 OR 0.5 MG/DOSE,) 2 MG/3ML SOPN Inject 0.5 mg into the skin once a week.   spironolactone (ALDACTONE) 25 MG tablet Take 1 tablet (25 mg total) by mouth once for 1 dose.   No current facility-administered medications for this visit. (Other)   REVIEW OF SYSTEMS: ROS   Positive for: Endocrine, Eyes Last edited by Etheleen Mayhew, COT on 04/28/2023  9:39 AM.      ALLERGIES No Known Allergies PAST MEDICAL HISTORY Past Medical History:  Diagnosis Date   Anemia    CHF (congestive heart failure) (HCC)    Diabetes mellitus    Heart murmur    Hypertension    Pneumonia    Past Surgical History:  Procedure Laterality Date   CESAREAN SECTION     TUBAL LIGATION     FAMILY HISTORY Family History  Problem Relation Age of Onset   Hypertension Mother    Hypertension Brother    Diabetes Neg Hx    SOCIAL HISTORY Social History   Tobacco Use   Smoking status: Never   Smokeless tobacco: Never  Vaping Use   Vaping status: Never Used  Substance Use Topics   Alcohol use: Yes    Comment: occasional   Drug use: No       OPHTHALMIC EXAM:  Base Eye Exam     Visual Acuity (Snellen -  Linear)       Right Left   Dist [place] 20/30 -2 20/20 -1   Dist ph [place] 20/25 -2          Tonometry (Tonopen, 9:44 AM)       Right Left   Pressure 16 17         Pupils       Pupils Dark Light Shape React APD   Right PERRL 3 2 Round Brisk None   Left PERRL 3 2 Round Brisk None         Visual Fields       Left Right    Full Full         Extraocular Movement       Right Left    Full, Ortho Full, Ortho         Neuro/Psych     Oriented x3: Yes   Mood/Affect: Normal           Slit Lamp and Fundus Exam     Slit Lamp Exam       Right Left   Lids/Lashes Normal Normal   Conjunctiva/Sclera mild melanosis, mild nasal pingeucula mild melanosis   Cornea Clear trace PEE, trace tear film debris   Anterior Chamber deep, clear, narrow temporal  angles deep, clear, narrow temporal angles   Iris Round and dilated, No NVI Round and dilated, No NVI   Lens 2+ Nuclear sclerosis, 2+ Cortical cataract 2+ Nuclear sclerosis, 2+ Cortical cataract   Anterior Vitreous mild syneresis mild syneresis         Fundus Exam       Right Left   Disc Pink and Sharp Pink and Sharp, +cupping   C/D Ratio 0.65 0.7   Macula Flat, Good foveal reflex, focal cluster of MA, punctate exudates, cystic changes SN fovea Flat, Good foveal reflex, scattered MA and punctate exudates ST macula   Vessels attenuated, Tortuous dilated and tortuous venules, attenuated arterioles with +copper wiring, AV crossing changes, fine peripheral NV SN and temporal periphery   Periphery Attached, rare MA Attached, focal clusters of MA/DBH temporal and superior periphery           IMAGING AND PROCEDURES  Imaging and Procedures for 04/28/2023  OCT, Retina - OU - Both Eyes       Right Eye Quality was good. Central Foveal Thickness: 316. Progression has been stable. Findings include no SRF, abnormal foveal contour, intraretinal hyper-reflective material, intraretinal fluid, vitreomacular adhesion (+cystic changes/edema SN fovea and macula-- slightly increased).   Left Eye Quality was good. Central Foveal Thickness: 293. Progression has been stable. Findings include no SRF, abnormal foveal contour, retinal drusen , intraretinal fluid, vitreomacular adhesion (Focal IRF/cystic changes superior fovea and macula).   Notes *Images captured and stored on drive  Diagnosis / Impression:  +DME OU OD: +cystic changes/edema SN fovea and macula- slightly increased OS: Focal IRF/cystic changes superior fovea and macula  Clinical management:  See below  Abbreviations: NFP - Normal foveal profile. CME - cystoid macular edema. PED - pigment epithelial detachment. IRF - intraretinal fluid. SRF - subretinal fluid. EZ - ellipsoid zone. ERM - epiretinal membrane. ORA - outer retinal  atrophy. ORT - outer retinal tubulation. SRHM - subretinal hyper-reflective material. IRHM - intraretinal hyper-reflective material      Intravitreal Injection, Pharmacologic Agent - OD - Right Eye       Time Out 04/28/2023. 10:11 AM. Confirmed correct patient, procedure, site, and patient consented.   Anesthesia Topical anesthesia was  used. Anesthetic medications included Lidocaine 4%, Proparacaine 0.5%.   Procedure Preparation included 5% betadine to ocular surface, eyelid speculum. A (32g) needle was used.   Injection: 1.25 mg Bevacizumab 1.25mg /0.82ml   Route: Intravitreal, Site: Right Eye   NDC: [id number], Lot: [id number], Expiration date: 07/21/2023   Post-op Post injection exam found visual acuity of at least counting fingers. The patient tolerated the procedure well. There were no complications. The patient received written and verbal post procedure care education. Post injection medications were not given.           ASSESSMENT/PLAN:   ICD-10-CM   1. 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 Long term (current) use of oral hypoglycemic drugs  Z79.84     3. Moderate nonproliferative diabetic retinopathy of right eye with macular edema associated with type 2 diabetes mellitus (HCC)  A54.0981 Intravitreal Injection, Pharmacologic Agent - OD - Right Eye    Bevacizumab (AVASTIN) SOLN 1.25 mg    4. Essential hypertension  I10     5. Hypertensive retinopathy of both eyes  H35.033     6. Combined forms of age-related cataract of both eyes  H25.813      1,2. Proliferative diabetic retinopathy w/o DME, left eye  - most recent A1c 8.8 on 12.9.24 -- history of poor BG control when mother became sick - s/p IVA OU #1 OD (12.16.24) and OS (12.13.24) - The incidence, risk factors for progression, natural history and treatment options for diabetic retinopathy were  discussed with patient.   - The need for close monitoring of blood glucose, blood pressure, and serum lipids, avoiding cigarette or any type of tobacco, and the need for long term follow up was also discussed with patient. - exam shows scattered MA and punctate exudates ST mac and focal clusters of MA/DBH temporal and superior periphery - FA (12.11.24) shows OS: Focal vascular perfusion defects and NV SN and temporal periphery, leaking MA greatest temporal periphery - OCT shows focal IRF/cystic changes superior fovea and macula - f/u in 4 wks for PDR OU, likely IVA OU DFE, OCT  3. Moderate Non-proliferative diabetic retinopathy, right eye - exam shows focal cluster of MA and punctate exudates - FA (12.11.24) shows [id number] MA with leakage greatest SN fovea, no NV - OCT OD shows +cystic changes/edema SN fovea and macula - slightly increased - recommend IVA OD #1 today, 12.16.24 - RBA of procedure discussed, questions answered - informed consent obtained and signed 12.13.24 (OU) - see procedure note  - f/u in 4 wks for IVA OU, see above.   4,5. Hypertensive retinopathy OU - discussed importance of tight BP control - monitor  6. Mixed Cataract OU - The symptoms of cataract, surgical options, and treatments and risks were discussed with patient. - discussed diagnosis and progression - not yet visually significant - monitor for now  Ophthalmic Meds Ordered this visit:  Meds ordered this encounter  Medications   Bevacizumab (AVASTIN) SOLN 1.25 mg     Return in about 4 weeks (around 05/26/2023) for PDR OU, DFE, OCT, possible injection IVA OU.  There are no Patient Instructions on file for this visit.  This document serves as a record of services personally performed by Karie Chimera, MD, PhD. It was created on their behalf by De Blanch, an ophthalmic technician. The creation of this record is the provider's dictation and/or activities  during the visit.    Electronically  signed by: De Blanch, OA, 04/28/23  12:44 PM  Karie Chimera, M.D., Ph.D. Diseases & Surgery of the Retina and Vitreous [facility] 04/25/2023   I have reviewed the above documentation for accuracy and completeness, and I agree with the above. Karie Chimera, M.D., Ph.D. 04/28/23 12:46 PM   Abbreviations: M myopia (nearsighted); A astigmatism; H hyperopia (farsighted); P presbyopia; Mrx spectacle prescription;  CTL contact lenses; OD right eye; OS left eye; OU both eyes  XT exotropia; ET esotropia; PEK punctate epithelial keratitis; PEE punctate epithelial erosions; DES dry eye syndrome; MGD meibomian gland dysfunction; ATs artificial tears; PFAT's preservative free artificial tears; NSC nuclear sclerotic cataract; PSC posterior subcapsular cataract; ERM epi-retinal membrane; PVD posterior vitreous detachment; RD retinal detachment; DM diabetes mellitus; DR diabetic retinopathy; NPDR non-proliferative diabetic retinopathy; PDR proliferative diabetic retinopathy; CSME clinically significant macular edema; DME diabetic macular edema; dbh dot blot hemorrhages; CWS cotton wool spot; POAG primary open angle glaucoma; C/D cup-to-disc ratio; HVF humphrey visual field; GVF goldmann visual field; OCT optical coherence tomography; IOP intraocular pressure; BRVO Branch retinal vein occlusion; CRVO central retinal vein occlusion; CRAO central retinal artery occlusion; BRAO branch retinal artery occlusion; RT retinal tear; SB scleral buckle; PPV pars plana vitrectomy; VH Vitreous hemorrhage; PRP panretinal laser photocoagulation; IVK intravitreal kenalog; VMT vitreomacular traction; MH Macular hole;  NVD neovascularization of the disc; NVE neovascularization elsewhere; AREDS age related eye disease study; ARMD age related macular degeneration; POAG primary open angle glaucoma; EBMD epithelial/anterior basement membrane dystrophy; ACIOL anterior chamber intraocular lens; IOL intraocular  lens; PCIOL posterior chamber intraocular lens; Phaco/IOL phacoemulsification with intraocular lens placement; PRK photorefractive keratectomy; LASIK laser assisted in situ keratomileusis; HTN hypertension; DM diabetes mellitus; COPD chronic obstructive pulmonary disease

## 2023-05-16 NOTE — Progress Notes (Signed)
 [facility] - Clinic Note  05/26/2023   CHIEF COMPLAINT Patient presents for Retina Follow Up  HISTORY OF PRESENT ILLNESS: [name] is a 58 y.o. female who presents to the clinic today for:  HPI     Retina Follow Up   Patient presents with  Diabetic Retinopathy.  In both eyes.  This started 4 weeks ago.  I, the attending physician,  performed the HPI with the patient and updated documentation appropriately.        Comments   Patient here for 4 weeks retina follow up for PDR OU. Patient states vision is alright. About the same. No eye pain.       Last edited by Valdemar Rogue, MD on 05/26/2023  5:25 PM.    Pt states she had no problems after her first injection, she states her eye was red for about a week, she doesn't feel like her vision has gotten any better, she is trying to get her diabetes under control   Referring physician: Clem Brands, PA-C  [facility] [address] [place],  [place] [address]  HISTORICAL INFORMATION:  Selected notes from the MEDICAL RECORD NUMBER Referred by Clem Brands, PA-C for DM exam LEE:  Ocular Hx- PMH-   CURRENT MEDICATIONS: No current outpatient medications on file. (Ophthalmic Drugs)   No current facility-administered medications for this visit. (Ophthalmic Drugs)   Current Outpatient Medications (Other)  Medication Sig   amLODipine  (NORVASC ) 10 MG tablet Take 1 tablet (10 mg total) by mouth daily.   ferrous sulfate  (FEROSUL) 325 (65 FE) MG tablet Take 1 tablet (325 mg total) by mouth 2 (two) times daily with a meal. TAKE 1 TABLET(325 MG) BY MOUTH TWICE DAILY WITH A MEAL   glucose blood test strip 1 each by Other route daily before breakfast. Use 1 strip daily to check fasting blood glucose   metFORMIN  (GLUCOPHAGE -XR) 500 MG 24 hr tablet Take 2 tablets (1,000 mg total) by mouth daily with breakfast.   olmesartan -hydrochlorothiazide  (BENICAR  HCT) 40-25 MG tablet Take 1 tablet by  mouth daily.   rosuvastatin  (CRESTOR ) 10 MG tablet Take 1 tablet (10 mg total) by mouth daily.   Semaglutide ,0.25 or 0.5MG /DOS, (OZEMPIC , 0.25 OR 0.5 MG/DOSE,) 2 MG/3ML SOPN Inject 0.5 mg into the skin once a week.   spironolactone  (ALDACTONE ) 25 MG tablet Take 1 tablet (25 mg total) by mouth once for 1 dose.   No current facility-administered medications for this visit. (Other)   REVIEW OF SYSTEMS: ROS   Positive for: Endocrine, Eyes Last edited by Orval Asberry RAMAN, COA on 05/26/2023  2:16 PM.       ALLERGIES No Known Allergies PAST MEDICAL HISTORY Past Medical History:  Diagnosis Date   Anemia    CHF (congestive heart failure) (HCC)    Diabetes mellitus    Heart murmur    Hypertension    Pneumonia    Past Surgical History:  Procedure Laterality Date   CESAREAN SECTION     TUBAL LIGATION     FAMILY HISTORY Family History  Problem Relation Age of Onset   Hypertension Mother    Hypertension Brother    Diabetes Neg Hx    SOCIAL HISTORY Social History   Tobacco Use   Smoking status: Never   Smokeless tobacco: Never  Vaping Use   Vaping status: Never Used  Substance Use Topics   Alcohol  use: Yes    Comment: occasional   Drug use: No  OPHTHALMIC EXAM:  Base Eye Exam     Visual Acuity (Snellen - Linear)       Right Left   Dist cc 20/25 -2 20/20 -2   Dist ph cc 20/20 -2     Correction: Glasses         Tonometry (Tonopen, 2:13 PM)       Right Left   Pressure 14 10         Pupils       Dark Light Shape React APD   Right 3 2 Round Brisk None   Left 3 2 Round Brisk None         Visual Fields (Counting fingers)       Left Right    Full Full         Extraocular Movement       Right Left    Full, Ortho Full, Ortho         Neuro/Psych     Oriented x3: Yes   Mood/Affect: Normal         Dilation     Both eyes: 1.0% Mydriacyl, 2.5% Phenylephrine @ 2:13 PM           Slit Lamp and Fundus Exam     Slit Lamp Exam        Right Left   Lids/Lashes Normal Normal   Conjunctiva/Sclera mild melanosis, mild nasal pingeucula mild melanosis   Cornea Clear trace PEE, trace tear film debris   Anterior Chamber deep, clear, narrow temporal angles deep, clear, narrow temporal angles   Iris Round and dilated, No NVI Round and dilated, No NVI   Lens 2+ Nuclear sclerosis, 2+ Cortical cataract 2+ Nuclear sclerosis, 2+ Cortical cataract   Anterior Vitreous mild syneresis mild syneresis         Fundus Exam       Right Left   Disc Pink and Sharp Pink and Sharp, +cupping   C/D Ratio 0.65 0.7   Macula Flat, Good foveal reflex, focal cluster of MA, punctate exudates, cystic changes SN fovea -- all improved Flat, Good foveal reflex, scattered MA and punctate exudates ST macula -- slightly improved   Vessels attenuated, Tortuous dilated and tortuous venules, attenuated arterioles with +copper wiring, AV crossing changes, fine peripheral NV SN and temporal periphery   Periphery Attached, rare MA Attached, focal clusters of MA/DBH temporal and superior periphery           Refraction     Wearing Rx       Sphere Cylinder Axis Add   Right -0.75 +1.75 161 +1.75   Left +0.25 +1.25 162 +1.75           IMAGING AND PROCEDURES  Imaging and Procedures for 05/26/2023  OCT, Retina - OU - Both Eyes       Right Eye Quality was good. Central Foveal Thickness: 282. Progression has improved. Findings include normal foveal contour, no IRF, no SRF, intraretinal hyper-reflective material, vitreomacular adhesion (Interval improvement in cystic changes/edema SN fovea and macula ).   Left Eye Quality was good. Central Foveal Thickness: 286. Progression has improved. Findings include no SRF, abnormal foveal contour, retinal drusen , intraretinal fluid, vitreomacular adhesion (Focal IRF/cystic changes superior fovea and macula -- slightly improved).   Notes *Images captured and stored on drive  Diagnosis / Impression:  +DME  OU OD: Interval improvement in cystic changes/edema SN fovea and macula  OS: Focal IRF/cystic changes superior fovea and macula -- slightly improved  Clinical management:  See below  Abbreviations: NFP - Normal foveal profile. CME - cystoid macular edema. PED - pigment epithelial detachment. IRF - intraretinal fluid. SRF - subretinal fluid. EZ - ellipsoid zone. ERM - epiretinal membrane. ORA - outer retinal atrophy. ORT - outer retinal tubulation. SRHM - subretinal hyper-reflective material. IRHM - intraretinal hyper-reflective material      Intravitreal Injection, Pharmacologic Agent - OD - Right Eye       Time Out 05/26/2023. 2:53 PM. Confirmed correct patient, procedure, site, and patient consented.   Anesthesia Topical anesthesia was used. Anesthetic medications included Lidocaine 2%, Proparacaine 0.5%.   Procedure Preparation included 5% betadine to ocular surface, eyelid speculum. A (32g) needle was used.   Injection: 1.25 mg Bevacizumab  1.25mg /0.40ml   Route: Intravitreal, Site: Right Eye   NDC: C2662926, Lot: [id number], Expiration date: 06/26/2023   Post-op Post injection exam found visual acuity of at least counting fingers. The patient tolerated the procedure well. There were no complications. The patient received written and verbal post procedure care education. Post injection medications were not given.      Intravitreal Injection, Pharmacologic Agent - OS - Left Eye       Time Out 05/26/2023. 2:53 PM. Confirmed correct patient, procedure, site, and patient consented.   Anesthesia Topical anesthesia was used. Anesthetic medications included Lidocaine 2%, Proparacaine 0.5%.   Procedure Preparation included 5% betadine to ocular surface, eyelid speculum. A (32g) needle was used.   Injection: 1.25 mg Bevacizumab  1.25mg /0.24ml   Route: Intravitreal, Site: Left Eye   NDC: C2662926, Lot: [id number], Expiration date: 06/28/2023   Post-op Post injection exam  found visual acuity of at least counting fingers. The patient tolerated the procedure well. There were no complications. The patient received written and verbal post procedure care education. Post injection medications were not given.           ASSESSMENT/PLAN:   ICD-10-CM   1. 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 Long term (current) use of oral hypoglycemic drugs  Z79.84     3. Moderate nonproliferative diabetic retinopathy of right eye with macular edema associated with type 2 diabetes mellitus (HCC)  Z88.6688 Intravitreal Injection, Pharmacologic Agent - OD - Right Eye    Bevacizumab  (AVASTIN ) SOLN 1.25 mg    4. Essential hypertension  I10     5. Hypertensive retinopathy of both eyes  H35.033     6. Combined forms of age-related cataract of both eyes  H25.813      1,2. Proliferative diabetic retinopathy w/o DME, left eye  - most recent A1c 8.8 on 12.9.24 -- history of poor BG control when mother became sick - s/p IVA OS #1 (12.13.24) - exam shows scattered MA and punctate exudates ST mac and focal clusters of MA/DBH temporal and superior periphery - FA (12.11.24) shows OS: Focal vascular perfusion defects and NV SN and temporal periphery, leaking MA greatest temporal periphery - OCT shows OD: Interval improvement in cystic changes/edema SN fovea and macula; OS: Focal IRF/cystic changes superior fovea and macula -- slightly improved - recommend IVA OS #2 today, 01.13.25 - pt wishes to proceed with injections - RBA of procedure discussed, questions answered - IVA informed consent obtained and signed, 12.13.24 (OU) - see procedure note - f/u in 4  wks -- DFE, OCT, possible injxn  3. Moderate Non-proliferative diabetic retinopathy, right eye  - s/p IVA OD #1  (12.16.24) - exam shows focal cluster of MA and punctate exudates - FA (12.11.24) shows NI:Drjuuzmzi MA with leakage greatest SN fovea, no NV - OCT OD shows +cystic changes/edema SN fovea and macula - slightly improved - recommend IVA OD #2 today, 01.13.25 - RBA of procedure discussed, questions answered - informed consent obtained and signed 12.13.24 (OU) - see procedure note  - f/u in 4 wks for IVA OU, see above.   4,5. Hypertensive retinopathy OU - discussed importance of tight BP control - monitor  6. Mixed Cataract OU - The symptoms of cataract, surgical options, and treatments and risks were discussed with patient. - discussed diagnosis and progression - not yet visually significant - monitor for now  Ophthalmic Meds Ordered this visit:  Meds ordered this encounter  Medications   Bevacizumab  (AVASTIN ) SOLN 1.25 mg   Bevacizumab  (AVASTIN ) SOLN 1.25 mg     Return in about 4 weeks (around 06/23/2023) for f/u PDR, DFE, OCT, Possible Injxn.  There are no Patient Instructions on file for this visit.  This document serves as a record of services personally performed by Redell JUDITHANN Hans, MD, PhD. It was created on their behalf by Alan PARAS. Delores, OA an ophthalmic technician. The creation of this record is the provider's dictation and/or activities during the visit.    Electronically signed by: Alan PARAS. Delores, OA 05/26/23 10:24 PM  Redell JUDITHANN Hans, M.D., Ph.D. Diseases & Surgery of the Retina and Vitreous [facility] 05/26/2023   I have reviewed the above documentation for accuracy and completeness, and I agree with the above. Redell JUDITHANN Hans, M.D., Ph.D. 05/26/23 10:28 PM   Abbreviations: M myopia (nearsighted); A astigmatism; H hyperopia (farsighted); P presbyopia; Mrx spectacle prescription;  CTL contact lenses; OD right eye; OS left eye; OU both eyes  XT exotropia; ET esotropia; PEK punctate epithelial keratitis; PEE punctate epithelial erosions; DES  dry eye syndrome; MGD meibomian gland dysfunction; ATs artificial tears; PFAT's preservative free artificial tears; NSC nuclear sclerotic cataract; PSC posterior subcapsular cataract; ERM epi-retinal membrane; PVD posterior vitreous detachment; RD retinal detachment; DM diabetes mellitus; DR diabetic retinopathy; NPDR non-proliferative diabetic retinopathy; PDR proliferative diabetic retinopathy; CSME clinically significant macular edema; DME diabetic macular edema; dbh dot blot hemorrhages; CWS cotton wool spot; POAG primary open angle glaucoma; C/D cup-to-disc ratio; HVF humphrey visual field; GVF goldmann visual field; OCT optical coherence tomography; IOP intraocular pressure; BRVO Branch retinal vein occlusion; CRVO central retinal vein occlusion; CRAO central retinal artery occlusion; BRAO branch retinal artery occlusion; RT retinal tear; SB scleral buckle; PPV pars plana vitrectomy; VH Vitreous hemorrhage; PRP panretinal laser photocoagulation; IVK intravitreal kenalog ; VMT vitreomacular traction; MH Macular hole;  NVD neovascularization of the disc; NVE neovascularization elsewhere; AREDS age related eye disease study; ARMD age related macular degeneration; POAG primary open angle glaucoma; EBMD epithelial/anterior basement membrane dystrophy; ACIOL anterior chamber intraocular lens; IOL intraocular lens; PCIOL posterior chamber intraocular lens; Phaco/IOL phacoemulsification with intraocular lens placement; PRK photorefractive keratectomy; LASIK laser assisted in situ keratomileusis; HTN hypertension; DM diabetes mellitus; COPD chronic obstructive pulmonary disease

## 2023-05-26 ENCOUNTER — Encounter (INDEPENDENT_AMBULATORY_CARE_PROVIDER_SITE_OTHER): Payer: Self-pay | Admitting: Ophthalmology

## 2023-05-26 ENCOUNTER — Ambulatory Visit (INDEPENDENT_AMBULATORY_CARE_PROVIDER_SITE_OTHER): Payer: 59 | Admitting: Ophthalmology

## 2023-05-26 DIAGNOSIS — E113311 Type 2 diabetes mellitus with moderate nonproliferative diabetic retinopathy with macular edema, right eye: Secondary | ICD-10-CM

## 2023-05-26 DIAGNOSIS — I10 Essential (primary) hypertension: Secondary | ICD-10-CM

## 2023-05-26 DIAGNOSIS — H35033 Hypertensive retinopathy, bilateral: Secondary | ICD-10-CM

## 2023-05-26 DIAGNOSIS — H25813 Combined forms of age-related cataract, bilateral: Secondary | ICD-10-CM

## 2023-05-26 DIAGNOSIS — Z7984 Long term (current) use of oral hypoglycemic drugs: Secondary | ICD-10-CM

## 2023-05-26 DIAGNOSIS — E113512 Type 2 diabetes mellitus with proliferative diabetic retinopathy with macular edema, left eye: Secondary | ICD-10-CM

## 2023-05-26 MED ORDER — BEVACIZUMAB CHEMO INJECTION 1.25MG/0.05ML SYRINGE FOR KALEIDOSCOPE
1.2500 mg | INTRAVITREAL | Status: AC | PRN
  Administered 2023-05-26: 1.25 mg via INTRAVITREAL

## 2023-06-17 NOTE — Progress Notes (Signed)
 [facility] - Clinic Note  06/23/2023   CHIEF COMPLAINT Patient presents for Retina Follow Up  HISTORY OF PRESENT ILLNESS: [name] is a 58 y.o. female who presents to the clinic today for:  HPI     Retina Follow Up   Patient presents with  Diabetic Retinopathy.  In both eyes.  This started 4 weeks ago.  Duration of 4 weeks.  Since onset it is stable.  I, the attending physician,  performed the HPI with the patient and updated documentation appropriately.        Comments   4 week retina follow up NPDR  and IVA OU pt is reporting no vision changes noticed she has some floaters denies any flashes last reading 143      Last edited by Ronelle Coffee, MD on 06/23/2023  3:40 PM.    Pt states vision is the same   Referring physician: Franky Ivanoff, PA-C  [facility] [address] [place],  [place] [address]  HISTORICAL INFORMATION:  Selected notes from the MEDICAL RECORD NUMBER Referred by Franky Ivanoff, PA-C for DM exam LEE:  Ocular Hx- PMH-   CURRENT MEDICATIONS: No current outpatient medications on file. (Ophthalmic Drugs)   No current facility-administered medications for this visit. (Ophthalmic Drugs)   Current Outpatient Medications (Other)  Medication Sig   amLODipine  (NORVASC ) 10 MG tablet Take 1 tablet (10 mg total) by mouth daily.   ferrous sulfate  (FEROSUL) 325 (65 FE) MG tablet Take 1 tablet (325 mg total) by mouth 2 (two) times daily with a meal. TAKE 1 TABLET(325 MG) BY MOUTH TWICE DAILY WITH A MEAL   glucose blood test strip 1 each by Other route daily before breakfast. Use 1 strip daily to check fasting blood glucose   metFORMIN  (GLUCOPHAGE -XR) 500 MG 24 hr tablet Take 2 tablets (1,000 mg total) by mouth daily with breakfast.   olmesartan -hydrochlorothiazide  (BENICAR  HCT) 40-25 MG tablet Take 1 tablet by mouth daily.   rosuvastatin  (CRESTOR ) 10 MG tablet Take 1 tablet (10 mg total) by mouth daily.    Semaglutide ,0.25 or 0.5MG /DOS, (OZEMPIC , 0.25 OR 0.5 MG/DOSE,) 2 MG/3ML SOPN Inject 0.5 mg into the skin once a week.   spironolactone  (ALDACTONE ) 25 MG tablet Take 1 tablet (25 mg total) by mouth once for 1 dose.   No current facility-administered medications for this visit. (Other)   REVIEW OF SYSTEMS: ROS   Positive for: Endocrine, Eyes Last edited by Alise Appl, COT on 06/23/2023  3:04 PM.     ALLERGIES No Known Allergies  PAST MEDICAL HISTORY Past Medical History:  Diagnosis Date   Anemia    CHF (congestive heart failure) (HCC)    Diabetes mellitus    Heart murmur    Hypertension    Pneumonia    Past Surgical History:  Procedure Laterality Date   CESAREAN SECTION     TUBAL LIGATION     FAMILY HISTORY Family History  Problem Relation Age of Onset   Hypertension Mother    Hypertension Brother    Diabetes Neg Hx    SOCIAL HISTORY Social History   Tobacco Use   Smoking status: Never   Smokeless tobacco: Never  Vaping Use   Vaping status: Never Used  Substance Use Topics   Alcohol  use: Yes    Comment: occasional   Drug use: No      OPHTHALMIC EXAM:  Base Eye Exam     Visual Acuity (Snellen -  Linear)       Right Left   Dist [place] 20/30 20/20   Dist ph [place] 20/25 -3          Tonometry (Tonopen, 3:09 PM)       Right Left   Pressure 12 14         Pupils       Pupils Dark Light Shape React APD   Right PERRL 3 2 Round Brisk None   Left PERRL 3 2 Round Brisk None         Visual Fields       Left Right    Full Full         Extraocular Movement       Right Left    Full, Ortho Full, Ortho         Neuro/Psych     Oriented x3: Yes   Mood/Affect: Normal         Dilation     Both eyes: 2.5% Phenylephrine @ 3:09 PM           Slit Lamp and Fundus Exam     Slit Lamp Exam       Right Left   Lids/Lashes Normal Normal   Conjunctiva/Sclera mild melanosis, mild nasal pingeucula mild melanosis   Cornea Clear trace  PEE, trace tear film debris   Anterior Chamber deep, clear, narrow temporal angles deep, clear, narrow temporal angles   Iris Round and dilated, No NVI Round and dilated, No NVI   Lens 2+ Nuclear sclerosis, 2+ Cortical cataract 2+ Nuclear sclerosis, 2+ Cortical cataract   Anterior Vitreous mild syneresis mild syneresis         Fundus Exam       Right Left   Disc Pink and Sharp Pink and Sharp, +cupping   C/D Ratio 0.65 0.7   Macula Flat, Good foveal reflex, focal cluster of MA, punctate exudates, cystic changes SN fovea -- all improved Flat, Good foveal reflex, scattered MA and punctate exudates ST macula -- slightly improved, cystic changes superior fovea -- improved   Vessels attenuated, Tortuous dilated and tortuous venules, attenuated arterioles with +copper wiring, AV crossing changes, fine peripheral NV SN and temporal periphery   Periphery Attached, rare MA Attached, focal clusters of MA/DBH temporal and superior periphery           Refraction     Wearing Rx       Sphere Cylinder Axis Add   Right -0.75 +1.75 161 +1.75   Left +0.25 +1.25 162 +1.75           IMAGING AND PROCEDURES  Imaging and Procedures for 06/23/2023  OCT, Retina - OU - Both Eyes       Right Eye Quality was good. Central Foveal Thickness: 284. Progression has improved. Findings include normal foveal contour, no IRF, no SRF, intraretinal hyper-reflective material, vitreomacular adhesion (Stable improvement in IRF, mild interval improvement in [facility] and edema nasal fovea ).   Left Eye Quality was good. Central Foveal Thickness: 284. Progression has improved. Findings include no SRF, abnormal foveal contour, retinal drusen , intraretinal fluid, vitreomacular adhesion (Focal IRF/cystic changes superior fovea and macula -- slightly improved).   Notes *Images captured and stored on drive  Diagnosis / Impression:  +DME OU OD: Stable improvement in IRF, mild interval improvement in [facility] and edema  nasal fovea   OS: Focal IRF/cystic changes superior fovea and macula -- slightly improved  Clinical management:  See below  Abbreviations: NFP -  Normal foveal profile. CME - cystoid macular edema. PED - pigment epithelial detachment. IRF - intraretinal fluid. SRF - subretinal fluid. EZ - ellipsoid zone. ERM - epiretinal membrane. ORA - outer retinal atrophy. ORT - outer retinal tubulation. SRHM - subretinal hyper-reflective material. IRHM - intraretinal hyper-reflective material      Intravitreal Injection, Pharmacologic Agent - OD - Right Eye       Time Out 06/23/2023. 3:27 PM. Confirmed correct patient, procedure, site, and patient consented.   Anesthesia Topical anesthesia was used. Anesthetic medications included Lidocaine 2%, Proparacaine 0.5%.   Procedure Preparation included 5% betadine to ocular surface, eyelid speculum. A (32g) needle was used.   Injection: 1.25 mg Bevacizumab  1.25mg /0.47ml   Route: Intravitreal, Site: Right Eye   NDC: H525437, Lot: [id number], Expiration date: 07/29/2023   Post-op Post injection exam found visual acuity of at least counting fingers. The patient tolerated the procedure well. There were no complications. The patient received written and verbal post procedure care education. Post injection medications were not given.      Intravitreal Injection, Pharmacologic Agent - OS - Left Eye       Time Out 06/23/2023. 3:28 PM. Confirmed correct patient, procedure, site, and patient consented.   Anesthesia Topical anesthesia was used. Anesthetic medications included Lidocaine 2%, Proparacaine 0.5%.   Procedure Preparation included 5% betadine to ocular surface, eyelid speculum. A (32g) needle was used.   Injection: 1.25 mg Bevacizumab  1.25mg /0.85ml   Route: Intravitreal, Site: Left Eye   NDC: H525437, Lot: [id number], Expiration date: 07/31/2023   Post-op Post injection exam found visual acuity of at least counting fingers. The patient  tolerated the procedure well. There were no complications. The patient received written and verbal post procedure care education. Post injection medications were not given.           ASSESSMENT/PLAN:   ICD-10-CM   1. 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 Long term (current) use of oral hypoglycemic drugs  Z79.84     3. Moderate nonproliferative diabetic retinopathy of right eye with macular edema associated with type 2 diabetes mellitus (HCC)  E11.3311 OCT, Retina - OU - Both Eyes    Intravitreal Injection, Pharmacologic Agent - OD - Right Eye    Bevacizumab  (AVASTIN ) SOLN 1.25 mg    4. Essential hypertension  I10     5. Hypertensive retinopathy of both eyes  H35.033     6. Combined forms of age-related cataract of both eyes  H25.813      1,2. Proliferative diabetic retinopathy w/o DME, left eye  - most recent A1c 8.8 on 12.9.24 -- history of poor BG control when mother became sick - s/p IVA OS #1 (12.13.24), #2 (01.13.25) - exam shows scattered MA and punctate exudates ST mac and focal clusters of MA/DBH temporal and superior periphery -- improving - FA (12.11.24) shows OS: Focal vascular perfusion defects and NV SN and temporal periphery, leaking MA greatest temporal periphery - OCT shows OS: Focal IRF/cystic changes superior fovea and macula -- slightly improved - recommend IVA OS #3 today, 02.10.25 - pt wishes to proceed with injections - RBA of procedure discussed, questions answered - IVA informed consent obtained and signed, 12.13.24 (OU) - see procedure note - f/u in 4 wks --  DFE, OCT, possible injxn  3. Moderate Non-proliferative diabetic retinopathy, right eye  - s/p IVA OD #1 (12.16.24), #2 (01.13.25) - exam shows focal cluster of MA and punctate  exudates - FA (12.11.24) shows [id number] MA with leakage greatest SN fovea, no NV - OCT OD shows OD: Stable improvement in IRF, mild interval improvement in [facility] and edema nasal fovea   - recommend IVA OD #3 today, 02.10.25 - RBA of procedure discussed, questions answered - informed consent obtained and signed 12.13.24 (OU) - see procedure note  - f/u in 4 wks for IVA OU, see above.   4,5. Hypertensive retinopathy OU - discussed importance of tight BP control - monitor  6. Mixed Cataract OU - The symptoms of cataract, surgical options, and treatments and risks were discussed with patient. - discussed diagnosis and progression - monitor  Ophthalmic Meds Ordered this visit:  Meds ordered this encounter  Medications   Bevacizumab  (AVASTIN ) SOLN 1.25 mg   Bevacizumab  (AVASTIN ) SOLN 1.25 mg     Return in about 4 weeks (around 07/21/2023) for PDR OU, Dilated Exam, OCT, Possible Injxn.  There are no Patient Instructions on file for this visit.  This document serves as a record of services personally performed by Jeanice Millard, MD, PhD. It was created on their behalf by Morley Arabia. Bevin Bucks, OA an ophthalmic technician. The creation of this record is the provider's dictation and/or activities during the visit.    Electronically signed by: Morley Arabia. Bevin Bucks, OA 06/23/23 3:46 PM  Jeanice Millard, M.D., Ph.D. Diseases & Surgery of the Retina and Vitreous [facility] 06/23/2023   I have reviewed the above documentation for accuracy and completeness, and I agree with the above. Jeanice Millard, M.D., Ph.D. 06/23/23 3:47 PM   Abbreviations: M myopia (nearsighted); A astigmatism; H hyperopia (farsighted); P presbyopia; Mrx spectacle prescription;  CTL contact lenses; OD right eye; OS left eye; OU both eyes  XT exotropia; ET esotropia; PEK punctate epithelial keratitis; PEE punctate epithelial erosions; DES dry eye syndrome; MGD meibomian gland dysfunction; ATs artificial  tears; PFAT's preservative free artificial tears; NSC nuclear sclerotic cataract; PSC posterior subcapsular cataract; ERM epi-retinal membrane; PVD posterior vitreous detachment; RD retinal detachment; DM diabetes mellitus; DR diabetic retinopathy; NPDR non-proliferative diabetic retinopathy; PDR proliferative diabetic retinopathy; CSME clinically significant macular edema; DME diabetic macular edema; dbh dot blot hemorrhages; CWS cotton wool spot; POAG primary open angle glaucoma; C/D cup-to-disc ratio; HVF humphrey visual field; GVF goldmann visual field; OCT optical coherence tomography; IOP intraocular pressure; BRVO Branch retinal vein occlusion; CRVO central retinal vein occlusion; CRAO central retinal artery occlusion; BRAO branch retinal artery occlusion; RT retinal tear; SB scleral buckle; PPV pars plana vitrectomy; VH Vitreous hemorrhage; PRP panretinal laser photocoagulation; IVK intravitreal kenalog ; VMT vitreomacular traction; MH Macular hole;  NVD neovascularization of the disc; NVE neovascularization elsewhere; AREDS age related eye disease study; ARMD age related macular degeneration; POAG primary open angle glaucoma; EBMD epithelial/anterior basement membrane dystrophy; ACIOL anterior chamber intraocular lens; IOL intraocular lens; PCIOL posterior chamber intraocular lens; Phaco/IOL phacoemulsification with intraocular lens placement; PRK photorefractive keratectomy; LASIK laser assisted in situ keratomileusis; HTN hypertension; DM diabetes mellitus; COPD chronic obstructive pulmonary disease

## 2023-06-23 ENCOUNTER — Ambulatory Visit (INDEPENDENT_AMBULATORY_CARE_PROVIDER_SITE_OTHER): Payer: 59 | Admitting: Ophthalmology

## 2023-06-23 ENCOUNTER — Encounter (INDEPENDENT_AMBULATORY_CARE_PROVIDER_SITE_OTHER): Payer: Self-pay | Admitting: Ophthalmology

## 2023-06-23 DIAGNOSIS — I10 Essential (primary) hypertension: Secondary | ICD-10-CM | POA: Diagnosis not present

## 2023-06-23 DIAGNOSIS — E113512 Type 2 diabetes mellitus with proliferative diabetic retinopathy with macular edema, left eye: Secondary | ICD-10-CM

## 2023-06-23 DIAGNOSIS — Z7984 Long term (current) use of oral hypoglycemic drugs: Secondary | ICD-10-CM | POA: Diagnosis not present

## 2023-06-23 DIAGNOSIS — H25813 Combined forms of age-related cataract, bilateral: Secondary | ICD-10-CM

## 2023-06-23 DIAGNOSIS — E113311 Type 2 diabetes mellitus with moderate nonproliferative diabetic retinopathy with macular edema, right eye: Secondary | ICD-10-CM | POA: Diagnosis not present

## 2023-06-23 DIAGNOSIS — H35033 Hypertensive retinopathy, bilateral: Secondary | ICD-10-CM

## 2023-06-23 MED ORDER — BEVACIZUMAB CHEMO INJECTION 1.25MG/0.05ML SYRINGE FOR KALEIDOSCOPE
1.2500 mg | INTRAVITREAL | Status: AC | PRN
  Administered 2023-06-23: 1.25 mg via INTRAVITREAL

## 2023-07-21 ENCOUNTER — Ambulatory Visit: Payer: Self-pay | Attending: Internal Medicine | Admitting: Internal Medicine

## 2023-07-21 ENCOUNTER — Encounter: Payer: Self-pay | Admitting: Internal Medicine

## 2023-07-21 VITALS — BP 155/84 | HR 89 | Temp 98.0°F | Ht 64.0 in | Wt 200.0 lb

## 2023-07-21 DIAGNOSIS — E1169 Type 2 diabetes mellitus with other specified complication: Secondary | ICD-10-CM

## 2023-07-21 DIAGNOSIS — K2900 Acute gastritis without bleeding: Secondary | ICD-10-CM

## 2023-07-21 DIAGNOSIS — Z7985 Long-term (current) use of injectable non-insulin antidiabetic drugs: Secondary | ICD-10-CM

## 2023-07-21 DIAGNOSIS — Z7984 Long term (current) use of oral hypoglycemic drugs: Secondary | ICD-10-CM

## 2023-07-21 DIAGNOSIS — E119 Type 2 diabetes mellitus without complications: Secondary | ICD-10-CM

## 2023-07-21 DIAGNOSIS — Z23 Encounter for immunization: Secondary | ICD-10-CM

## 2023-07-21 DIAGNOSIS — Z5320 Procedure and treatment not carried out because of patient's decision for unspecified reasons: Secondary | ICD-10-CM

## 2023-07-21 DIAGNOSIS — Z1211 Encounter for screening for malignant neoplasm of colon: Secondary | ICD-10-CM

## 2023-07-21 DIAGNOSIS — E669 Obesity, unspecified: Secondary | ICD-10-CM

## 2023-07-21 DIAGNOSIS — K529 Noninfective gastroenteritis and colitis, unspecified: Secondary | ICD-10-CM

## 2023-07-21 DIAGNOSIS — E785 Hyperlipidemia, unspecified: Secondary | ICD-10-CM

## 2023-07-21 DIAGNOSIS — I110 Hypertensive heart disease with heart failure: Secondary | ICD-10-CM

## 2023-07-21 LAB — POCT GLYCOSYLATED HEMOGLOBIN (HGB A1C): HbA1c, POC (controlled diabetic range): 8.3 % — AB (ref 0.0–7.0)

## 2023-07-21 LAB — GLUCOSE, POCT (MANUAL RESULT ENTRY): POC Glucose: 112 mg/dL — AB (ref 70–99)

## 2023-07-21 MED ORDER — AMLODIPINE BESYLATE 10 MG PO TABS
10.0000 mg | ORAL_TABLET | Freq: Every day | ORAL | 1 refills | Status: DC
Start: 2023-07-21 — End: 2023-12-30

## 2023-07-21 MED ORDER — ROSUVASTATIN CALCIUM 10 MG PO TABS: 10.0000 mg | ORAL_TABLET | Freq: Every day | ORAL | 1 refills | Status: DC

## 2023-07-21 MED ORDER — METFORMIN HCL ER 500 MG PO TB24: 1000.0000 mg | ORAL_TABLET | Freq: Every day | ORAL | 1 refills | Status: DC

## 2023-07-21 MED ORDER — SPIRONOLACTONE 25 MG PO TABS: 25.0000 mg | ORAL_TABLET | Freq: Once | ORAL | 1 refills | Status: DC

## 2023-07-21 MED ORDER — OLMESARTAN MEDOXOMIL-HCTZ 40-25 MG PO TABS: 1.0000 | ORAL_TABLET | Freq: Every day | ORAL | 1 refills | Status: DC

## 2023-07-21 MED ORDER — SEMAGLUTIDE (1 MG/DOSE) 4 MG/3ML ~~LOC~~ SOPN
1.0000 mg | PEN_INJECTOR | SUBCUTANEOUS | 3 refills | Status: DC
Start: 2023-07-21 — End: 2023-12-30
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
## 2023-07-28 ENCOUNTER — Encounter (INDEPENDENT_AMBULATORY_CARE_PROVIDER_SITE_OTHER): Payer: 59 | Admitting: Ophthalmology

## 2023-07-28 DIAGNOSIS — E113512 Type 2 diabetes mellitus with proliferative diabetic retinopathy with macular edema, left eye: Secondary | ICD-10-CM

## 2023-07-28 DIAGNOSIS — E113311 Type 2 diabetes mellitus with moderate nonproliferative diabetic retinopathy with macular edema, right eye: Secondary | ICD-10-CM

## 2023-07-28 DIAGNOSIS — H35033 Hypertensive retinopathy, bilateral: Secondary | ICD-10-CM

## 2023-07-28 DIAGNOSIS — H25813 Combined forms of age-related cataract, bilateral: Secondary | ICD-10-CM

## 2023-07-28 DIAGNOSIS — Z7984 Long term (current) use of oral hypoglycemic drugs: Secondary | ICD-10-CM

## 2023-07-28 DIAGNOSIS — I10 Essential (primary) hypertension: Secondary | ICD-10-CM

## 2023-08-01 NOTE — Progress Notes (Signed)
 [facility] - Clinic Note  08/06/2023   CHIEF COMPLAINT Patient presents for Retina Follow Up  HISTORY OF PRESENT ILLNESS: [name] is a 58 y.o. female who presents to the clinic today for:  HPI     Retina Follow Up   Patient presents with  Diabetic Retinopathy.  In both eyes.  This started 4 weeks ago.  Duration of 4 weeks.  Since onset it is stable.  I, the attending physician,  performed the HPI with the patient and updated documentation appropriately.        Comments   4 week retina follow up NPDR  and IVA OU. Pt states no changes in vision. Pt has not noticed any new floaters and is not having flashes. BS has not been taken for a few weeks. Last A1c 8.3, approx. 2 weeks ago. Pt is not using any artificial tears.      Last edited by Rennis Chris, MD on 08/06/2023  5:09 PM.    Pt states vision is the same   Referring physician: Alma Downs, PA-C  [facility] [address] [place],  [place] [address]  HISTORICAL INFORMATION:  Selected notes from the MEDICAL RECORD NUMBER Referred by Alma Downs, PA-C for DM exam LEE:  Ocular Hx- PMH-   CURRENT MEDICATIONS: No current outpatient medications on file. (Ophthalmic Drugs)   No current facility-administered medications for this visit. (Ophthalmic Drugs)   Current Outpatient Medications (Other)  Medication Sig   amLODipine (NORVASC) 10 MG tablet Take 1 tablet (10 mg total) by mouth daily.   ferrous sulfate (FEROSUL) 325 (65 FE) MG tablet Take 1 tablet (325 mg total) by mouth 2 (two) times daily with a meal. TAKE 1 TABLET(325 MG) BY MOUTH TWICE DAILY WITH A MEAL   glucose blood test strip 1 each by Other route daily before breakfast. Use 1 strip daily to check fasting blood glucose   metFORMIN (GLUCOPHAGE-XR) 500 MG 24 hr tablet Take 2 tablets (1,000 mg total) by mouth daily with breakfast.   olmesartan-hydrochlorothiazide (BENICAR HCT) 40-25 MG tablet Take 1 tablet by  mouth daily.   Semaglutide, 1 MG/DOSE, 4 MG/3ML SOPN Inject 1 mg as directed once a week.   spironolactone (ALDACTONE) 25 MG tablet Take 1 tablet (25 mg total) by mouth once for 1 dose.   No current facility-administered medications for this visit. (Other)   REVIEW OF SYSTEMS: ROS   Positive for: Endocrine, Eyes Last edited by Elicia Lamp, COT on 08/06/2023  3:27 PM.      ALLERGIES No Known Allergies  PAST MEDICAL HISTORY Past Medical History:  Diagnosis Date   Anemia    CHF (congestive heart failure) (HCC)    Diabetes mellitus    Heart murmur    Hypertension    Pneumonia    Past Surgical History:  Procedure Laterality Date   CESAREAN SECTION     TUBAL LIGATION     FAMILY HISTORY Family History  Problem Relation Age of Onset   Hypertension Mother    Hypertension Brother    Diabetes Neg Hx    SOCIAL HISTORY Social History   Tobacco Use   Smoking status: Never   Smokeless tobacco: Never  Vaping Use   Vaping status: Never Used  Substance Use Topics   Alcohol use: Yes    Comment: occasional   Drug use: No      OPHTHALMIC EXAM:  Base Eye Exam     Visual  Acuity (Snellen - Linear)       Right Left   Dist [place] 20/30 +2 20/25 -1   Dist ph [place] 20/20 20/20         Tonometry (Tonopen, 3:21 PM)       Right Left   Pressure 18 15         Pupils       Dark Light Shape React APD   Right 3 2 Round Brisk None   Left 3 2 Round Brisk None         Visual Fields       Left Right    Full Full         Extraocular Movement       Right Left    Full, Ortho Full, Ortho         Neuro/Psych     Oriented x3: Yes   Mood/Affect: Normal         Dilation     Both eyes: 1.0% Mydriacyl, 2.5% Phenylephrine @ 3:22 PM           Slit Lamp and Fundus Exam     Slit Lamp Exam       Right Left   Lids/Lashes Normal Normal   Conjunctiva/Sclera mild melanosis, mild nasal pingeucula mild melanosis   Cornea Clear trace PEE, trace tear film  debris   Anterior Chamber deep, clear, narrow temporal angles deep, clear, narrow temporal angles   Iris Round and dilated, No NVI Round and dilated, No NVI   Lens 2+ Nuclear sclerosis, 2+ Cortical cataract 2+ Nuclear sclerosis, 2+ Cortical cataract   Anterior Vitreous mild syneresis mild syneresis, trace asteroid hyalosis         Fundus Exam       Right Left   Disc Pink and Sharp Pink and Sharp, +cupping   C/D Ratio 0.65 0.7   Macula Flat, Good foveal reflex, focal cluster of MA, punctate exudates, cystic changes SN fovea -- all stably improved Flat, Good foveal reflex, scattered MA and punctate exudates ST macula -- improved, cystic changes superior fovea -- improved   Vessels attenuated, Tortuous dilated and tortuous venules, attenuated arterioles with +copper wiring, AV crossing changes, fine peripheral NV SN and temporal periphery   Periphery Attached, rare MA Attached, focal clusters of MA/DBH temporal and superior periphery           IMAGING AND PROCEDURES  Imaging and Procedures for 08/06/2023  OCT, Retina - OU - Both Eyes       Right Eye Quality was good. Central Foveal Thickness: 285. Progression has improved. Findings include normal foveal contour, no IRF, no SRF, intraretinal hyper-reflective material, vitreomacular adhesion (Stable improvement in IRF, interval improvement in [facility] and edema nasal fovea ).   Left Eye Quality was good. Central Foveal Thickness: 275. Progression has improved. Findings include no SRF, abnormal foveal contour, retinal drusen , intraretinal fluid, vitreomacular adhesion (Focal IRF/cystic changes superior fovea and macula -- slightly improved).   Notes *Images captured and stored on drive  Diagnosis / Impression:  +DME OU OD: Stable improvement in IRF, interval improvement in IRHM and edema nasal fovea   OS: Focal IRF/cystic changes superior fovea and macula -- slightly improved  Clinical management:  See below  Abbreviations: NFP -  Normal foveal profile. CME - cystoid macular edema. PED - pigment epithelial detachment. IRF - intraretinal fluid. SRF - subretinal fluid. EZ - ellipsoid zone. ERM - epiretinal membrane. ORA - outer retinal atrophy. ORT - outer  retinal tubulation. SRHM - subretinal hyper-reflective material. IRHM - intraretinal hyper-reflective material      Intravitreal Injection, Pharmacologic Agent - OS - Left Eye       Time Out 08/06/2023. 4:17 PM. Confirmed correct patient, procedure, site, and patient consented.   Anesthesia Topical anesthesia was used. Anesthetic medications included Lidocaine 2%, Proparacaine 0.5%.   Procedure Preparation included 5% betadine to ocular surface, eyelid speculum. A (32g) needle was used.   Injection: 1.25 mg Bevacizumab 1.25mg /0.59ml   Route: Intravitreal, Site: Left Eye   NDC: [id number], Lot: [id number], Expiration date: 11/09/2023   Post-op Post injection exam found visual acuity of at least counting fingers. The patient tolerated the procedure well. There were no complications. The patient received written and verbal post procedure care education. Post injection medications were not given.           ASSESSMENT/PLAN:   ICD-10-CM   1. 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 Long term (current) use of oral hypoglycemic drugs  Z79.84     3. Moderate nonproliferative diabetic retinopathy of right eye with macular edema associated with type 2 diabetes mellitus (HCC)  J81.1914 CANCELED: Intravitreal Injection, Pharmacologic Agent - OD - Right Eye    4. Essential hypertension  I10     5. Hypertensive retinopathy of both eyes  H35.033     6. Combined forms of age-related cataract of both eyes  H25.813      1,2. Proliferative diabetic  retinopathy w/o DME, left eye  - most recent A1c 8.8 on 12.9.24 -- history of poor BG control when mother became sick - s/p IVA OS #1 (12.13.24), #2 (01.13.25), #3 (02.10.25) - exam shows scattered MA and punctate exudates ST mac and focal clusters of MA/DBH temporal and superior periphery -- improving - FA (12.11.24) shows OS: Focal vascular perfusion defects and NV SN and temporal periphery, leaking MA greatest temporal periphery - OCT shows OS: Focal IRF/cystic changes superior fovea and macula -- slightly improved at 6 weeks - recommend IVA OS #4 today, 03.26.25 with follow up in 6 weeks again - pt wishes to proceed with injections - RBA of procedure discussed, questions answered - IVA informed consent obtained and signed, 12.13.24 (OU) - see procedure note - f/u in 6 wks -- DFE, OCT, possible injxn  3. Moderate Non-proliferative diabetic retinopathy, right eye  - s/p IVA OD #1 (12.16.24), #2 (01.13.25), #3 (02.10.25) - exam shows focal cluster of MA and punctate exudates - FA (12.11.24) shows NW:[id number] MA with leakage greatest SN fovea, no NV - OCT OD shows: Stable improvement in IRF, interval improvement in [facility] and edema nasal fovea at 6 weeks - recommend holding injection in OD today - pt in agreement - informed consent obtained and signed 12.13.24 (OU) - f/u in 6 wks, DFE, OCT.   4,5. Hypertensive retinopathy OU - discussed importance of tight BP control - monitor  6. Mixed Cataract OU - The symptoms of cataract, surgical options, and treatments and risks were discussed with patient. - discussed diagnosis and progression - monitor  Ophthalmic Meds Ordered this visit:  Meds ordered this encounter  Medications   Bevacizumab (AVASTIN) SOLN 1.25 mg     Return in about 6 weeks (around 09/17/2023) for f/u PDR OS, DFE,  OCT, Possible Injxn.  There are no Patient Instructions on file for this visit.  This document serves as a record of services personally performed by Karie Chimera, MD, PhD. It was created on their behalf by Glee Arvin. Manson Passey, OA an ophthalmic technician. The creation of this record is the provider's dictation and/or activities during the visit.    Electronically signed by: Glee Arvin. Manson Passey, OA 08/06/23 5:11 PM   Karie Chimera, M.D., Ph.D. Diseases & Surgery of the Retina and Vitreous [facility]  I have reviewed the above documentation for accuracy and completeness, and I agree with the above. Karie Chimera, M.D., Ph.D. 08/06/23 5:11 PM   Abbreviations: M myopia (nearsighted); A astigmatism; H hyperopia (farsighted); P presbyopia; Mrx spectacle prescription;  CTL contact lenses; OD right eye; OS left eye; OU both eyes  XT exotropia; ET esotropia; PEK punctate epithelial keratitis; PEE punctate epithelial erosions; DES dry eye syndrome; MGD meibomian gland dysfunction; ATs artificial tears; PFAT's preservative free artificial tears; NSC nuclear sclerotic cataract; PSC posterior subcapsular cataract; ERM epi-retinal membrane; PVD posterior vitreous detachment; RD retinal detachment; DM diabetes mellitus; DR diabetic retinopathy; NPDR non-proliferative diabetic retinopathy; PDR proliferative diabetic retinopathy; CSME clinically significant macular edema; DME diabetic macular edema; dbh dot blot hemorrhages; CWS cotton wool spot; POAG primary open angle glaucoma; C/D cup-to-disc ratio; HVF humphrey visual field; GVF goldmann visual field; OCT optical coherence tomography; IOP intraocular pressure; BRVO Branch retinal vein occlusion; CRVO central retinal vein occlusion; CRAO central retinal artery occlusion; BRAO branch retinal artery occlusion; RT retinal tear; SB scleral buckle; PPV pars plana vitrectomy; VH Vitreous hemorrhage; PRP panretinal laser photocoagulation; IVK intravitreal kenalog; VMT vitreomacular traction; MH Macular hole;  NVD neovascularization of the disc; NVE neovascularization elsewhere; AREDS age related eye  disease study; ARMD age related macular degeneration; POAG primary open angle glaucoma; EBMD epithelial/anterior basement membrane dystrophy; ACIOL anterior chamber intraocular lens; IOL intraocular lens; PCIOL posterior chamber intraocular lens; Phaco/IOL phacoemulsification with intraocular lens placement; PRK photorefractive keratectomy; LASIK laser assisted in situ keratomileusis; HTN hypertension; DM diabetes mellitus; COPD chronic obstructive pulmonary disease

## 2023-08-05 ENCOUNTER — Encounter: Payer: Self-pay | Admitting: Nurse Practitioner

## 2023-08-06 ENCOUNTER — Encounter (INDEPENDENT_AMBULATORY_CARE_PROVIDER_SITE_OTHER): Payer: Self-pay | Admitting: Ophthalmology

## 2023-08-06 ENCOUNTER — Ambulatory Visit (INDEPENDENT_AMBULATORY_CARE_PROVIDER_SITE_OTHER): Admitting: Ophthalmology

## 2023-08-06 DIAGNOSIS — I10 Essential (primary) hypertension: Secondary | ICD-10-CM

## 2023-08-06 DIAGNOSIS — H35033 Hypertensive retinopathy, bilateral: Secondary | ICD-10-CM

## 2023-08-06 DIAGNOSIS — E113512 Type 2 diabetes mellitus with proliferative diabetic retinopathy with macular edema, left eye: Secondary | ICD-10-CM

## 2023-08-06 DIAGNOSIS — E113311 Type 2 diabetes mellitus with moderate nonproliferative diabetic retinopathy with macular edema, right eye: Secondary | ICD-10-CM

## 2023-08-06 DIAGNOSIS — Z7984 Long term (current) use of oral hypoglycemic drugs: Secondary | ICD-10-CM | POA: Diagnosis not present

## 2023-08-06 DIAGNOSIS — H25813 Combined forms of age-related cataract, bilateral: Secondary | ICD-10-CM

## 2023-08-06 MED ORDER — BEVACIZUMAB CHEMO INJECTION 1.25MG/0.05ML SYRINGE FOR KALEIDOSCOPE
1.2500 mg | INTRAVITREAL | Status: AC | PRN
  Administered 2023-08-06: 1.25 mg via INTRAVITREAL

## 2023-08-11 ENCOUNTER — Telehealth: Payer: Self-pay

## 2023-08-11 ENCOUNTER — Other Ambulatory Visit: Payer: Self-pay

## 2023-08-11 NOTE — Telephone Encounter (Signed)
 Pharmacy Patient Advocate Encounter  Received notification from [facility] that Prior Authorization for [facility] has been APPROVED from 08/11/2023 to 08/11/2026   PA #/Case ID/Reference #: [id number]

## 2023-08-11 NOTE — Telephone Encounter (Signed)
 Pharmacy Patient Advocate Encounter   Received notification from CoverMyMeds that prior authorization for [facility] is required/requested.   Insurance verification completed.   The patient is insured through [facility] .   Per test claim: PA required; PA submitted to above mentioned insurance via CoverMyMeds Key/confirmation #/EOC [id number] Status is pending

## 2023-08-22 ENCOUNTER — Encounter: Payer: Self-pay | Admitting: Pharmacist

## 2023-08-22 ENCOUNTER — Ambulatory Visit: Payer: Self-pay | Attending: Internal Medicine | Admitting: Pharmacist

## 2023-08-22 VITALS — BP 114/78 | HR 65

## 2023-08-22 DIAGNOSIS — I10 Essential (primary) hypertension: Secondary | ICD-10-CM

## 2023-08-22 DIAGNOSIS — E1159 Type 2 diabetes mellitus with other circulatory complications: Secondary | ICD-10-CM

## 2023-08-22 NOTE — Progress Notes (Signed)
    S:    PCP: Dr. Laural Benes  58 y.o. female who presents for hypertension evaluation, education, and management. PMH is significant for HTN, T2DM, OA. Patient was referred and last seen by Primary Care Provider, Dr. Laural Benes, on 07/21/2023. BP was 155/84 at that visit. Medication changes were encouraged but pt elected to work on lifestyle and follow-up with me.   Today, patient arrives in spirits and presents without assistance. Denies dizziness, headache, blurred vision, swelling. She continues to be adherent with amlodipine, olmesartan-hydrochlorothiazide, and spironolactone. She has taken all medications today.    Current antihypertensives include: amlodipine 10 mg daily, olmesartan-HCTZ 40-25 mg daily, spironolactone 25 mg daily   Reported home BP readings:  -none to report since her last visit with [place]  O:  Vitals:   08/22/23 1648  BP: 114/78  Pulse: 65    Last 3 Office BP readings: BP Readings from Last 3 Encounters:  08/22/23 114/78  07/21/23 (!) 155/84  04/23/23 (!) 147/90    BMET    Component Value Date/Time   NA 140 04/21/2023 1642   K 4.7 04/21/2023 1642   CL 100 04/21/2023 1642   CO2 22 04/21/2023 1642   GLUCOSE 124 (H) 04/21/2023 1642   GLUCOSE 314 (H) 03/20/2020 1645   BUN 26 (H) 04/21/2023 1642   CREATININE 1.04 (H) 04/21/2023 1642   CREATININE 0.78 03/12/2016 1249   CALCIUM 9.6 04/21/2023 1642   GFRNONAA 67 03/31/2017 1614   GFRNONAA >89 11/03/2014 1258   GFRAA 77 03/31/2017 1614   GFRAA >89 11/03/2014 1258    Renal function: CrCl cannot be calculated (Patient's most recent lab result is older than the maximum 21 days allowed.).  Clinical ASCVD: No  The 10-year ASCVD risk score (Arnett DK, et al., 2019) is: 8.7%   Values used to calculate the score:     Age: 51 years     Sex: Female     Is Non-Hispanic African American: Yes     Diabetic: Yes     Tobacco smoker: No     Systolic Blood Pressure: 114 mmHg     Is BP treated: Yes     HDL Cholesterol:  61 mg/dL     Total Cholesterol: 209 mg/dL   A/P: Hypertension longstanding, currently at goal on current medications. Medication adherence is optimal. BP goal < 130/80 mmHg.  -Continued current regimen.  -Continued amlodipine 10 mg daily and olmesartan-HCTZ 40-25 mg daily.   -Patient educated on purpose, proper use, and potential adverse effects of spironolactone.  -F/u labs ordered - none -Counseled on lifestyle modifications for blood pressure control including reduced dietary sodium, increased exercise, adequate sleep. -Encouraged patient to check BP at home and bring log of readings to next visit. Counseled on proper use of home BP cuff.   Results reviewed and written information provided.    Written patient instructions provided. Patient verbalized understanding of treatment plan.  Total time in face to face counseling 30 minutes.    Follow-up:  Pharmacist prn.  PCP 11/20/2023.  Butch Penny, PharmD, Patsy [place], CPP Clinical Pharmacist [facility] [phone]

## 2023-09-17 ENCOUNTER — Encounter (INDEPENDENT_AMBULATORY_CARE_PROVIDER_SITE_OTHER): Admitting: Ophthalmology

## 2023-09-17 DIAGNOSIS — I10 Essential (primary) hypertension: Secondary | ICD-10-CM

## 2023-09-17 DIAGNOSIS — H35033 Hypertensive retinopathy, bilateral: Secondary | ICD-10-CM

## 2023-09-17 DIAGNOSIS — H25813 Combined forms of age-related cataract, bilateral: Secondary | ICD-10-CM

## 2023-09-17 DIAGNOSIS — E113311 Type 2 diabetes mellitus with moderate nonproliferative diabetic retinopathy with macular edema, right eye: Secondary | ICD-10-CM

## 2023-09-17 DIAGNOSIS — Z7984 Long term (current) use of oral hypoglycemic drugs: Secondary | ICD-10-CM

## 2023-09-17 DIAGNOSIS — E113512 Type 2 diabetes mellitus with proliferative diabetic retinopathy with macular edema, left eye: Secondary | ICD-10-CM

## 2023-09-18 NOTE — Progress Notes (Signed)
 [facility] - Clinic Note  09/24/2023   CHIEF COMPLAINT Patient presents for Retina Follow Up  HISTORY OF PRESENT ILLNESS: [name] is a 58 y.o. female who presents to the clinic today for:  HPI     Retina Follow Up   Patient presents with  Diabetic Retinopathy.  In both eyes.  This started 6 months ago.  Duration of 7 weeks.  Since onset it is stable.  I, the attending physician,  performed the HPI with the patient and updated documentation appropriately.        Comments   Pt presents for 7 week retina follow up, NPDR  and IVA OU. Pt states no changes in vision. Pt states she notices more floater when she is tired. Pt denies FOL/discomfort. Last A1c 8.3, 3 months ago.  Pt is not currently monitoring BS. Pt is not using any artificial tears.      Last edited by Ronelle Coffee, MD on 09/24/2023  8:49 PM.    Pt states vision is the same   Referring physician: Franky Ivanoff, PA-C  [facility] [address] [place],  [place] [address]  HISTORICAL INFORMATION:  Selected notes from the MEDICAL RECORD NUMBER Referred by Franky Ivanoff, PA-C for DM exam LEE:  Ocular Hx- PMH-   CURRENT MEDICATIONS: No current outpatient medications on file. (Ophthalmic Drugs)   No current facility-administered medications for this visit. (Ophthalmic Drugs)   Current Outpatient Medications (Other)  Medication Sig   amLODipine  (NORVASC ) 10 MG tablet Take 1 tablet (10 mg total) by mouth daily.   ferrous sulfate  (FEROSUL) 325 (65 FE) MG tablet Take 1 tablet (325 mg total) by mouth 2 (two) times daily with a meal. TAKE 1 TABLET(325 MG) BY MOUTH TWICE DAILY WITH A MEAL   metFORMIN  (GLUCOPHAGE -XR) 500 MG 24 hr tablet Take 2 tablets (1,000 mg total) by mouth daily with breakfast.   olmesartan -hydrochlorothiazide  (BENICAR  HCT) 40-25 MG tablet Take 1 tablet by mouth daily.   Semaglutide , 1 MG/DOSE, 4 MG/3ML SOPN Inject 1 mg as directed once a week.    glucose blood test strip 1 each by Other route daily before breakfast. Use 1 strip daily to check fasting blood glucose (Patient not taking: Reported on 09/24/2023)   spironolactone  (ALDACTONE ) 25 MG tablet Take 1 tablet (25 mg total) by mouth once for 1 dose.   No current facility-administered medications for this visit. (Other)   REVIEW OF SYSTEMS: ROS   Positive for: Endocrine, Eyes Last edited by Carrington Clack, COT on 09/24/2023  3:04 PM.     ALLERGIES No Known Allergies  PAST MEDICAL HISTORY Past Medical History:  Diagnosis Date   Anemia    CHF (congestive heart failure) (HCC)    Diabetes mellitus    Heart murmur    Hypertension    Pneumonia    Past Surgical History:  Procedure Laterality Date   CESAREAN SECTION     TUBAL LIGATION     FAMILY HISTORY Family History  Problem Relation Age of Onset   Hypertension Mother    Hypertension Brother    Diabetes Neg Hx    SOCIAL HISTORY Social History   Tobacco Use   Smoking status: Never   Smokeless tobacco: Never  Vaping Use   Vaping status: Never Used  Substance Use Topics   Alcohol  use: Yes    Comment: occasional   Drug use: No      OPHTHALMIC EXAM:  Base Eye  Exam     Visual Acuity (Snellen - Linear)       Right Left   Dist cc 20/20 -1 20/20 -1         Tonometry (Tonopen, 3:09 PM)       Right Left   Pressure 18 17         Pupils       Pupils Dark Light Shape React APD   Right PERRL 3 2 Round Brisk None   Left PERRL 3 2 Round Brisk None         Visual Fields       Left Right    Full Full         Extraocular Movement       Right Left    Full, Ortho Full, Ortho         Neuro/Psych     Oriented x3: Yes   Mood/Affect: Normal         Dilation     Both eyes: 1.0% Mydriacyl, 2.5% Phenylephrine @ 3:10 PM           Slit Lamp and Fundus Exam     Slit Lamp Exam       Right Left   Lids/Lashes Normal Normal   Conjunctiva/Sclera mild melanosis, mild nasal pingeucula  mild melanosis   Cornea Clear trace PEE, trace tear film debris   Anterior Chamber deep, clear, narrow temporal angles deep, clear, narrow temporal angles   Iris Round and dilated, No NVI Round and dilated, No NVI   Lens 2+ Nuclear sclerosis, 2+ Cortical cataract 2+ Nuclear sclerosis, 2+ Cortical cataract   Anterior Vitreous mild syneresis mild syneresis, trace asteroid hyalosis         Fundus Exam       Right Left   Disc Pink and Sharp Pink and Sharp, +cupping   C/D Ratio 0.65 0.7   Macula Flat, Good foveal reflex, focal cluster of MA, punctate exudates, cystic changes SN fovea -- all stably improved Flat, Good foveal reflex, scattered MA and punctate exudates ST macula -- stably improved, cystic changes superior fovea -- stably improved   Vessels attenuated, Tortuous dilated and tortuous venules, attenuated arterioles with +copper wiring, AV crossing changes, fine peripheral NV SN and temporal periphery -- ?regressing   Periphery Attached, rare MA Attached, focal clusters of MA/DBH temporal and superior periphery           Refraction     Wearing Rx       Sphere Cylinder Axis Add   Right -0.75 +1.75 161 +1.75   Left +0.25 +1.25 162 +1.75           IMAGING AND PROCEDURES  Imaging and Procedures for 09/24/2023  OCT, Retina - OU - Both Eyes        Right Eye Quality was good. Central Foveal Thickness: 281. Progression has been stable. Findings include normal foveal contour, no IRF, no SRF, intraretinal hyper-reflective material, vitreomacular adhesion (Stable improvement in IRF and IRHM / edema nasal fovea ).   Left Eye Quality was good. Central Foveal Thickness: 271. Progression has been stable. Findings include no SRF, abnormal foveal contour, retinal drusen , intraretinal fluid, vitreomacular adhesion (Focal IRF/cystic changes superior fovea and macula -- stably improved; just mild IRHM remains).   Notes  *Images captured and stored on drive  Diagnosis /  Impression:  +DME OU OD: Stable improvement in IRF, interval improvement in [facility] and edema nasal fovea   OS: Focal IRF/cystic changes superior  fovea and macula -- stably improved; just mild IRHM remains  Clinical management:  See below  Abbreviations: NFP - Normal foveal profile. CME - cystoid macular edema. PED - pigment epithelial detachment. IRF - intraretinal fluid. SRF - subretinal fluid. EZ - ellipsoid zone. ERM - epiretinal membrane. ORA - outer retinal atrophy. ORT - outer retinal tubulation. SRHM - subretinal hyper-reflective material. IRHM - intraretinal hyper-reflective material      Intravitreal Injection, Pharmacologic Agent - OS - Left Eye       Time Out 09/24/2023. 3:54 PM. Confirmed correct patient, procedure, site, and patient consented.   Anesthesia Topical anesthesia was used. Anesthetic medications included Lidocaine 2%, Proparacaine 0.5%.   Procedure Preparation included 5% betadine to ocular surface, eyelid speculum. A (32g) needle was used.   Injection: 1.25 mg Bevacizumab  1.25mg /0.61ml   Route: Intravitreal, Site: Left Eye   NDC: [id number], Lot: [id number], Expiration date: 01/15/2024   Post-op Post injection exam found visual acuity of at least counting fingers. The patient tolerated the procedure well. There were no complications. The patient received written and verbal post procedure care education. Post injection medications were not given.           ASSESSMENT/PLAN:   ICD-10-CM   1. 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 Long term (current) use of oral hypoglycemic drugs  Z79.84     3. Moderate nonproliferative diabetic retinopathy of right eye with macular edema associated with type 2 diabetes mellitus (HCC)  E11.3311     4.  Essential hypertension  I10     5. Hypertensive retinopathy of both eyes  H35.033     6. Combined forms of age-related cataract of both eyes  H25.813      1,2. Proliferative diabetic retinopathy w/o DME, left eye  - most recent A1c 8.8 on 12.9.24 -- history of poor BG control when mother became sick - s/p IVA OS #1 (12.13.24), #2 (01.13.25), #3 (02.10.25), #4 (03.26.25) - exam shows scattered MA and punctate exudates ST mac and focal clusters of MA/DBH temporal and superior periphery -- improving - FA (12.11.24) shows OS: Focal vascular perfusion defects and NV SN and temporal periphery, leaking MA greatest temporal periphery - OCT shows OS: Focal IRF/cystic changes superior fovea and macula -- stably improved; just mild IRHM remains at 7 weeks - recommend IVA OS #5 today, 05.07.25 with follow up extended to 10 weeks  - pt wishes to proceed with injections - RBA of procedure discussed, questions answered - IVA informed consent obtained and signed, 12.13.24 (OU) - see procedure note - f/u in 10 wks -- DFE, OCT, possible repeat FA, possible injxn  3. Moderate Non-proliferative diabetic retinopathy, right eye  - s/p IVA OD #1 (12.16.24), #2 (01.13.25), #3 (02.10.25) - exam shows focal cluster of MA and punctate exudates - FA (12.11.24) shows [id number] MA with leakage greatest SN fovea, no NV - OCT OD shows: Stable improvement in IRF, interval improvement in [facility] and edema nasal fovea at 7 weeks - recommend holding injection in OD today - pt in agreement - informed consent obtained and signed 12.13.24 (OU) - f/u in 10 wks, DFE, OCT.   4,5. Hypertensive retinopathy OU - discussed importance of tight BP control - monitor  6. Mixed Cataract OU -  The symptoms of cataract, surgical options, and treatments and risks were discussed with patient. - discussed diagnosis and progression - monitor  Ophthalmic Meds Ordered this visit:  Meds ordered this encounter  Medications    Bevacizumab  (AVASTIN ) SOLN 1.25 mg     Return in about 10 weeks (around 12/03/2023) for f/u PDR OU, DFE, OCT, possible repeat FA.  There are no Patient Instructions on file for this visit.  This document serves as a record of services personally performed by Jeanice Millard, MD, PhD. It was created on their behalf by Angelia Kelp, an ophthalmic technician. The creation of this record is the provider's dictation and/or activities during the visit.    Electronically signed by: Angelia Kelp, OA, 09/24/23  8:53 PM  This document serves as a record of services personally performed by Jeanice Millard, MD, PhD. It was created on their behalf by Morley Arabia. Bevin Bucks, OA an ophthalmic technician. The creation of this record is the provider's dictation and/or activities during the visit.    Electronically signed by: Morley Arabia. Bevin Bucks, OA 09/24/23 8:53 PM  Jeanice Millard, M.D., Ph.D. Diseases & Surgery of the Retina and Vitreous [facility]  I have reviewed the above documentation for accuracy and completeness, and I agree with the above. Jeanice Millard, M.D., Ph.D. 09/24/23 9:09 PM   Abbreviations: M myopia (nearsighted); A astigmatism; H hyperopia (farsighted); P presbyopia; Mrx spectacle prescription;  CTL contact lenses; OD right eye; OS left eye; OU both eyes  XT exotropia; ET esotropia; PEK punctate epithelial keratitis; PEE punctate epithelial erosions; DES dry eye syndrome; MGD meibomian gland dysfunction; ATs artificial tears; PFAT's preservative free artificial tears; NSC nuclear sclerotic cataract; PSC posterior subcapsular cataract; ERM epi-retinal membrane; PVD posterior vitreous detachment; RD retinal detachment; DM diabetes mellitus; DR diabetic retinopathy; NPDR non-proliferative diabetic retinopathy; PDR proliferative diabetic retinopathy; CSME clinically significant macular edema; DME diabetic macular edema; dbh dot blot hemorrhages; CWS cotton wool spot; POAG  primary open angle glaucoma; C/D cup-to-disc ratio; HVF humphrey visual field; GVF goldmann visual field; OCT optical coherence tomography; IOP intraocular pressure; BRVO Branch retinal vein occlusion; CRVO central retinal vein occlusion; CRAO central retinal artery occlusion; BRAO branch retinal artery occlusion; RT retinal tear; SB scleral buckle; PPV pars plana vitrectomy; VH Vitreous hemorrhage; PRP panretinal laser photocoagulation; IVK intravitreal kenalog ; VMT vitreomacular traction; MH Macular hole;  NVD neovascularization of the disc; NVE neovascularization elsewhere; AREDS age related eye disease study; ARMD age related macular degeneration; POAG primary open angle glaucoma; EBMD epithelial/anterior basement membrane dystrophy; ACIOL anterior chamber intraocular lens; IOL intraocular lens; PCIOL posterior chamber intraocular lens; Phaco/IOL phacoemulsification with intraocular lens placement; PRK photorefractive keratectomy; LASIK laser assisted in situ keratomileusis; HTN hypertension; DM diabetes mellitus; COPD chronic obstructive pulmonary disease

## 2023-09-24 ENCOUNTER — Ambulatory Visit (INDEPENDENT_AMBULATORY_CARE_PROVIDER_SITE_OTHER): Admitting: Ophthalmology

## 2023-09-24 ENCOUNTER — Encounter (INDEPENDENT_AMBULATORY_CARE_PROVIDER_SITE_OTHER): Payer: Self-pay | Admitting: Ophthalmology

## 2023-09-24 DIAGNOSIS — E113512 Type 2 diabetes mellitus with proliferative diabetic retinopathy with macular edema, left eye: Secondary | ICD-10-CM

## 2023-09-24 DIAGNOSIS — I10 Essential (primary) hypertension: Secondary | ICD-10-CM

## 2023-09-24 DIAGNOSIS — H35033 Hypertensive retinopathy, bilateral: Secondary | ICD-10-CM

## 2023-09-24 DIAGNOSIS — H25813 Combined forms of age-related cataract, bilateral: Secondary | ICD-10-CM

## 2023-09-24 DIAGNOSIS — Z7984 Long term (current) use of oral hypoglycemic drugs: Secondary | ICD-10-CM | POA: Diagnosis not present

## 2023-09-24 DIAGNOSIS — E113311 Type 2 diabetes mellitus with moderate nonproliferative diabetic retinopathy with macular edema, right eye: Secondary | ICD-10-CM | POA: Diagnosis not present

## 2023-09-24 MED ORDER — BEVACIZUMAB CHEMO INJECTION 1.25MG/0.05ML SYRINGE FOR KALEIDOSCOPE
1.2500 mg | INTRAVITREAL | Status: AC | PRN
  Administered 2023-09-24: 1.25 mg via INTRAVITREAL

## 2023-11-20 ENCOUNTER — Ambulatory Visit: Payer: Self-pay | Admitting: Internal Medicine

## 2023-12-08 NOTE — Progress Notes (Signed)
 [facility] - Clinic Note  12/22/2023   CHIEF COMPLAINT Patient presents for Retina Follow Up  HISTORY OF PRESENT ILLNESS: [name] is a 58 y.o. female who presents to the clinic today for:  HPI     Retina Follow Up   Patient presents with  Diabetic Retinopathy.  In left eye.  This started 10 weeks ago.  I, the attending physician,  performed the HPI with the patient and updated documentation appropriately.        Comments   Patient here for 10 weeks retina follow up for PDR OS. Patient states vision OS sees like a flickering-like focusing a camera. The comes into play to see. No eye pain.       Last edited by Valdemar Rogue, MD on 12/22/2023  8:39 PM.    Pt states vision is the same   Referring physician: Clem Brands, PA-C  [facility] [address] [place],  [place] [address]  HISTORICAL INFORMATION:  Selected notes from the MEDICAL RECORD NUMBER Referred by Clem Brands, PA-C for DM exam LEE:  Ocular Hx- PMH-   CURRENT MEDICATIONS: No current outpatient medications on file. (Ophthalmic Drugs)   No current facility-administered medications for this visit. (Ophthalmic Drugs)   Current Outpatient Medications (Other)  Medication Sig   amLODipine  (NORVASC ) 10 MG tablet Take 1 tablet (10 mg total) by mouth daily.   metFORMIN  (GLUCOPHAGE -XR) 500 MG 24 hr tablet Take 2 tablets (1,000 mg total) by mouth daily with breakfast.   olmesartan -hydrochlorothiazide  (BENICAR  HCT) 40-25 MG tablet Take 1 tablet by mouth daily.   Semaglutide , 1 MG/DOSE, 4 MG/3ML SOPN Inject 1 mg as directed once a week.   ferrous sulfate  (FEROSUL) 325 (65 FE) MG tablet Take 1 tablet (325 mg total) by mouth 2 (two) times daily with a meal. TAKE 1 TABLET(325 MG) BY MOUTH TWICE DAILY WITH A MEAL   glucose blood test strip 1 each by Other route daily before breakfast. Use 1 strip daily to check fasting blood glucose (Patient not taking: Reported on  12/22/2023)   spironolactone  (ALDACTONE ) 25 MG tablet Take 1 tablet (25 mg total) by mouth once for 1 dose.   No current facility-administered medications for this visit. (Other)   REVIEW OF SYSTEMS: ROS   Positive for: Endocrine, Eyes Last edited by Orval Asberry RAMAN, COA on 12/22/2023  1:39 PM.     ALLERGIES No Known Allergies  PAST MEDICAL HISTORY Past Medical History:  Diagnosis Date   Anemia    CHF (congestive heart failure) (HCC)    Diabetes mellitus    Heart murmur    Hypertension    Pneumonia    Past Surgical History:  Procedure Laterality Date   CESAREAN SECTION     TUBAL LIGATION     FAMILY HISTORY Family History  Problem Relation Age of Onset   Hypertension Mother    Hypertension Brother    Diabetes Neg Hx    SOCIAL HISTORY Social History   Tobacco Use   Smoking status: Never   Smokeless tobacco: Never  Vaping Use   Vaping status: Never Used  Substance Use Topics   Alcohol  use: Yes    Comment: occasional   Drug use: No      OPHTHALMIC EXAM:  Base Eye Exam     Visual Acuity (Snellen - Linear)       Right Left   Dist cc 20/20 20/20    Correction: Glasses  Tonometry (Tonopen, 1:36 PM)       Right Left   Pressure 20 19         Pupils       Dark Light Shape React APD   Right 3 2 Round Brisk None   Left 3 2 Round Brisk None         Visual Fields (Counting fingers)       Left Right    Full Full         Extraocular Movement       Right Left    Full, Ortho Full, Ortho         Neuro/Psych     Oriented x3: Yes   Mood/Affect: Normal         Dilation     Both eyes: 1.0% Mydriacyl, 2.5% Phenylephrine @ 1:36 PM           Slit Lamp and Fundus Exam     Slit Lamp Exam       Right Left   Lids/Lashes Normal Normal   Conjunctiva/Sclera mild melanosis, mild nasal pingeucula mild melanosis   Cornea Clear trace PEE, trace tear film debris   Anterior Chamber deep, clear, narrow temporal angles deep,  clear, narrow temporal angles   Iris Round and dilated, No NVI Round and dilated, No NVI   Lens 2+ Nuclear sclerosis, 2+ Cortical cataract 2+ Nuclear sclerosis, 2+ Cortical cataract   Anterior Vitreous mild syneresis mild syneresis, trace asteroid hyalosis         Fundus Exam       Right Left   Disc Pink and Sharp Pink and Sharp, +cupping   C/D Ratio 0.65 0.7   Macula Flat, Good foveal reflex, focal cluster of MA, punctate exudates, cystic changes SN fovea -- all stably improved Flat, Good foveal reflex, scattered MA and punctate exudates ST macula -- stably improved, cystic changes superior fovea -- stably improved, +CWS along ST arcades   Vessels attenuated, Tortuous dilated and tortuous venules, attenuated arterioles with +copper wiring, AV crossing changes, fine peripheral NV SN and temporal periphery -- ?regressing   Periphery Attached, rare MA Attached, focal clusters of MA/DBH temporal and superior periphery           Refraction     Wearing Rx       Sphere Cylinder Axis Add   Right -0.75 +1.75 161 +1.75   Left +0.25 +1.25 162 +1.75           IMAGING AND PROCEDURES  Imaging and Procedures for 12/22/2023  OCT, Retina - OU - Both Eyes       Right Eye Quality was good. Central Foveal Thickness: 287. Progression has been stable. Findings include normal foveal contour, no IRF, no SRF, intraretinal hyper-reflective material, vitreomacular adhesion (Stable improvement in IRF and IRHM / edema nasal fovea ).   Left Eye Quality was good. Central Foveal Thickness: 274. Progression has been stable. Findings include normal foveal contour, no IRF, no SRF, retinal drusen , intraretinal hyper-reflective material, vitreomacular adhesion (Stable improvement in focal IRF/cystic changes superior fovea and macula; Interal development of focal IRHM along ST venule).   Notes *Images captured and stored on drive  Diagnosis / Impression:  +DME OU OD: Stable improvement in IRF,  interval improvement in IRHM and edema nasal fovea   OS: Focal IRF/cystic changes superior fovea and macula -- stably improved; just mild IRHM remains  Clinical management:  See below  Abbreviations: NFP - Normal foveal profile. CME - cystoid  macular edema. PED - pigment epithelial detachment. IRF - intraretinal fluid. SRF - subretinal fluid. EZ - ellipsoid zone. ERM - epiretinal membrane. ORA - outer retinal atrophy. ORT - outer retinal tubulation. SRHM - subretinal hyper-reflective material. IRHM - intraretinal hyper-reflective material      Intravitreal Injection, Pharmacologic Agent - OS - Left Eye       Time Out 12/22/2023. 2:54 PM. Confirmed correct patient, procedure, site, and patient consented.   Anesthesia Topical anesthesia was used. Anesthetic medications included Lidocaine 2%, Proparacaine 0.5%.   Procedure Preparation included 5% betadine to ocular surface, eyelid speculum. A supplied (32g) needle was used.   Injection: 1.25 mg Bevacizumab  1.25mg /0.17ml   Route: Intravitreal, Site: Left Eye   NDC: [id number], Lot: [id number], Expiration date: 01/05/2024   Post-op Post injection exam found visual acuity of at least counting fingers. The patient tolerated the procedure well. There were no complications. The patient received written and verbal post procedure care education. Post injection medications were not given.      Fluorescein  Angiography Optos (Transit OS)       Right Eye Progression has been stable. Early phase findings include microaneurysm. Mid/Late phase findings include leakage, microaneurysm (Scattered MA with leakage greatest SN fovea, no NV).   Left Eye Progression has improved. Early phase findings include microaneurysm, vascular perfusion defect (Interval improvement in filling time--now normal). Mid/Late phase findings include leakage, microaneurysm, retinal neovascularization, vascular perfusion defect (Focal vascular perfusion defects and mild interval  improvement in NV SN and temporal periphery, leaking MA greatest temporal periphery).   Notes **Images stored on drive**  Impression: OD: Scattered MA with leakage greatest SN fovea, no NV -- NPDR OS: Focal vascular perfusion defects and mild interval improvement in NV SN and temporal periphery, leaking MA greatest temporal periphery -- PDR improving           ASSESSMENT/PLAN:   ICD-10-CM   1. 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 Long term (current) use of oral hypoglycemic drugs  Z79.84     3. Moderate nonproliferative diabetic retinopathy of right eye with macular edema associated with type 2 diabetes mellitus (HCC)  E11.3311     4. Essential hypertension  I10     5. Hypertensive retinopathy of both eyes  H35.033     6. Combined forms of age-related cataract of both eyes  H25.813      1,2. Proliferative diabetic retinopathy w/o DME, left eye  - most recent A1c 8.8 on 12.9.24 -- history of poor BG control when mother became sick - s/p IVA OS #1 (12.13.24), #2 (01.13.25), #3 (02.10.25), #4 (03.26.25), #5 (05.07.25) - exam shows scattered MA and punctate exudates ST mac and focal clusters of MA/DBH temporal and superior periphery -- improving - FA (12.11.24) shows OS: Focal vascular perfusion defects and NV SN and temporal periphery, leaking MA greatest temporal periphery - repeat FA (08.11.25) shows OS: Focal vascular perfusion defects and mild interval improvement in NV SN and temporal periphery, leaking MA greatest temporal periphery -- pt would benefit from PRP OS - OCT shows OS: Stable improvement in focal IRF/cystic changes superior fovea and macula; Interval development of focal IRHM along ST venule at 12.5  weeks - recommend IVA  OS #6 today, 08.11.25 with follow up in 12 weeks  - pt wishes to proceed with injection - RBA of procedure discussed, questions answered - IVA informed consent obtained and signed, 12.13.24 (OU) - see procedure note - f/u Fri 8/12 for PRP OS; then in 12 wks from today (around 11.3.25) for DFE/OCT, possible injxn  3. Moderate Non-proliferative diabetic retinopathy, right eye  - s/p IVA OD #1 (12.16.24), #2 (01.13.25), #3 (02.10.25) - exam shows focal cluster of MA and punctate exudates - FA (12.11.24) and repeat FA (08.11.25) show OD: Scattered MA with leakage greatest SN fovea, no NV - OCT OD shows: Stable improvement in IRF, interval improvement in [facility] and edema nasal fovea at 6 mos since last IVA - recommend holding injection in OD today - pt in agreement - informed consent obtained and signed 12.13.24 (OU)   4,5. Hypertensive retinopathy OU - discussed importance of tight BP control - monitor  6. Mixed Cataract OU - The symptoms of cataract, surgical options, and treatments and risks were discussed with patient. - discussed diagnosis and progression - monitor  Ophthalmic Meds Ordered this visit:  Meds ordered this encounter  Medications   Bevacizumab  (AVASTIN ) SOLN 1.25 mg     Return in 4 days (on 12/26/2023) for PRP OS (945), OCT.  There are no Patient Instructions on file for this visit.  This document serves as a record of services personally performed by Redell JUDITHANN Hans, MD, PhD. It was created on their behalf by Avelina Pereyra, COA an ophthalmic technician. The creation of this record is the provider's dictation and/or activities during the visit.   Electronically signed by: Avelina GORMAN Pereyra, COT  12/22/23  8:43 PM   Redell JUDITHANN Hans, M.D., Ph.D. Diseases & Surgery of the Retina and Vitreous [facility]  I have reviewed the above documentation for accuracy and completeness, and I agree with the above. Redell JUDITHANN Hans, M.D.,  Ph.D. 12/22/23 8:57 PM   Abbreviations: M myopia (nearsighted); A astigmatism; H hyperopia (farsighted); P presbyopia; Mrx spectacle prescription;  CTL contact lenses; OD right eye; OS left eye; OU both eyes  XT exotropia; ET esotropia; PEK punctate epithelial keratitis; PEE punctate epithelial erosions; DES dry eye syndrome; MGD meibomian gland dysfunction; ATs artificial tears; PFAT's preservative free artificial tears; NSC nuclear sclerotic cataract; PSC posterior subcapsular cataract; ERM epi-retinal membrane; PVD posterior vitreous detachment; RD retinal detachment; DM diabetes mellitus; DR diabetic retinopathy; NPDR non-proliferative diabetic retinopathy; PDR proliferative diabetic retinopathy; CSME clinically significant macular edema; DME diabetic macular edema; dbh dot blot hemorrhages; CWS cotton wool spot; POAG primary open angle glaucoma; C/D cup-to-disc ratio; HVF humphrey visual field; GVF goldmann visual field; OCT optical coherence tomography; IOP intraocular pressure; BRVO Branch retinal vein occlusion; CRVO central retinal vein occlusion; CRAO central retinal artery occlusion; BRAO branch retinal artery occlusion; RT retinal tear; SB scleral buckle; PPV pars plana vitrectomy; VH Vitreous hemorrhage; PRP panretinal laser photocoagulation; IVK intravitreal kenalog ; VMT vitreomacular traction; MH Macular hole;  NVD neovascularization of the disc; NVE neovascularization elsewhere; AREDS age related eye disease study; ARMD age related macular degeneration; POAG primary open angle glaucoma; EBMD epithelial/anterior basement membrane dystrophy; ACIOL anterior chamber intraocular lens; IOL intraocular lens; PCIOL posterior chamber intraocular lens; Phaco/IOL phacoemulsification with intraocular lens placement; PRK photorefractive keratectomy; LASIK laser assisted in situ keratomileusis; HTN hypertension; DM diabetes mellitus; COPD chronic obstructive pulmonary disease

## 2023-12-22 ENCOUNTER — Ambulatory Visit (INDEPENDENT_AMBULATORY_CARE_PROVIDER_SITE_OTHER): Admitting: Ophthalmology

## 2023-12-22 ENCOUNTER — Encounter (INDEPENDENT_AMBULATORY_CARE_PROVIDER_SITE_OTHER): Payer: Self-pay | Admitting: Ophthalmology

## 2023-12-22 VITALS — BP 166/93 | HR 83

## 2023-12-22 DIAGNOSIS — H35033 Hypertensive retinopathy, bilateral: Secondary | ICD-10-CM

## 2023-12-22 DIAGNOSIS — E113311 Type 2 diabetes mellitus with moderate nonproliferative diabetic retinopathy with macular edema, right eye: Secondary | ICD-10-CM | POA: Diagnosis not present

## 2023-12-22 DIAGNOSIS — E113512 Type 2 diabetes mellitus with proliferative diabetic retinopathy with macular edema, left eye: Secondary | ICD-10-CM

## 2023-12-22 DIAGNOSIS — Z7984 Long term (current) use of oral hypoglycemic drugs: Secondary | ICD-10-CM | POA: Diagnosis not present

## 2023-12-22 DIAGNOSIS — I10 Essential (primary) hypertension: Secondary | ICD-10-CM

## 2023-12-22 DIAGNOSIS — H25813 Combined forms of age-related cataract, bilateral: Secondary | ICD-10-CM

## 2023-12-22 MED ORDER — BEVACIZUMAB CHEMO INJECTION 1.25MG/0.05ML SYRINGE FOR KALEIDOSCOPE
1.2500 mg | INTRAVITREAL | Status: AC | PRN
Start: 2023-12-22 — End: 2023-12-22
  Administered 2023-12-22 (×2): 1.25 mg via INTRAVITREAL

## 2023-12-25 NOTE — Progress Notes (Signed)
 [facility] - Clinic Note  12/26/2023   CHIEF COMPLAINT Patient presents for Retina Follow Up  HISTORY OF PRESENT ILLNESS: [name] is a 58 y.o. female who presents to the clinic today for:  HPI     Retina Follow Up   Patient presents with  Other.  In left eye.  This started 4 days ago.  I, the attending physician,  performed the HPI with the patient and updated documentation appropriately.        Comments   Patient here for 4 days retina follow up for PRP OS. Patient state vision the same.      Last edited by Valdemar Rogue, MD on 12/26/2023 11:44 AM.    Pt states vision is the same   Referring physician: Clem Brands, PA-C  [facility] [address] [place],  [place] [address]  HISTORICAL INFORMATION:  Selected notes from the MEDICAL RECORD NUMBER Referred by Clem Brands, PA-C for DM exam LEE:  Ocular Hx- PMH-   CURRENT MEDICATIONS: Current Outpatient Medications (Ophthalmic Drugs)  Medication Sig   prednisoLONE  acetate (PRED FORTE ) 1 % ophthalmic suspension Place 1 drop into the left eye 4 (four) times daily.   No current facility-administered medications for this visit. (Ophthalmic Drugs)   Current Outpatient Medications (Other)  Medication Sig   amLODipine  (NORVASC ) 10 MG tablet Take 1 tablet (10 mg total) by mouth daily.   ferrous sulfate  (FEROSUL) 325 (65 FE) MG tablet Take 1 tablet (325 mg total) by mouth 2 (two) times daily with a meal. TAKE 1 TABLET(325 MG) BY MOUTH TWICE DAILY WITH A MEAL   metFORMIN  (GLUCOPHAGE -XR) 500 MG 24 hr tablet Take 2 tablets (1,000 mg total) by mouth daily with breakfast.   olmesartan -hydrochlorothiazide  (BENICAR  HCT) 40-25 MG tablet Take 1 tablet by mouth daily.   Semaglutide , 1 MG/DOSE, 4 MG/3ML SOPN Inject 1 mg as directed once a week.   glucose blood test strip 1 each by Other route daily before breakfast. Use 1 strip daily to check fasting blood glucose (Patient not  taking: Reported on 12/26/2023)   spironolactone  (ALDACTONE ) 25 MG tablet Take 1 tablet (25 mg total) by mouth once for 1 dose.   No current facility-administered medications for this visit. (Other)   REVIEW OF SYSTEMS: ROS   Positive for: Endocrine, Eyes Negative for: Constitutional, Gastrointestinal, Neurological, Skin, Genitourinary, Musculoskeletal, HENT, Cardiovascular, Respiratory, Psychiatric, Allergic/Imm, Heme/Lymph Last edited by Verneda Howard D, COT on 12/26/2023 10:27 AM.      ALLERGIES No Known Allergies  PAST MEDICAL HISTORY Past Medical History:  Diagnosis Date   Anemia    CHF (congestive heart failure) (HCC)    Diabetes mellitus    Heart murmur    Hypertension    Pneumonia    Past Surgical History:  Procedure Laterality Date   CESAREAN SECTION     TUBAL LIGATION     FAMILY HISTORY Family History  Problem Relation Age of Onset   Hypertension Mother    Hypertension Brother    Diabetes Neg Hx    SOCIAL HISTORY Social History   Tobacco Use   Smoking status: Never   Smokeless tobacco: Never  Vaping Use   Vaping status: Never Used  Substance Use Topics   Alcohol  use: Yes    Comment: occasional   Drug use: No      OPHTHALMIC EXAM:  Base Eye Exam     Visual Acuity (Snellen - Linear)  Right Left   Dist cc 20/20 -1 20/20    Correction: Glasses         Tonometry (Tonopen, 9:59 AM)       Right Left   Pressure def 19         Pupils       Dark Light Shape React APD   Right 3 2 Round Brisk None   Left 3 2 Round Brisk None         Visual Fields (Counting fingers)       Left Right    Full Full         Extraocular Movement       Right Left    Full, Ortho Full, Ortho         Neuro/Psych     Oriented x3: Yes   Mood/Affect: Normal         Dilation     Left eye:            Slit Lamp and Fundus Exam     Slit Lamp Exam       Right Left   Lids/Lashes Normal Normal   Conjunctiva/Sclera mild melanosis,  mild nasal pingeucula mild melanosis   Cornea Clear trace PEE, trace tear film debris   Anterior Chamber deep, clear, narrow temporal angles deep, clear, narrow temporal angles   Iris Round and dilated, No NVI Round and dilated, No NVI   Lens 2+ Nuclear sclerosis, 2+ Cortical cataract 2+ Nuclear sclerosis, 2+ Cortical cataract   Anterior Vitreous mild syneresis mild syneresis, trace asteroid hyalosis         Fundus Exam       Right Left   Disc Pink and Sharp Pink and Sharp, +cupping   C/D Ratio 0.65 0.7   Macula Flat, Good foveal reflex, focal cluster of MA, punctate exudates, cystic changes SN fovea -- all stably improved Flat, Good foveal reflex, scattered MA and punctate exudates ST macula -- stably improved, cystic changes superior fovea -- stably improved, +CWS along ST arcades   Vessels attenuated, Tortuous dilated and tortuous venules, attenuated arterioles with +copper wiring, AV crossing changes, fine peripheral NV SN and temporal periphery -- ?regressing   Periphery Attached, rare MA Attached, focal clusters of MA/DBH temporal and superior periphery           Refraction     Wearing Rx       Sphere Cylinder Axis Add   Right -0.75 +1.75 161 +1.75   Left +0.25 +1.25 162 +1.75           IMAGING AND PROCEDURES  Imaging and Procedures for 12/26/2023  OCT, Retina - OU - Both Eyes       Right Eye Quality was good. Central Foveal Thickness: 282. Progression has been stable. Findings include normal foveal contour, no IRF, no SRF, intraretinal hyper-reflective material, vitreomacular adhesion (Stable improvement in IRF, IRHM and edema nasal fovea ).   Left Eye Quality was good. Central Foveal Thickness: 272. Progression has been stable. Findings include normal foveal contour, no IRF, no SRF, retinal drusen , intraretinal hyper-reflective material, vitreomacular adhesion (Stable improvement in focal IRF/cystic changes superior fovea and macula; Persistent IRHM along ST  venule).   Notes *Images captured and stored on drive  Diagnosis / Impression:  +DME OU OD: Stable improvement in IRF, IRHM and edema nasal fovea  OS: Stable improvement in focal IRF/cystic changes superior fovea and macula; Persistent IRHM along ST venule  Clinical management:  See below  Abbreviations: NFP - Normal foveal profile. CME - cystoid macular edema. PED - pigment epithelial detachment. IRF - intraretinal fluid. SRF - subretinal fluid. EZ - ellipsoid zone. ERM - epiretinal membrane. ORA - outer retinal atrophy. ORT - outer retinal tubulation. SRHM - subretinal hyper-reflective material. IRHM - intraretinal hyper-reflective material      Panretinal Photocoagulation - OS - Left Eye       Time Out Confirmed correct patient, procedure, site, and patient consented.   Anesthesia Topical anesthesia was used. Anesthetic medications included Lidocaine 2%.   Notes LASER PROCEDURE NOTE  Diagnosis:   Proliferative Diabetic Retinopathy, LEFT EYE  Procedure:  Pan-retinal photocoagulation using slit lamp laser, LEFT EYE  Anesthesia:  Topical  Surgeon: Redell Hans, MD, PhD   Informed consent obtained, operative eye marked, and time out performed prior to initiation of laser.   Lumenis [id number] slit lamp laser Pattern: 3x3 square Power: 280 mW Duration: 30 msec  Spot size: 400 microns  # spots: 1552 spots  Complications: None.  RTC: 12 wks for DFE/OCT  Patient tolerated the procedure well and received written and verbal post-procedure care information/education.           ASSESSMENT/PLAN:   ICD-10-CM   1. 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 Long term (current) use of oral hypoglycemic drugs  Z79.84     3. Moderate nonproliferative diabetic retinopathy of right eye with macular edema  associated with type 2 diabetes mellitus (HCC)  E11.3311     4. Essential hypertension  I10     5. Hypertensive retinopathy of both eyes  H35.033     6. Combined forms of age-related cataract of both eyes  H25.813      1,2. Proliferative diabetic retinopathy w/o DME, left eye  - most recent A1c 8.3 on  8.8 on 12.9.24 -- history of poor BG control when mother became sick - s/p IVA OS #1 (12.13.24), #2 (01.13.25), #3 (02.10.25), #4 (03.26.25), #5 (05.14.25), #6 (08.11.25) - exam shows scattered MA and punctate exudates ST mac and focal clusters of MA/DBH temporal and superior periphery -- improving - FA (12.11.24) shows OS: Focal vascular perfusion defects and NV SN and temporal periphery, leaking MA greatest temporal periphery - repeat FA (08.11.25) shows OS: Focal vascular perfusion defects and mild interval improvement in NV SN and temporal periphery, leaking MA greatest temporal periphery -- pt would benefit from PRP OS - OCT shows OS: Stable improvement in focal IRF/cystic changes superior fovea and macula; Persistent IRHM along ST venule at 12.5 weeks - recommend PRP OS today (08.15.25) - pt wishes to proceed with injection - RBA of procedure discussed, questions answered - IVA informed consent obtained and signed, 12.13.24 (OU) - see procedure note - f/u around 11.3.25 for DFE/OCT, possible injection  3. Moderate Non-proliferative diabetic retinopathy, right eye  - s/p IVA OD #1 (12.16.24), #2 (01.13.25), #3 (02.10.25) - exam shows focal cluster of MA and punctate exudates - FA (12.11.24) and repeat FA (08.11.25) show OD: Scattered MA with leakage greatest SN fovea, no NV - OCT OD shows: Stable improvement in IRF, interval improvement in [facility] and edema nasal fovea at 6 mos since last IVA - recommend holding injection in OD today - pt in agreement - informed consent obtained and signed 12.13.24 (OU)  4,5. Hypertensive retinopathy OU -  discussed importance of tight BP control -  monitor  6. Mixed Cataract OU - The symptoms of cataract, surgical options, and treatments and risks were discussed with patient. - discussed diagnosis and progression - monitor  Ophthalmic Meds Ordered this visit:  Meds ordered this encounter  Medications   prednisoLONE  acetate (PRED FORTE ) 1 % ophthalmic suspension    Sig: Place 1 drop into the left eye 4 (four) times daily.    Dispense:  10 mL    Refill:  0     Return in about 3 months (around 03/15/2024) for PDR OU, DFE, OCT, Possible Injxn.  There are no Patient Instructions on file for this visit.  This document serves as a record of services personally performed by Redell JUDITHANN Hans, MD, PhD. It was created on their behalf by Avelina Pereyra, COA an ophthalmic technician. The creation of this record is the provider's dictation and/or activities during the visit.   Electronically signed by: Avelina GORMAN Pereyra, COT  12/26/23  11:49 AM   Redell JUDITHANN Hans, M.D., Ph.D. Diseases & Surgery of the Retina and Vitreous [facility]  I have reviewed the above documentation for accuracy and completeness, and I agree with the above. Redell JUDITHANN Hans, M.D., Ph.D. 12/26/23 12:29 PM   Abbreviations: M myopia (nearsighted); A astigmatism; H hyperopia (farsighted); P presbyopia; Mrx spectacle prescription;  CTL contact lenses; OD right eye; OS left eye; OU both eyes  XT exotropia; ET esotropia; PEK punctate epithelial keratitis; PEE punctate epithelial erosions; DES dry eye syndrome; MGD meibomian gland dysfunction; ATs artificial tears; PFAT's preservative free artificial tears; NSC nuclear sclerotic cataract; PSC posterior subcapsular cataract; ERM epi-retinal membrane; PVD posterior vitreous detachment; RD retinal detachment; DM diabetes mellitus; DR diabetic retinopathy; NPDR non-proliferative diabetic retinopathy; PDR proliferative diabetic retinopathy; CSME clinically significant macular edema; DME diabetic macular edema; dbh  dot blot hemorrhages; CWS cotton wool spot; POAG primary open angle glaucoma; C/D cup-to-disc ratio; HVF humphrey visual field; GVF goldmann visual field; OCT optical coherence tomography; IOP intraocular pressure; BRVO Branch retinal vein occlusion; CRVO central retinal vein occlusion; CRAO central retinal artery occlusion; BRAO branch retinal artery occlusion; RT retinal tear; SB scleral buckle; PPV pars plana vitrectomy; VH Vitreous hemorrhage; PRP panretinal laser photocoagulation; IVK intravitreal kenalog ; VMT vitreomacular traction; MH Macular hole;  NVD neovascularization of the disc; NVE neovascularization elsewhere; AREDS age related eye disease study; ARMD age related macular degeneration; POAG primary open angle glaucoma; EBMD epithelial/anterior basement membrane dystrophy; ACIOL anterior chamber intraocular lens; IOL intraocular lens; PCIOL posterior chamber intraocular lens; Phaco/IOL phacoemulsification with intraocular lens placement; PRK photorefractive keratectomy; LASIK laser assisted in situ keratomileusis; HTN hypertension; DM diabetes mellitus; COPD chronic obstructive pulmonary disease

## 2023-12-26 ENCOUNTER — Ambulatory Visit (INDEPENDENT_AMBULATORY_CARE_PROVIDER_SITE_OTHER): Admitting: Ophthalmology

## 2023-12-26 ENCOUNTER — Encounter (INDEPENDENT_AMBULATORY_CARE_PROVIDER_SITE_OTHER): Payer: Self-pay | Admitting: Ophthalmology

## 2023-12-26 DIAGNOSIS — Z7984 Long term (current) use of oral hypoglycemic drugs: Secondary | ICD-10-CM | POA: Diagnosis not present

## 2023-12-26 DIAGNOSIS — I10 Essential (primary) hypertension: Secondary | ICD-10-CM

## 2023-12-26 DIAGNOSIS — H25813 Combined forms of age-related cataract, bilateral: Secondary | ICD-10-CM

## 2023-12-26 DIAGNOSIS — E113311 Type 2 diabetes mellitus with moderate nonproliferative diabetic retinopathy with macular edema, right eye: Secondary | ICD-10-CM

## 2023-12-26 DIAGNOSIS — E113512 Type 2 diabetes mellitus with proliferative diabetic retinopathy with macular edema, left eye: Secondary | ICD-10-CM | POA: Diagnosis not present

## 2023-12-26 DIAGNOSIS — H35033 Hypertensive retinopathy, bilateral: Secondary | ICD-10-CM

## 2023-12-26 MED ORDER — PREDNISOLONE ACETATE 1 % OP SUSP: 1.0000 [drp] | Freq: Four times a day (QID) | OPHTHALMIC | 0 refills | Status: AC

## 2023-12-29 ENCOUNTER — Telehealth: Payer: Self-pay | Admitting: Internal Medicine

## 2023-12-29 NOTE — Telephone Encounter (Signed)
 Confirmed appt for 8/19

## 2023-12-30 ENCOUNTER — Ambulatory Visit: Payer: Self-pay | Attending: Internal Medicine | Admitting: Internal Medicine

## 2023-12-30 VITALS — BP 127/80 | HR 66 | Ht 64.0 in | Wt 202.0 lb

## 2023-12-30 DIAGNOSIS — N950 Postmenopausal bleeding: Secondary | ICD-10-CM

## 2023-12-30 DIAGNOSIS — Z1211 Encounter for screening for malignant neoplasm of colon: Secondary | ICD-10-CM

## 2023-12-30 DIAGNOSIS — E1159 Type 2 diabetes mellitus with other circulatory complications: Secondary | ICD-10-CM

## 2023-12-30 DIAGNOSIS — Z23 Encounter for immunization: Secondary | ICD-10-CM | POA: Diagnosis not present

## 2023-12-30 DIAGNOSIS — I152 Hypertension secondary to endocrine disorders: Secondary | ICD-10-CM

## 2023-12-30 DIAGNOSIS — Z7985 Long-term (current) use of injectable non-insulin antidiabetic drugs: Secondary | ICD-10-CM

## 2023-12-30 DIAGNOSIS — H539 Unspecified visual disturbance: Secondary | ICD-10-CM

## 2023-12-30 DIAGNOSIS — E669 Obesity, unspecified: Secondary | ICD-10-CM

## 2023-12-30 DIAGNOSIS — E11319 Type 2 diabetes mellitus with unspecified diabetic retinopathy without macular edema: Secondary | ICD-10-CM

## 2023-12-30 DIAGNOSIS — E119 Type 2 diabetes mellitus without complications: Secondary | ICD-10-CM

## 2023-12-30 DIAGNOSIS — I110 Hypertensive heart disease with heart failure: Secondary | ICD-10-CM

## 2023-12-30 DIAGNOSIS — E1169 Type 2 diabetes mellitus with other specified complication: Secondary | ICD-10-CM

## 2023-12-30 DIAGNOSIS — Z7984 Long term (current) use of oral hypoglycemic drugs: Secondary | ICD-10-CM

## 2023-12-30 LAB — POCT GLYCOSYLATED HEMOGLOBIN (HGB A1C): HbA1c, POC (controlled diabetic range): 6.9 % (ref 0.0–7.0)

## 2023-12-30 LAB — GLUCOSE, POCT (MANUAL RESULT ENTRY): POC Glucose: 121 mg/dL — AB (ref 70–99)

## 2023-12-30 MED ORDER — AMLODIPINE BESYLATE 10 MG PO TABS: 10.0000 mg | ORAL_TABLET | Freq: Every day | ORAL | 1 refills | Status: AC

## 2023-12-30 MED ORDER — SEMAGLUTIDE (1 MG/DOSE) 4 MG/3ML ~~LOC~~ SOPN: 1.0000 mg | PEN_INJECTOR | SUBCUTANEOUS | 3 refills | Status: DC

## 2023-12-30 MED ORDER — METFORMIN HCL ER 500 MG PO TB24: 1000.0000 mg | ORAL_TABLET | Freq: Every day | ORAL | 1 refills | Status: DC

## 2023-12-30 MED ORDER — OLMESARTAN MEDOXOMIL-HCTZ 40-25 MG PO TABS: 1.0000 | ORAL_TABLET | Freq: Every day | ORAL | 1 refills | Status: AC
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
## 2023-12-30 NOTE — Patient Instructions (Signed)
  VISIT SUMMARY: Today, you came in for a follow-up visit to manage your diabetes, hypertension, and hyperlipidemia. We also discussed your recent postmenopausal bleeding and proteinuria. Your diabetes management has improved, and your blood pressure is well-controlled. We addressed your recent eye symptoms and planned further evaluations for your bleeding and proteinuria.  YOUR PLAN: -TYPE 2 DIABETES MELLITUS WITH DIABETIC RETINOPATHY, LEFT EYE: Your diabetes is well-controlled with an A1c of 6.9. However, you are experiencing flashes of light in your left eye after your recent laser treatment. We will continue your current medications, Ozempic  and metformin , and have referred you to [facility] for an eye evaluation. Please call them to expedite your appointment due to the flashes of light.  -POSTMENOPAUSAL BLEEDING: Postmenopausal bleeding can be a sign of various conditions that need to be evaluated. We have referred you to [facility] for a gynecological evaluation and ordered a pelvic ultrasound to check for fibroids and other abnormalities.  -HYPERTENSION: Your blood pressure is well-controlled with your current medications. Please continue taking Benicar , amlodipine , and spironolactone  as prescribed.  -PROTEINURIA: Proteinuria means there is protein in your urine, which can be a sign of kidney issues. We will perform a urine test to check for proteinuria and order blood tests to assess your kidney function and complete blood count.  -HYPERLIPIDEMIA: Hyperlipidemia means you have high levels of fats in your blood. We did not make any changes to your current treatment plan today.  -GENERAL HEALTH MAINTENANCE: You are due for some routine vaccinations and screenings. We administered the first dose of the shingles vaccine today and have referred you for a colonoscopy.  INSTRUCTIONS: Please follow up with [facility] for your eye evaluation and [facility] for your gynecological evaluation.  Additionally, complete the urine and blood tests as ordered. Schedule your colonoscopy as soon as possible.                      Contains text generated by Abridge.                                 Contains text generated by Abridge.

## 2023-12-31 ENCOUNTER — Encounter: Payer: Self-pay | Admitting: Internal Medicine

## 2024-01-02 LAB — MICROALBUMIN / CREATININE URINE RATIO
Creatinine, Urine: 71.5 mg/dL
Microalb/Creat Ratio: 138 mg/g{creat} — ABNORMAL HIGH (ref 0–29)
Microalbumin, Urine: 98.6 ug/mL

## 2024-01-03 ENCOUNTER — Ambulatory Visit: Payer: Self-pay | Admitting: Internal Medicine

## 2024-01-05 ENCOUNTER — Telehealth: Payer: Self-pay

## 2024-01-05 NOTE — Telephone Encounter (Signed)
 Copied from CRM #[id number]. Topic: Clinical - Request for Lab/Test Order >> Jan 05, 2024 11:52 AM Logan F wrote: Reason for CRM: [place] calling from [facility] imaging wants to know if the ultrasound that was ordered is transvaginal or without transvaginal.   Please contact at [phone] ext [phone]

## 2024-01-05 NOTE — Telephone Encounter (Signed)
 With transvaginal.

## 2024-01-06 NOTE — Telephone Encounter (Signed)
 Called [facility] and spoke with Hadassah. Informed her that the order is with transvaginal. Hadassah expressed verbal understanding and will update that. No further assistance needed at this time.

## 2024-01-07 ENCOUNTER — Ambulatory Visit: Payer: Self-pay | Attending: Family Medicine

## 2024-01-07 DIAGNOSIS — N950 Postmenopausal bleeding: Secondary | ICD-10-CM

## 2024-01-07 DIAGNOSIS — E11319 Type 2 diabetes mellitus with unspecified diabetic retinopathy without macular edema: Secondary | ICD-10-CM

## 2024-01-08 LAB — BASIC METABOLIC PANEL WITH GFR
BUN/Creatinine Ratio: 31 — ABNORMAL HIGH (ref 9–23)
BUN: 27 mg/dL — ABNORMAL HIGH (ref 6–24)
CO2: 23 mmol/L (ref 20–29)
Calcium: 9.8 mg/dL (ref 8.7–10.2)
Chloride: 100 mmol/L (ref 96–106)
Creatinine, Ser: 0.88 mg/dL (ref 0.57–1.00)
Glucose: 101 mg/dL — ABNORMAL HIGH (ref 70–99)
Potassium: 4.2 mmol/L (ref 3.5–5.2)
Sodium: 139 mmol/L (ref 134–144)
eGFR: 77 mL/min/1.73 (ref >59)

## 2024-01-08 LAB — CBC
Hematocrit: 37.5 % (ref 34.0–46.6)
Hemoglobin: 11.8 g/dL (ref 11.1–15.9)
MCH: 26.1 pg — ABNORMAL LOW (ref 26.6–33.0)
MCHC: 31.5 g/dL (ref 31.5–35.7)
MCV: 83 fL (ref 79–97)
Platelets: 342 x10E3/uL (ref 150–450)
RBC: 4.52 x10E6/uL (ref 3.77–5.28)
RDW: 13.5 % (ref 11.7–15.4)
WBC: 6.4 x10E3/uL (ref 3.4–10.8)

## 2024-03-01 NOTE — Progress Notes (Shared)
 [facility] - Clinic Note  03/15/2024   CHIEF COMPLAINT Patient presents for No chief complaint on file.  HISTORY OF PRESENT ILLNESS: [name] is a 58 y.o. female who presents to the clinic today for:   Pt states vision is the same   Referring physician: Clem Brands, PA-C  [facility] [address] [place],  [place] [address]  HISTORICAL INFORMATION:  Selected notes from the MEDICAL RECORD NUMBER Referred by Clem Brands, PA-C for DM exam LEE:  Ocular Hx- PMH-   CURRENT MEDICATIONS: Current Outpatient Medications (Ophthalmic Drugs)  Medication Sig   prednisoLONE  acetate (PRED FORTE ) 1 % ophthalmic suspension Place 1 drop into the left eye 4 (four) times daily.   No current facility-administered medications for this visit. (Ophthalmic Drugs)   Current Outpatient Medications (Other)  Medication Sig   amLODipine  (NORVASC ) 10 MG tablet Take 1 tablet (10 mg total) by mouth daily.   ferrous sulfate  (FEROSUL) 325 (65 FE) MG tablet Take 1 tablet (325 mg total) by mouth 2 (two) times daily with a meal. TAKE 1 TABLET(325 MG) BY MOUTH TWICE DAILY WITH A MEAL   glucose blood test strip 1 each by Other route daily before breakfast. Use 1 strip daily to check fasting blood glucose   metFORMIN  (GLUCOPHAGE -XR) 500 MG 24 hr tablet Take 2 tablets (1,000 mg total) by mouth daily with breakfast.   olmesartan -hydrochlorothiazide  (BENICAR  HCT) 40-25 MG tablet Take 1 tablet by mouth daily.   Semaglutide , 1 MG/DOSE, 4 MG/3ML SOPN Inject 1 mg as directed once a week.   spironolactone  (ALDACTONE ) 25 MG tablet Take 1 tablet (25 mg total) by mouth once for 1 dose.   No current facility-administered medications for this visit. (Other)   REVIEW OF SYSTEMS:    ALLERGIES No Known Allergies  PAST MEDICAL HISTORY Past Medical History:  Diagnosis Date   Anemia    CHF (congestive heart failure) (HCC)    Diabetes mellitus    Heart murmur     Hypertension    Pneumonia    Past Surgical History:  Procedure Laterality Date   CESAREAN SECTION     TUBAL LIGATION     FAMILY HISTORY Family History  Problem Relation Age of Onset   Hypertension Mother    Hypertension Brother    Diabetes Neg Hx    SOCIAL HISTORY Social History   Tobacco Use   Smoking status: Never   Smokeless tobacco: Never  Vaping Use   Vaping status: Never Used  Substance Use Topics   Alcohol  use: Yes    Comment: occasional   Drug use: No      OPHTHALMIC EXAM:  Not recorded    IMAGING AND PROCEDURES  Imaging and Procedures for 03/15/2024         ASSESSMENT/PLAN:   ICD-10-CM   1. Proliferative diabetic retinopathy of left eye with macular edema associated with type 2 diabetes mellitus (HCC)  Z88.6487     2. Long term (current) use of oral hypoglycemic drugs  Z79.84     3. Moderate nonproliferative diabetic retinopathy of right eye with macular edema associated with type 2 diabetes mellitus (HCC)  E11.3311     4. Essential hypertension  I10     5. Hypertensive retinopathy of both eyes  H35.033     6. Combined forms of age-related cataract of both eyes  H25.813       1,2. Proliferative diabetic retinopathy w/o DME, left eye  -  most recent A1c 8.3 on  8.8 on 12.9.24 -- history of poor BG control when mother became sick - s/p IVA OS #1 (12.13.24), #2 (01.13.25), #3 (02.10.25), #4 (03.26.25), #5 (05.14.25), #6 (08.11.25) - exam shows scattered MA and punctate exudates ST mac and focal clusters of MA/DBH temporal and superior periphery -- improving - FA (12.11.24) shows OS: Focal vascular perfusion defects and NV SN and temporal periphery, leaking MA greatest temporal periphery - repeat FA (08.11.25) shows OS: Focal vascular perfusion defects and mild interval improvement in NV SN and temporal periphery, leaking MA greatest temporal periphery -- pt would benefit from PRP OS - OCT shows OS: Stable improvement in focal IRF/cystic changes  superior fovea and macula; Persistent IRHM along ST venule at 12.5 weeks - recommend PRP OS today (08.15.25) - pt wishes to proceed with injection - RBA of procedure discussed, questions answered - IVA informed consent obtained and signed, 12.13.24 (OU) - see procedure note - f/u around 11.3.25 for DFE/OCT, possible injection  3. Moderate Non-proliferative diabetic retinopathy, right eye  - s/p IVA OD #1 (12.16.24), #2 (01.13.25), #3 (02.10.25) - exam shows focal cluster of MA and punctate exudates - FA (12.11.24) and repeat FA (08.11.25) show OD: Scattered MA with leakage greatest SN fovea, no NV - OCT OD shows: Stable improvement in IRF, interval improvement in [facility] and edema nasal fovea at 6 mos since last IVA - recommend holding injection in OD today - pt in agreement - informed consent obtained and signed 12.13.24 (OU)  4,5. Hypertensive retinopathy OU - discussed importance of tight BP control - monitor  6. Mixed Cataract OU - The symptoms of cataract, surgical options, and treatments and risks were discussed with patient. - discussed diagnosis and progression - monitor  Ophthalmic Meds Ordered this visit:  No orders of the defined types were placed in this encounter.    No follow-ups on file.  There are no Patient Instructions on file for this visit.  This document serves as a record of services personally performed by Redell JUDITHANN Hans, MD, PhD. It was created on their behalf by Avelina Pereyra, COA an ophthalmic technician. The creation of this record is the provider's dictation and/or activities during the visit.   Electronically signed by: Avelina GORMAN Pereyra, COT  03/15/24  7:43 AM   This document serves as a record of services personally performed by Redell JUDITHANN Hans, MD, PhD. It was created on their behalf by Wanda GEANNIE Keens, COT an ophthalmic technician. The creation of this record is the provider's dictation and/or activities during the visit.    Electronically  signed by:  Wanda GEANNIE Keens, COT  03/15/24 7:43 AM  Redell JUDITHANN Hans, M.D., Ph.D. Diseases & Surgery of the Retina and Vitreous [facility]    Abbreviations: M myopia (nearsighted); A astigmatism; H hyperopia (farsighted); P presbyopia; Mrx spectacle prescription;  CTL contact lenses; OD right eye; OS left eye; OU both eyes  XT exotropia; ET esotropia; PEK punctate epithelial keratitis; PEE punctate epithelial erosions; DES dry eye syndrome; MGD meibomian gland dysfunction; ATs artificial tears; PFAT's preservative free artificial tears; NSC nuclear sclerotic cataract; PSC posterior subcapsular cataract; ERM epi-retinal membrane; PVD posterior vitreous detachment; RD retinal detachment; DM diabetes mellitus; DR diabetic retinopathy; NPDR non-proliferative diabetic retinopathy; PDR proliferative diabetic retinopathy; CSME clinically significant macular edema; DME diabetic macular edema; dbh dot blot hemorrhages; CWS cotton wool spot; POAG primary open angle glaucoma; C/D cup-to-disc ratio; HVF humphrey visual field; GVF goldmann visual  field; OCT optical coherence tomography; IOP intraocular pressure; BRVO Branch retinal vein occlusion; CRVO central retinal vein occlusion; CRAO central retinal artery occlusion; BRAO branch retinal artery occlusion; RT retinal tear; SB scleral buckle; PPV pars plana vitrectomy; VH Vitreous hemorrhage; PRP panretinal laser photocoagulation; IVK intravitreal kenalog ; VMT vitreomacular traction; MH Macular hole;  NVD neovascularization of the disc; NVE neovascularization elsewhere; AREDS age related eye disease study; ARMD age related macular degeneration; POAG primary open angle glaucoma; EBMD epithelial/anterior basement membrane dystrophy; ACIOL anterior chamber intraocular lens; IOL intraocular lens; PCIOL posterior chamber intraocular lens; Phaco/IOL phacoemulsification with intraocular lens placement; PRK photorefractive keratectomy; LASIK laser  assisted in situ keratomileusis; HTN hypertension; DM diabetes mellitus; COPD chronic obstructive pulmonary disease

## 2024-03-15 ENCOUNTER — Encounter (INDEPENDENT_AMBULATORY_CARE_PROVIDER_SITE_OTHER): Admitting: Ophthalmology

## 2024-03-15 DIAGNOSIS — E113311 Type 2 diabetes mellitus with moderate nonproliferative diabetic retinopathy with macular edema, right eye: Secondary | ICD-10-CM

## 2024-03-15 DIAGNOSIS — E113512 Type 2 diabetes mellitus with proliferative diabetic retinopathy with macular edema, left eye: Secondary | ICD-10-CM

## 2024-03-15 DIAGNOSIS — H25813 Combined forms of age-related cataract, bilateral: Secondary | ICD-10-CM

## 2024-03-15 DIAGNOSIS — H35033 Hypertensive retinopathy, bilateral: Secondary | ICD-10-CM

## 2024-03-15 DIAGNOSIS — Z7984 Long term (current) use of oral hypoglycemic drugs: Secondary | ICD-10-CM

## 2024-03-15 DIAGNOSIS — I10 Essential (primary) hypertension: Secondary | ICD-10-CM

## 2024-04-01 ENCOUNTER — Encounter: Payer: Self-pay | Admitting: Internal Medicine

## 2024-04-12 ENCOUNTER — Other Ambulatory Visit: Payer: Self-pay | Admitting: Internal Medicine

## 2024-04-12 DIAGNOSIS — I110 Hypertensive heart disease with heart failure: Secondary | ICD-10-CM

## 2024-04-30 ENCOUNTER — Ambulatory Visit: Payer: Self-pay | Admitting: Internal Medicine

## 2024-05-10 ENCOUNTER — Ambulatory Visit: Payer: Self-pay

## 2024-05-10 NOTE — Telephone Encounter (Signed)
 FYI Only or Action Required?: FYI only for provider: appointment scheduled on 05/12/24.  Patient was last seen in primary care on 12/30/2023 by Vicci Barnie NOVAK, MD.  Called Nurse Triage reporting Breast Mass.  Symptoms began about a month ago.  Interventions attempted: Nothing.  Symptoms are: gradually worsening.  Triage Disposition: See Within 3 Days in Office (overriding See Physician Within 24 Hours)  Patient/caregiver understands and will follow disposition?: Yes            Copied from CRM #[id number]. Topic: Clinical - Red Word Triage >> May 10, 2024 11:10 AM Hadassah PARAS wrote: Red Word that prompted transfer to Nurse Triage: Pt has a lump on areola. Spoke w breast center and they advised pt to req a Diagnostic mammogram refferal from PCP. Transferring to [facility] Reason for Disposition  [1] Breast looks infected (spreading redness, feels hot or painful to touch) AND [2] no fever  Answer Assessment - Initial Assessment Questions 1. SYMPTOM: What's the main symptom you're concerned about?  (e.g., lump, nipple discharge, pain, rash)     Lump at right breast nipple, describes it as hard and about the size of a marble or smaller.  2. LOCATION: Where is the lump located?     Right breast.  3. ONSET: When did symptoms  start?     About  a month ago.  4. PRIOR HISTORY: Do you have any history of prior problems with your breasts? (e.g., breast cancer, breast implant, fibrocystic breast disease)     History of fibroids and inverted nipples.  5. CAUSE: What do you think is causing this symptom?     Unsure.  6. OTHER SYMPTOMS: Do you have any other symptoms? (e.g., breast pain, fever, nipple discharge, redness or rash)     Right nipple discharge/fluid with white color; 4-5/10 pain at lump.No fever or redness.  7. PREGNANCY-BREASTFEEDING: Is there any chance you are pregnant? When was your last menstrual period? Are you breastfeeding?     N/A.  Protocols used:  Breast Symptoms-A-AH

## 2024-05-11 ENCOUNTER — Telehealth (INDEPENDENT_AMBULATORY_CARE_PROVIDER_SITE_OTHER): Payer: Self-pay | Admitting: Primary Care

## 2024-05-11 NOTE — Telephone Encounter (Signed)
 Called pt to confirm appt. Pt will be present.

## 2024-05-12 ENCOUNTER — Encounter (INDEPENDENT_AMBULATORY_CARE_PROVIDER_SITE_OTHER): Payer: Self-pay | Admitting: Primary Care

## 2024-05-12 ENCOUNTER — Other Ambulatory Visit (INDEPENDENT_AMBULATORY_CARE_PROVIDER_SITE_OTHER): Payer: Self-pay | Admitting: Primary Care

## 2024-05-12 ENCOUNTER — Ambulatory Visit (INDEPENDENT_AMBULATORY_CARE_PROVIDER_SITE_OTHER): Payer: Self-pay | Admitting: Primary Care

## 2024-05-12 VITALS — BP 140/80 | HR 94 | Resp 16 | Ht 64.0 in | Wt 200.6 lb

## 2024-05-12 DIAGNOSIS — N6312 Unspecified lump in the right breast, upper inner quadrant: Secondary | ICD-10-CM

## 2024-05-12 NOTE — Telephone Encounter (Signed)
 Noted

## 2024-05-12 NOTE — Progress Notes (Signed)
 " [facility]  [name], is a 58 y.o. female  RDW:[id number]  FMW:[id number]  DOB - Nov 27, 1965  Chief Complaint  Patient presents with   Breast Mass    Right breast  Noticed it last month   Had a mammogram 03/2023  Pt states the lump is in her nipple area  In November there were white discharge coming out the nipple          Subjective:   [name] is a 58 y.o. female here today for an acute visit.  Patient of Dr. Ferdie.  Patient has a concern with right breast noticed approximately a month ago that her nipple became inverted raised and painful to touch.  11-1 o'clock near areolar some appearance of orange peel right side of left breast lower quadrant.  Will send out for a diagnostic mammogram due to (mL diagnostic  HPI  No problems updated.  Comprehensive ROS Pertinent positive and negative noted in HPI   Allergies[1]  Past Medical History:  Diagnosis Date   Anemia    CHF (congestive heart failure) (HCC)    Diabetes mellitus    Heart murmur    Hypertension    Pneumonia     Medications Ordered Prior to Encounter[2] Health Maintenance  Topic Date Due   Hepatitis B Vaccine (1 of 3 - 19+ 3-dose series) Never done   Colon Cancer Screening  Never done   Complete foot exam   03/01/2023   Flu Shot  12/12/2023   COVID-19 Vaccine (3 - 2025-26 season) 01/12/2024   Zoster (Shingles) Vaccine (2 of 2) 02/24/2024   Eye exam for diabetics  04/22/2024   Hemoglobin A1C  07/01/2024   Yearly kidney health urinalysis for diabetes  12/30/2024   Yearly kidney function blood test for diabetes  01/06/2025   Breast Cancer Screening  04/03/2025   Pap with HPV screening  10/02/2026   DTaP/Tdap/Td vaccine (2 - Td or Tdap) 02/21/2029   Pneumococcal Vaccine for age over 70  Completed   Hepatitis C Screening  Completed   HIV Screening  Completed   HPV Vaccine  Aged Out   Meningitis B Vaccine  Aged Out    Objective:   Vitals:   05/12/24 1046  BP: (!)  175/106  Pulse: 94  Resp: 16  SpO2: 99%  Weight: 200 lb 9.6 oz (91 kg)  Height: 5' 4 (1.626 m)   BP Readings from Last 3 Encounters:  05/12/24 (!) 175/106  12/30/23 127/80  12/22/23 (!) 166/93      Physical Exam Vitals reviewed.  Constitutional:      Appearance: Normal appearance. She is obese.  HENT:     Head: Normocephalic.     Right Ear: Tympanic membrane, ear canal and external ear normal.     Left Ear: Tympanic membrane, ear canal and external ear normal.     Nose: Nose normal.     Mouth/Throat:     Mouth: Mucous membranes are moist.  Eyes:     Extraocular Movements: Extraocular movements intact.     Pupils: Pupils are equal, round, and reactive to light.  Cardiovascular:     Rate and Rhythm: Normal rate.  Pulmonary:     Effort: Pulmonary effort is normal.     Breath sounds: Normal breath sounds.  Abdominal:     General: Bowel sounds are normal.     Palpations: Abdomen is soft.  Musculoskeletal:        General: Normal range of motion.  Cervical back: Normal range of motion.  Skin:    General: Skin is warm and dry.  Neurological:     Mental Status: She is alert and oriented to person, place, and time.  Psychiatric:        Mood and Affect: Mood normal.        Behavior: Behavior normal.        Thought Content: Thought content normal.       Assessment & Plan   There are no diagnoses linked to this encounter.   Patient have been counseled extensively about nutrition and exercise. Other issues discussed during this visit include: low cholesterol diet, weight control and daily exercise, foot care, annual eye examinations at Ophthalmology, importance of adherence with medications and regular follow-up. We also discussed long term complications of uncontrolled diabetes and hypertension.   No follow-ups on file.  The patient was given clear instructions to go to ER or return to medical center if symptoms don't improve, worsen or new problems develop. The  patient verbalized understanding. The patient was told to call to get lab results if they haven't heard anything in the next week.   This note has been created with Education officer, environmental. Any transcriptional errors are unintentional.   [name] SHAUNNA Bohr, NP 05/12/2024, 11:17 AM     [1] No Known Allergies [2]  Current Outpatient Medications on File Prior to Visit  Medication Sig Dispense Refill   amLODipine  (NORVASC ) 10 MG tablet Take 1 tablet (10 mg total) by mouth daily. 90 tablet 1   ferrous sulfate  (FEROSUL) 325 (65 FE) MG tablet Take 1 tablet (325 mg total) by mouth 2 (two) times daily with a meal. TAKE 1 TABLET(325 MG) BY MOUTH TWICE DAILY WITH A MEAL 180 tablet 1   glucose blood test strip 1 each by Other route daily before breakfast. Use 1 strip daily to check fasting blood glucose 100 each 12   metFORMIN  (GLUCOPHAGE -XR) 500 MG 24 hr tablet Take 2 tablets (1,000 mg total) by mouth daily with breakfast. 180 tablet 1   olmesartan -hydrochlorothiazide  (BENICAR  HCT) 40-25 MG tablet Take 1 tablet by mouth daily. 90 tablet 1   prednisoLONE  acetate (PRED FORTE ) 1 % ophthalmic suspension Place 1 drop into the left eye 4 (four) times daily. 10 mL 0   Semaglutide , 1 MG/DOSE, 4 MG/3ML SOPN Inject 1 mg as directed once a week. 3 mL 3   spironolactone  (ALDACTONE ) 25 MG tablet TAKE 1 TABLET(25 MG) BY MOUTH EVERY DAY 90 tablet 0   No current facility-administered medications on file prior to visit.   "

## 2024-05-14 ENCOUNTER — Other Ambulatory Visit (INDEPENDENT_AMBULATORY_CARE_PROVIDER_SITE_OTHER): Payer: Self-pay | Admitting: Primary Care

## 2024-05-14 ENCOUNTER — Ambulatory Visit
Admission: RE | Admit: 2024-05-14 | Discharge: 2024-05-14 | Disposition: A | Source: Ambulatory Visit | Attending: Primary Care | Admitting: Primary Care

## 2024-05-14 DIAGNOSIS — N6312 Unspecified lump in the right breast, upper inner quadrant: Secondary | ICD-10-CM

## 2024-05-14 DIAGNOSIS — R590 Localized enlarged lymph nodes: Secondary | ICD-10-CM

## 2024-05-14 DIAGNOSIS — N6310 Unspecified lump in the right breast, unspecified quadrant: Secondary | ICD-10-CM

## 2024-05-18 ENCOUNTER — Inpatient Hospital Stay: Admission: RE | Admit: 2024-05-18 | Discharge: 2024-05-18 | Attending: Primary Care | Admitting: Primary Care

## 2024-05-18 ENCOUNTER — Ambulatory Visit
Admission: RE | Admit: 2024-05-18 | Discharge: 2024-05-18 | Disposition: A | Discharge status: Home / Self Care | Source: Ambulatory Visit | Attending: Primary Care | Admitting: Primary Care

## 2024-05-18 DIAGNOSIS — N6310 Unspecified lump in the right breast, unspecified quadrant: Secondary | ICD-10-CM

## 2024-05-18 DIAGNOSIS — R590 Localized enlarged lymph nodes: Secondary | ICD-10-CM

## 2024-05-18 HISTORY — PX: BREAST BIOPSY: SHX20

## 2024-05-19 LAB — SURGICAL PATHOLOGY

## 2024-05-24 ENCOUNTER — Encounter: Payer: Self-pay | Admitting: *Deleted

## 2024-05-24 DIAGNOSIS — C50411 Malignant neoplasm of upper-outer quadrant of right female breast: Secondary | ICD-10-CM | POA: Insufficient documentation

## 2024-05-25 NOTE — Progress Notes (Signed)
 " Radiation Oncology         [phone]) [phone] ________________________________  Initial Outpatient Consultation  Name: [name] MRN: [id number]  Date: 05/26/2024  DOB: 1966-03-02  RR:Gnywdnw, Barnie NOVAK, MD  Vanderbilt Ned, MD   REFERRING PHYSICIAN: Vanderbilt Ned, MD  DIAGNOSIS: No diagnosis found.   Cancer Staging  No matching staging information was found for the patient.   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 female who presented to to her PCP on 05/12/24 with c/o swelling and a palpable mass to the right nipple-areolar complex area. Per encounter notes, her symptoms began in November (2025) and she also noticed right nipple inversion as well as a peau d'orange appearance to the lower inner quadrant of her left breast.  In this setting, she underwent a bilateral diagnostic mammogram and right breast ultrasound on 05/15/23 which collectively demonstrated:   -- A suspicious 7 mm mass in the 12 o'clock right breast located 5 cmfn (likely corresponding to an upper right breast mass depicted mammographically)   -- A suspicious 9 mm mass in the 9:30 o'clock right breast located 8 cmfn (likely corresponding to an upper outer right breast depicted mammographically)   -- Abnormal thickening and pitting of the skin of the right areola and adjacent breast, suspicious for malignancy dermal invasion.   -- Four additional indeterminate right breast masses without mammographic correlates; the largest of which measuring 7 mm located in the 12:30 o'clock right breast.   -- A single enlarged right axillary lymph node with a cortical thickness of 1.2 cm.   Biopsies were accordingly collected on 05/18/24 (results detailed as follows):   -- Biopsy of the 12 o'clock right breast mass  showed: grade 2 invasive ductal carcinoma measuring 8 mm in the greatest linear extent of the sample, with intermediate grade papillary DCIS w/ necrosis; positive for LVI.   ER status: 5% positive with weak staining intensity; PR status 0% negative; Proliferation marker Ki67 at 15%; Her2 status positive; Grade 2.  -- Biopsy of the 9:30 o'clock right breast mass showed: focal intermediate grade DCIS measuring 1 mm in the greatest linear extent of the sample with necrosis present.   -- Biopsy of the single abnormal right axillary lymph node showed metastatic carcinoma.  Of note: Upon record review, breast imaging performed in 2018 showed multiple benign appearing cysts in both breasts. She did not undergo biopsies of these.    ***  PREVIOUS RADIATION THERAPY: No  PAST MEDICAL HISTORY:  has a past medical history of Anemia, CHF (congestive heart failure) (HCC), Diabetes mellitus, Heart murmur, Hypertension, and Pneumonia.    PAST SURGICAL HISTORY: Past Surgical History:  Procedure Laterality Date   BREAST BIOPSY Right 05/18/2024   US  RT BREAST BX W LOC DEV 1ST LESION IMG BX SPEC US  GUIDE 05/18/2024 [facility]   BREAST BIOPSY Right 05/18/2024   US  RT BREAST BX W LOC DEV EA ADD LESION IMG BX SPEC US  GUIDE 05/18/2024 [facility]   CESAREAN SECTION     TUBAL LIGATION      FAMILY HISTORY: family history includes Hypertension in her brother and mother.  SOCIAL HISTORY:  reports that she has never smoked. She has never used smokeless tobacco. She reports current alcohol  use. She reports that she does not  use drugs.  ALLERGIES: Patient has no known allergies.  MEDICATIONS:  Current Outpatient Medications  Medication Sig Dispense Refill   amLODipine  (NORVASC ) 10 MG tablet Take 1 tablet (10 mg total) by mouth daily. 90 tablet 1   ferrous sulfate  (FEROSUL) 325 (65 FE) MG tablet Take 1 tablet (325 mg total) by mouth 2 (two) times daily with a meal. TAKE 1 TABLET(325 MG) BY MOUTH TWICE DAILY  WITH A MEAL 180 tablet 1   glucose blood test strip 1 each by Other route daily before breakfast. Use 1 strip daily to check fasting blood glucose 100 each 12   metFORMIN  (GLUCOPHAGE -XR) 500 MG 24 hr tablet Take 2 tablets (1,000 mg total) by mouth daily with breakfast. 180 tablet 1   olmesartan -hydrochlorothiazide  (BENICAR  HCT) 40-25 MG tablet Take 1 tablet by mouth daily. 90 tablet 1   prednisoLONE  acetate (PRED FORTE ) 1 % ophthalmic suspension Place 1 drop into the left eye 4 (four) times daily. 10 mL 0   Semaglutide , 1 MG/DOSE, 4 MG/3ML SOPN Inject 1 mg as directed once a week. 3 mL 3   spironolactone  (ALDACTONE ) 25 MG tablet TAKE 1 TABLET(25 MG) BY MOUTH EVERY DAY 90 tablet 0   No current facility-administered medications for this encounter.    REVIEW OF SYSTEMS: As above in HPI.   PHYSICAL EXAM:  vitals were not taken for this visit.   General: Alert and oriented, in no acute distress HEENT: Head is normocephalic. Extraocular movements are intact.  Heart: Regular in rate and rhythm with no murmurs, rubs, or gallops. Chest: Clear to auscultation bilaterally, with no rhonchi, wheezes, or rales. Abdomen: Soft, nontender, nondistended, with no rigidity or guarding. Extremities: No cyanosis or edema. Skin: No concerning lesions. Musculoskeletal: symmetric strength and muscle tone throughout. Neurologic: Cranial nerves II through XII are grossly intact. No obvious focalities. Speech is fluent. Coordination is intact. Psychiatric: Judgment and insight are intact. Affect is appropriate. Breasts: *** . No other palpable masses appreciated in the breasts or axillae *** .    ECOG = ***  0 - Asymptomatic (Fully active, able to carry on all predisease activities without restriction)  1 - Symptomatic but completely ambulatory (Restricted in physically strenuous activity but ambulatory and able to carry out work of a light or sedentary nature. For example, light housework, office work)  2 -  Symptomatic, <50% in bed during the day (Ambulatory and capable of all self care but unable to carry out any work activities. Up and about more than 50% of waking hours)  3 - Symptomatic, >50% in bed, but not bedbound (Capable of only limited self-care, confined to bed or chair 50% or more of waking hours)  4 - Bedbound (Completely disabled. Cannot carry on any self-care. Totally confined to bed or chair)  5 - Death   Raylene MM, Creech RH, Tormey DC, et al. [phone]). Toxicity and response criteria of the [facility]. Am. DOROTHA Bridges. Oncol. 5 (6): 649-55   LABORATORY DATA:   CBC    Component Value Date/Time   WBC 6.4 01/07/2024 1536   WBC 7.6 01/22/2019 1258   RBC 4.52 01/07/2024 1536   RBC 4.61 01/22/2019 1258   HGB 11.8 01/07/2024 1536   HCT 37.5 01/07/2024 1536   PLT 342 01/07/2024 1536   MCV 83 01/07/2024 1536   MCH 26.1 (L) 01/07/2024 1536   MCH 23.0 (L) 03/13/2016 1325   MCHC 31.5 01/07/2024 1536   MCHC 31.7 01/22/2019 1258   RDW 13.5  01/07/2024 1536   LYMPHSABS 2.8 04/21/2023 1642   MONOABS 0.5 01/22/2019 1258   EOSABS 0.1 04/21/2023 1642   BASOSABS 0.1 04/21/2023 1642    CMP     Component Value Date/Time   NA 139 01/07/2024 1536   K 4.2 01/07/2024 1536   CL 100 01/07/2024 1536   CO2 23 01/07/2024 1536   GLUCOSE 101 (H) 01/07/2024 1536   GLUCOSE 314 (H) 03/20/2020 1645   BUN 27 (H) 01/07/2024 1536   CREATININE 0.88 01/07/2024 1536   CREATININE 0.78 03/12/2016 1249   CALCIUM  9.8 01/07/2024 1536   PROT 7.8 04/21/2023 1642   ALBUMIN 4.4 04/21/2023 1642   AST 19 04/21/2023 1642   ALT 7 04/21/2023 1642   ALKPHOS 134 (H) 04/21/2023 1642   BILITOT <0.2 04/21/2023 1642   GFR 86.53 03/20/2020 1645   EGFR 77 01/07/2024 1536   GFRNONAA 67 03/31/2017 1614   GFRNONAA >89 11/03/2014 1258      RADIOGRAPHY: US  RT BREAST BX W LOC DEV 1ST LESION IMG BX SPEC US  GUIDE Addendum Date: 05/20/2024 ADDENDUM REPORT: 05/20/2024 10:08 ADDENDUM: PATHOLOGY  revealed: Site 1. Breast, right, needle core biopsy, 9:30 8 cmfn (ribbon clip)- FOCAL DUCTAL CARCINOMA IN SITU, CRIBRIFORM, INTERMEDIATE NUCLEAR GRADE- NECROSIS: PRESENT- CALCIFICATIONS: NOT IDENTIFIED- DCIS LENGTH: 0.1 CM. Pathology results are CONCORDANT with imaging findings, per Dr. Curtistine Noble. PATHOLOGY revealed: Site 2. Breast, right, needle core biopsy, 12 o'clock 5 cmfn ( heart clip)- INVASIVE DUCTAL CARCINOMA WITH PAPILLARY FEATURES, DUCTAL CARCINOMA IN SITU, PAPILLARY, INTERMEDIATE NUCLEAR GRADE, WITH NECROSIS- OVERALL GRADE: 2- LYMPHOVASCULAR INVASION: PRESENT- CANCER LENGTH: 0.8 CM- CALCIFICATIONS: NOT IDENTIFIED. Pathology results are CONCORDANT with imaging findings, per Dr. Curtistine Noble. PATHOLOGY revealed: Site 3. Lymph node, needle/core biopsy, node (hydromark spiral clip)- LYMPH NODE WITH METASTATIC CARCINOMA. Pathology results are CONCORDANT with imaging findings, per Dr. Curtistine Noble. Pathology results and recommendations below were discussed with patient by telephone. Patient reported biopsy site within normal limits with slight tenderness at the site. Post biopsy care instructions were reviewed, questions were answered and my direct phone number was provided to patient. Patient was instructed to call [facility] if any concerns or questions arise related to the biopsy. RECOMMENDATION: 1. Surgical and oncological consultation. The patient was referred to [facility] with appointment on 05/26/24. 2. Consider right breast skin punch biopsy for skin thickening. 3. Consider pretreatment bilateral breast MRI with and without contrast to determine extent of breast disease. Pathology results reported by Mliss CHARM Molt RN 05/19/2024. Electronically Signed   By: Curtistine Noble   On: 05/20/2024 10:08   Result Date: 05/20/2024 CLINICAL DATA:  59 year old female presents for a 3 site right  breast/axilla ultrasound-guided biopsy. EXAM: ULTRASOUND GUIDED RIGHT BREAST AND AXILLA CORE NEEDLE BIOPSY COMPARISON:  Previous exam(s). PROCEDURE: The procedure was discussed with the patient including benefits and alternatives. We discussed the risks of the procedure, including infection, bleeding, tissue injury and inadequate sampling. Post biopsy clip placement and possible clip migration were also discussed. Informed written consent was given. The usual time-out protocol was performed immediately prior to the procedure. Right breast: Site #1 The patient was scanned and the area of interest in the 930 position, 8 cm from the nipple, was localized which correlates with the area of concern seen on prior imaging studies. This area was targeted for ultrasound guided core needle biopsy. After sterile skin prep and 1% lidocaine  with and without epinephrine for  local anesthesia, a 14 gauge spring-loaded biopsy needle was used under direct ultrasound visualization to obtain several cores of tissue from the mass using a lateral to medial approach. Next, under ultrasound guidance, a Ribbon shaped clip was placed in the sampled mass. Site #2 The patient was scanned and the area of interest in the 12 o' clock position, 5 cm from the nipple, was localized which correlates with the area of concern seen on prior imaging studies. This area was targeted for ultrasound guided core needle biopsy. After sterile skin prep and 1% lidocaine  with and without epinephrine for local anesthesia, a 14 gauge spring-loaded biopsy needle was used under direct ultrasound visualization to obtain several cores of tissue from the mass using a lateral to medial approach. Next, under ultrasound guidance, a Heart shaped clip was placed in the sampled mass. Right Axilla: The patient was scanned and the suspicious mass/lymph node was localized which correlates with the area of concern seen on prior imaging studies. This area was targeted for  ultrasound guided core needle biopsy. After sterile skin prep and 1% lidocaine  with and without epinephrine for local anesthesia, a 14 gauge spring-loaded biopsy needle was used under direct ultrasound visualization to obtain several cores of tissue from the mass using a lateral to medial approach. Next, under ultrasound guidance, HydroMARK spiral clip was placed in the sampled mass. There were no immediate post-procedure complications. A follow up 2 view mammogram was performed and dictated separately. IMPRESSION: Ultrasound guided biopsy of the right breast and axilla as above. No apparent complications. Electronically Signed: By: Curtistine Noble On: 05/18/2024 14:51   US  RT BREAST BX W LOC DEV EA ADD LESION IMG BX SPEC US  GUIDE Addendum Date: 05/20/2024 ADDENDUM REPORT: 05/20/2024 10:08 ADDENDUM: PATHOLOGY revealed: Site 1. Breast, right, needle core biopsy, 9:30 8 cmfn (ribbon clip)- FOCAL DUCTAL CARCINOMA IN SITU, CRIBRIFORM, INTERMEDIATE NUCLEAR GRADE- NECROSIS: PRESENT- CALCIFICATIONS: NOT IDENTIFIED- DCIS LENGTH: 0.1 CM. Pathology results are CONCORDANT with imaging findings, per Dr. Curtistine Noble. PATHOLOGY revealed: Site 2. Breast, right, needle core biopsy, 12 o'clock 5 cmfn ( heart clip)- INVASIVE DUCTAL CARCINOMA WITH PAPILLARY FEATURES, DUCTAL CARCINOMA IN SITU, PAPILLARY, INTERMEDIATE NUCLEAR GRADE, WITH NECROSIS- OVERALL GRADE: 2- LYMPHOVASCULAR INVASION: PRESENT- CANCER LENGTH: 0.8 CM- CALCIFICATIONS: NOT IDENTIFIED. Pathology results are CONCORDANT with imaging findings, per Dr. Curtistine Noble. PATHOLOGY revealed: Site 3. Lymph node, needle/core biopsy, node (hydromark spiral clip)- LYMPH NODE WITH METASTATIC CARCINOMA. Pathology results are CONCORDANT with imaging findings, per Dr. Curtistine Noble. Pathology results and recommendations below were discussed with patient by telephone. Patient reported biopsy site within normal limits with slight tenderness at the site. Post biopsy care  instructions were reviewed, questions were answered and my direct phone number was provided to patient. Patient was instructed to call [facility] if any concerns or questions arise related to the biopsy. RECOMMENDATION: 1. Surgical and oncological consultation. The patient was referred to [facility] with appointment on 05/26/24. 2. Consider right breast skin punch biopsy for skin thickening. 3. Consider pretreatment bilateral breast MRI with and without contrast to determine extent of breast disease. Pathology results reported by Mliss CHARM Molt RN 05/19/2024. Electronically Signed   By: Curtistine Noble   On: 05/20/2024 10:08   Result Date: 05/20/2024 CLINICAL DATA:  59 year old female presents for a 3 site right breast/axilla ultrasound-guided biopsy. EXAM: ULTRASOUND GUIDED RIGHT BREAST AND AXILLA CORE NEEDLE BIOPSY COMPARISON:  Previous exam(s). PROCEDURE: The  procedure was discussed with the patient including benefits and alternatives. We discussed the risks of the procedure, including infection, bleeding, tissue injury and inadequate sampling. Post biopsy clip placement and possible clip migration were also discussed. Informed written consent was given. The usual time-out protocol was performed immediately prior to the procedure. Right breast: Site #1 The patient was scanned and the area of interest in the 930 position, 8 cm from the nipple, was localized which correlates with the area of concern seen on prior imaging studies. This area was targeted for ultrasound guided core needle biopsy. After sterile skin prep and 1% lidocaine  with and without epinephrine for local anesthesia, a 14 gauge spring-loaded biopsy needle was used under direct ultrasound visualization to obtain several cores of tissue from the mass using a lateral to medial approach. Next, under ultrasound guidance, a Ribbon shaped clip was placed  in the sampled mass. Site #2 The patient was scanned and the area of interest in the 12 o' clock position, 5 cm from the nipple, was localized which correlates with the area of concern seen on prior imaging studies. This area was targeted for ultrasound guided core needle biopsy. After sterile skin prep and 1% lidocaine  with and without epinephrine for local anesthesia, a 14 gauge spring-loaded biopsy needle was used under direct ultrasound visualization to obtain several cores of tissue from the mass using a lateral to medial approach. Next, under ultrasound guidance, a Heart shaped clip was placed in the sampled mass. Right Axilla: The patient was scanned and the suspicious mass/lymph node was localized which correlates with the area of concern seen on prior imaging studies. This area was targeted for ultrasound guided core needle biopsy. After sterile skin prep and 1% lidocaine  with and without epinephrine for local anesthesia, a 14 gauge spring-loaded biopsy needle was used under direct ultrasound visualization to obtain several cores of tissue from the mass using a lateral to medial approach. Next, under ultrasound guidance, HydroMARK spiral clip was placed in the sampled mass. There were no immediate post-procedure complications. A follow up 2 view mammogram was performed and dictated separately. IMPRESSION: Ultrasound guided biopsy of the right breast and axilla as above. No apparent complications. Electronically Signed: By: Curtistine Noble On: 05/18/2024 14:51   US  AXILLARY NODE CORE BIOPSY RIGHT Addendum Date: 05/20/2024 ADDENDUM REPORT: 05/20/2024 10:08 ADDENDUM: PATHOLOGY revealed: Site 1. Breast, right, needle core biopsy, 9:30 8 cmfn (ribbon clip)- FOCAL DUCTAL CARCINOMA IN SITU, CRIBRIFORM, INTERMEDIATE NUCLEAR GRADE- NECROSIS: PRESENT- CALCIFICATIONS: NOT IDENTIFIED- DCIS LENGTH: 0.1 CM. Pathology results are CONCORDANT with imaging findings, per Dr. Curtistine Noble. PATHOLOGY revealed: Site 2.  Breast, right, needle core biopsy, 12 o'clock 5 cmfn ( heart clip)- INVASIVE DUCTAL CARCINOMA WITH PAPILLARY FEATURES, DUCTAL CARCINOMA IN SITU, PAPILLARY, INTERMEDIATE NUCLEAR GRADE, WITH NECROSIS- OVERALL GRADE: 2- LYMPHOVASCULAR INVASION: PRESENT- CANCER LENGTH: 0.8 CM- CALCIFICATIONS: NOT IDENTIFIED. Pathology results are CONCORDANT with imaging findings, per Dr. Curtistine Noble. PATHOLOGY revealed: Site 3. Lymph node, needle/core biopsy, node (hydromark spiral clip)- LYMPH NODE WITH METASTATIC CARCINOMA. Pathology results are CONCORDANT with imaging findings, per Dr. Curtistine Noble. Pathology results and recommendations below were discussed with patient by telephone. Patient reported biopsy site within normal limits with slight tenderness at the site. Post biopsy care instructions were reviewed, questions were answered and my direct phone number was provided to patient. Patient was instructed to call [facility] if any concerns or questions arise related to the biopsy. RECOMMENDATION: 1. Surgical and oncological consultation. The patient was  referred to [facility] with appointment on 05/26/24. 2. Consider right breast skin punch biopsy for skin thickening. 3. Consider pretreatment bilateral breast MRI with and without contrast to determine extent of breast disease. Pathology results reported by Mliss CHARM Molt RN 05/19/2024. Electronically Signed   By: Curtistine Noble   On: 05/20/2024 10:08   Result Date: 05/20/2024 CLINICAL DATA:  59 year old female presents for a 3 site right breast/axilla ultrasound-guided biopsy. EXAM: ULTRASOUND GUIDED RIGHT BREAST AND AXILLA CORE NEEDLE BIOPSY COMPARISON:  Previous exam(s). PROCEDURE: The procedure was discussed with the patient including benefits and alternatives. We discussed the risks of the procedure, including infection, bleeding, tissue injury and inadequate sampling.  Post biopsy clip placement and possible clip migration were also discussed. Informed written consent was given. The usual time-out protocol was performed immediately prior to the procedure. Right breast: Site #1 The patient was scanned and the area of interest in the 930 position, 8 cm from the nipple, was localized which correlates with the area of concern seen on prior imaging studies. This area was targeted for ultrasound guided core needle biopsy. After sterile skin prep and 1% lidocaine  with and without epinephrine for local anesthesia, a 14 gauge spring-loaded biopsy needle was used under direct ultrasound visualization to obtain several cores of tissue from the mass using a lateral to medial approach. Next, under ultrasound guidance, a Ribbon shaped clip was placed in the sampled mass. Site #2 The patient was scanned and the area of interest in the 12 o' clock position, 5 cm from the nipple, was localized which correlates with the area of concern seen on prior imaging studies. This area was targeted for ultrasound guided core needle biopsy. After sterile skin prep and 1% lidocaine  with and without epinephrine for local anesthesia, a 14 gauge spring-loaded biopsy needle was used under direct ultrasound visualization to obtain several cores of tissue from the mass using a lateral to medial approach. Next, under ultrasound guidance, a Heart shaped clip was placed in the sampled mass. Right Axilla: The patient was scanned and the suspicious mass/lymph node was localized which correlates with the area of concern seen on prior imaging studies. This area was targeted for ultrasound guided core needle biopsy. After sterile skin prep and 1% lidocaine  with and without epinephrine for local anesthesia, a 14 gauge spring-loaded biopsy needle was used under direct ultrasound visualization to obtain several cores of tissue from the mass using a lateral to medial approach. Next, under ultrasound guidance, HydroMARK spiral  clip was placed in the sampled mass. There were no immediate post-procedure complications. A follow up 2 view mammogram was performed and dictated separately. IMPRESSION: Ultrasound guided biopsy of the right breast and axilla as above. No apparent complications. Electronically Signed: By: Curtistine Noble On: 05/18/2024 14:51   MM CLIP PLACEMENT RIGHT Result Date: 05/18/2024 CLINICAL DATA:  59 year old female status post right breast and right axilla ultrasound-guided biopsy. EXAM: 3D DIAGNOSTIC RIGHT MAMMOGRAM POST ULTRASOUND BIOPSY COMPARISON:  Previous exam(s). ACR Breast Density Category c: The breasts are heterogeneously dense, which may obscure small masses. FINDINGS: 3D Mammographic images were obtained following ultrasound guided biopsy of the right breast and axilla. The biopsy marking clips are in expected position at the site of biopsy. IMPRESSION: 1. Appropriate positioning of the ribbon shaped biopsy marking clip at the site of biopsy in the right breast at the 9:30 position. 2. Appropriate positioning of the heart shaped biopsy marking clip  at the site of biopsy in the right breast at the 12 o'clock position. 3. Appropriate positioning of the spiral shaped biopsy marking clip at the site of biopsy in the right axilla. Final Assessment: Post Procedure Mammograms for Marker Placement Electronically Signed   By: Curtistine Noble   On: 05/18/2024 15:01   MM 3D DIAGNOSTIC MAMMOGRAM BILATERAL BREAST Result Date: 05/14/2024 CLINICAL DATA:  59 year old woman with swelling of the RIGHT nipple-areolar complex for few weeks presents for diagnostic RIGHT and annual LEFT mammogram. EXAM: DIGITAL DIAGNOSTIC BILATERAL MAMMOGRAM WITH TOMOSYNTHESIS AND CAD; ULTRASOUND RIGHT BREAST LIMITED TECHNIQUE: Bilateral digital diagnostic mammography and breast tomosynthesis was performed. The images were evaluated with computer-aided detection. ; Targeted ultrasound examination of the right breast was performed COMPARISON:   Previous exam(s). ACR Breast Density Category c: The breasts are heterogeneously dense, which may obscure small masses. FINDINGS: RIGHT: Mammogram: Full field CC and MLO and spot compression CC views of the RIGHT breast were obtained. Asymmetric thickening of the skin of the areola of the RIGHT breast is present. Oval mass with indistinct margins and spiculations seen in the posterior depth of the upper RIGHT breast. Oval mass with obscured margins seen in the posterior depth of the upper-outer RIGHT breast. Physical examination: On focused examination, there is thickening and pitting of the skin of the areola and adjacent RIGHT breast. Ultrasound: Targeted sonographic evaluation of the RIGHT breast was performed. No mass identified in the retroareolar RIGHT breast. Mass 1: 0.7 x 0.7 x 0.7 cm oval hypoechoic mass with indistinct margins seen at 12 o'clock 5 CMFN, likely corresponds to the mammographic mass at the posterior depth of the upper RIGHT breast. Mass 2: 0.8 x 0.4 x 0.8 cm oval circumscribed hypoechoic mass seen at 9:30 8 CMFN, likely corresponds to the mammographic mass in the posterior depth of the upper-outer RIGHT breast. The following masses do not have definite mammographic correlates. Mass 3: 0.4 x 0.4 x 0.4 cm oval circumscribed hypoechoic mass at 12 o'clock 6 CMFN. Mass 4: 0.5 x 0.4 x 0.4 cm oval hypoechoic mass with angular margins at 12 o'clock 8 CMFN. Mass 5: 0.7 x 0.4 x 0.4 cm oval hypoechoic mass with circumscribed and indistinct margins at 12:30 6 CMFN. Mass 6: 0.4 x 0.3 x 0.3 cm oval circumscribed hypoechoic mass at 11:30 8 CMFN. Enlarged RIGHT axillary lymph node is seen with cortical thickness measuring 1.2 cm. LEFT: Mammogram: No suspicious mass, distortion, or microcalcifications are identified to suggest presence of malignancy. IMPRESSION: 1. Suspicious 0.7 cm RIGHT breast mass (12 o'clock 5 CMFN), likely corresponding to the mammographic mass seen in the upper RIGHT breast. 2.  Suspicious 0.8 cm RIGHT breast mass (9:30 8 CMFN), likely corresponding to the mammographic mass in the upper-outer RIGHT breast. 3. Suspicious abnormally enlarged RIGHT axillary lymph node with cortical thickness measuring 1.2 cm. 4. Multiple additional indeterminate RIGHT breast masses (#3 - #6). 5. Abnormal thickening and pitting of the skin of the RIGHT areola and adjacent breast, suspicious for malignant dermal invasion. 6. No evidence of LEFT breast malignancy. RECOMMENDATION: 1. Ultrasound-guided core needle biopsy of 0.7 cm RIGHT breast mass (12 o'clock 5 CMFN). 2. Ultrasound-guided core needle biopsy of 0.8 cm RIGHT breast mass (9:30 8 CMFN). 3. Ultrasound-guided core needle biopsy of RIGHT axillary lymph node with cortical thickness of 1.2 cm. 4. Management of remaining RIGHT breast masses is pending results from above biopsies. 5. Skin punch biopsy may be required to further evaluate for dermal invasion of the areola and  adjacent breast. I have discussed the findings and recommendations with the patient. The biopsy procedure was explained to the patient and questions were answered. Patient expressed their understanding of the biopsy recommendation. Patient will be scheduled for biopsy at her earliest convenience by the schedulers. Ordering provider will be notified. If applicable, a reminder letter will be sent to the patient regarding the next appointment. BI-RADS CATEGORY  5: Highly suggestive of malignancy. Electronically Signed   By: Aliene Lloyd M.D.   On: 05/14/2024 15:18   US  LIMITED ULTRASOUND INCLUDING AXILLA RIGHT BREAST Result Date: 05/14/2024 CLINICAL DATA:  60 year old woman with swelling of the RIGHT nipple-areolar complex for few weeks presents for diagnostic RIGHT and annual LEFT mammogram. EXAM: DIGITAL DIAGNOSTIC BILATERAL MAMMOGRAM WITH TOMOSYNTHESIS AND CAD; ULTRASOUND RIGHT BREAST LIMITED TECHNIQUE: Bilateral digital diagnostic mammography and breast tomosynthesis was performed. The  images were evaluated with computer-aided detection. ; Targeted ultrasound examination of the right breast was performed COMPARISON:  Previous exam(s). ACR Breast Density Category c: The breasts are heterogeneously dense, which may obscure small masses. FINDINGS: RIGHT: Mammogram: Full field CC and MLO and spot compression CC views of the RIGHT breast were obtained. Asymmetric thickening of the skin of the areola of the RIGHT breast is present. Oval mass with indistinct margins and spiculations seen in the posterior depth of the upper RIGHT breast. Oval mass with obscured margins seen in the posterior depth of the upper-outer RIGHT breast. Physical examination: On focused examination, there is thickening and pitting of the skin of the areola and adjacent RIGHT breast. Ultrasound: Targeted sonographic evaluation of the RIGHT breast was performed. No mass identified in the retroareolar RIGHT breast. Mass 1: 0.7 x 0.7 x 0.7 cm oval hypoechoic mass with indistinct margins seen at 12 o'clock 5 CMFN, likely corresponds to the mammographic mass at the posterior depth of the upper RIGHT breast. Mass 2: 0.8 x 0.4 x 0.8 cm oval circumscribed hypoechoic mass seen at 9:30 8 CMFN, likely corresponds to the mammographic mass in the posterior depth of the upper-outer RIGHT breast. The following masses do not have definite mammographic correlates. Mass 3: 0.4 x 0.4 x 0.4 cm oval circumscribed hypoechoic mass at 12 o'clock 6 CMFN. Mass 4: 0.5 x 0.4 x 0.4 cm oval hypoechoic mass with angular margins at 12 o'clock 8 CMFN. Mass 5: 0.7 x 0.4 x 0.4 cm oval hypoechoic mass with circumscribed and indistinct margins at 12:30 6 CMFN. Mass 6: 0.4 x 0.3 x 0.3 cm oval circumscribed hypoechoic mass at 11:30 8 CMFN. Enlarged RIGHT axillary lymph node is seen with cortical thickness measuring 1.2 cm. LEFT: Mammogram: No suspicious mass, distortion, or microcalcifications are identified to suggest presence of malignancy. IMPRESSION: 1. Suspicious  0.7 cm RIGHT breast mass (12 o'clock 5 CMFN), likely corresponding to the mammographic mass seen in the upper RIGHT breast. 2. Suspicious 0.8 cm RIGHT breast mass (9:30 8 CMFN), likely corresponding to the mammographic mass in the upper-outer RIGHT breast. 3. Suspicious abnormally enlarged RIGHT axillary lymph node with cortical thickness measuring 1.2 cm. 4. Multiple additional indeterminate RIGHT breast masses (#3 - #6). 5. Abnormal thickening and pitting of the skin of the RIGHT areola and adjacent breast, suspicious for malignant dermal invasion. 6. No evidence of LEFT breast malignancy. RECOMMENDATION: 1. Ultrasound-guided core needle biopsy of 0.7 cm RIGHT breast mass (12 o'clock 5 CMFN). 2. Ultrasound-guided core needle biopsy of 0.8 cm RIGHT breast mass (9:30 8 CMFN). 3. Ultrasound-guided core needle biopsy of RIGHT axillary lymph node with cortical  thickness of 1.2 cm. 4. Management of remaining RIGHT breast masses is pending results from above biopsies. 5. Skin punch biopsy may be required to further evaluate for dermal invasion of the areola and adjacent breast. I have discussed the findings and recommendations with the patient. The biopsy procedure was explained to the patient and questions were answered. Patient expressed their understanding of the biopsy recommendation. Patient will be scheduled for biopsy at her earliest convenience by the schedulers. Ordering provider will be notified. If applicable, a reminder letter will be sent to the patient regarding the next appointment. BI-RADS CATEGORY  5: Highly suggestive of malignancy. Electronically Signed   By: Aliene Lloyd M.D.   On: 05/14/2024 15:18      IMPRESSION/PLAN: ***   It was a pleasure meeting the patient today. We discussed the risks, benefits, and side effects of radiotherapy. I recommend radiotherapy to the *** to reduce her risk of locoregional recurrence by 2/3.  We discussed that radiation would take approximately *** weeks to  complete and that I would give the patient a few weeks to heal following surgery before starting treatment planning. *** If chemotherapy were to be given, this would precede radiotherapy. We spoke about acute effects including skin irritation and fatigue as well as much less common late effects including internal organ injury or irritation. We spoke about the latest technology that is used to minimize the risk of late effects for patients undergoing radiotherapy to the breast or chest wall. No guarantees of treatment were given. The patient is enthusiastic about proceeding with treatment. I look forward to participating in the patient's care.  I will await her referral back to me for postoperative follow-up and eventual CT simulation/treatment planning.  On date of service, in total, I spent *** minutes on this encounter. Patient was seen in person.   __________________________________________   Lauraine Golden, MD  This document serves as a record of services personally performed by Lauraine Golden, MD. It was created on her behalf by Dorthy Fuse, a trained medical scribe. The creation of this record is based on the scribe's personal observations and the provider's statements to them. This document has been checked and approved by the attending provider.  "

## 2024-05-26 ENCOUNTER — Inpatient Hospital Stay: Attending: Hematology and Oncology

## 2024-05-26 ENCOUNTER — Encounter: Payer: Self-pay | Admitting: *Deleted

## 2024-05-26 ENCOUNTER — Inpatient Hospital Stay (HOSPITAL_BASED_OUTPATIENT_CLINIC_OR_DEPARTMENT_OTHER): Admitting: Hematology and Oncology

## 2024-05-26 ENCOUNTER — Inpatient Hospital Stay: Admitting: Genetic Counselor

## 2024-05-26 ENCOUNTER — Ambulatory Visit
Admission: RE | Admit: 2024-05-26 | Discharge: 2024-05-26 | Disposition: A | Discharge status: Home / Self Care | Source: Ambulatory Visit | Attending: Radiation Oncology | Admitting: Radiation Oncology

## 2024-05-26 ENCOUNTER — Ambulatory Visit: Attending: Surgery | Admitting: Physical Therapy

## 2024-05-26 ENCOUNTER — Encounter: Payer: Self-pay | Admitting: Physical Therapy

## 2024-05-26 ENCOUNTER — Other Ambulatory Visit: Payer: Self-pay

## 2024-05-26 ENCOUNTER — Ambulatory Visit: Payer: Self-pay | Admitting: Surgery

## 2024-05-26 ENCOUNTER — Encounter: Payer: Self-pay | Admitting: Genetic Counselor

## 2024-05-26 VITALS — BP 143/65 | HR 83 | Temp 98.4°F | Resp 18 | Ht 64.0 in | Wt 198.3 lb

## 2024-05-26 DIAGNOSIS — C50411 Malignant neoplasm of upper-outer quadrant of right female breast: Secondary | ICD-10-CM

## 2024-05-26 DIAGNOSIS — Z808 Family history of malignant neoplasm of other organs or systems: Secondary | ICD-10-CM

## 2024-05-26 DIAGNOSIS — E1140 Type 2 diabetes mellitus with diabetic neuropathy, unspecified: Secondary | ICD-10-CM | POA: Diagnosis not present

## 2024-05-26 DIAGNOSIS — Z170 Estrogen receptor positive status [ER+]: Secondary | ICD-10-CM

## 2024-05-26 DIAGNOSIS — R293 Abnormal posture: Secondary | ICD-10-CM | POA: Insufficient documentation

## 2024-05-26 DIAGNOSIS — Z1379 Encounter for other screening for genetic and chromosomal anomalies: Secondary | ICD-10-CM

## 2024-05-26 LAB — CBC WITH DIFFERENTIAL (CANCER CENTER ONLY)
Abs Immature Granulocytes: 0.01 K/uL (ref 0.00–0.07)
Basophils Absolute: 0 K/uL (ref 0.0–0.1)
Basophils Relative: 1 %
Eosinophils Absolute: 0 K/uL (ref 0.0–0.5)
Eosinophils Relative: 1 %
HCT: 40 % (ref 36.0–46.0)
Hemoglobin: 13 g/dL (ref 12.0–15.0)
Immature Granulocytes: 0 %
Lymphocytes Relative: 30 %
Lymphs Abs: 1.6 K/uL (ref 0.7–4.0)
MCH: 24.8 pg — ABNORMAL LOW (ref 26.0–34.0)
MCHC: 32.5 g/dL (ref 30.0–36.0)
MCV: 76.3 fL — ABNORMAL LOW (ref 80.0–100.0)
Monocytes Absolute: 0.4 K/uL (ref 0.1–1.0)
Monocytes Relative: 8 %
Neutro Abs: 3.2 K/uL (ref 1.7–7.7)
Neutrophils Relative %: 60 %
Platelet Count: 289 K/uL (ref 150–400)
RBC: 5.24 MIL/uL — ABNORMAL HIGH (ref 3.87–5.11)
RDW: 14.5 % (ref 11.5–15.5)
WBC Count: 5.3 K/uL (ref 4.0–10.5)
nRBC: 0 % (ref 0.0–0.2)

## 2024-05-26 LAB — CMP (CANCER CENTER ONLY)
ALT: 7 U/L (ref 0–44)
AST: 24 U/L (ref 15–41)
Albumin: 4.6 g/dL (ref 3.5–5.0)
Alkaline Phosphatase: 147 U/L — ABNORMAL HIGH (ref 38–126)
Anion gap: 15 (ref 5–15)
BUN: 23 mg/dL — ABNORMAL HIGH (ref 6–20)
CO2: 26 mmol/L (ref 22–32)
Calcium: 9.8 mg/dL (ref 8.9–10.3)
Chloride: 96 mmol/L — ABNORMAL LOW (ref 98–111)
Creatinine: 0.92 mg/dL (ref 0.44–1.00)
GFR, Estimated: >60 mL/min
Glucose, Bld: 139 mg/dL — ABNORMAL HIGH (ref 70–99)
Potassium: 3.7 mmol/L (ref 3.5–5.1)
Sodium: 136 mmol/L (ref 135–145)
Total Bilirubin: 0.4 mg/dL (ref 0.0–1.2)
Total Protein: 8.7 g/dL — ABNORMAL HIGH (ref 6.5–8.1)

## 2024-05-26 LAB — GENETIC SCREENING ORDER

## 2024-05-26 MED ORDER — ONDANSETRON HCL 8 MG PO TABS: 8.0000 mg | ORAL_TABLET | Freq: Three times a day (TID) | ORAL | 1 refills | Status: AC | PRN

## 2024-05-26 MED ORDER — LIDOCAINE-PRILOCAINE 2.5-2.5 % EX CREA: TOPICAL_CREAM | CUTANEOUS | 3 refills | Status: AC

## 2024-05-26 MED ORDER — PROCHLORPERAZINE MALEATE 10 MG PO TABS: 10.0000 mg | ORAL_TABLET | Freq: Four times a day (QID) | ORAL | 1 refills | Status: AC | PRN

## 2024-05-26 NOTE — Research (Signed)
 Trial:  [id number]: Complementary Options for Symptom Management In Cancer (COSMIC) Assessing Benefits and Harms of Cannabis and Cannabinoid Use Among a Cohort of Cancer Patients Treated in [facility]  Patient [name] was identified by Dr Gudena as a potential candidate for the above listed study.  This Clinical Research Nurse met with [name], [id number], on 05/26/2024 in a manner and location that ensures patient privacy to discuss participation in the above listed research study.  Patient is Accompanied by her husband.  A copy of the informed consent document and separate HIPAA Authorization was provided to the patient.  Patient reads, speaks, and understands English.   Patient was provided with the business card of this Nurse and encouraged to contact the research team with any questions.  Approximately 5 minutes were spent with the patient reviewing the informed consent documents.  Patient was provided the option of taking informed consent documents home to review and was encouraged to review at their convenience with their support network, including other care providers. Patient took the consent documents home to review. Discussed with patient that [facility] staff will not be aware of her responses to the questionnaires or her use or non-use of cannabis/THC products. Plan follow up call with patient on Monday 05/31/24 at [phone].  Andrea MORTON Elisandra Deshmukh, RN, BSN, [facility] She  Her  [facility]ers Clinical Research Nurse [facility] Direct Dial [phone] 05/26/2024 4:51 PM

## 2024-05-26 NOTE — Research (Signed)
 Trial:  [employer] 2021-05 - Specimen Collection Study to Evaluate Biomarkers in Subjects with Cancer  Patient [name] was identified by Dr Gudena as a potential candidate for the above listed study.  This Clinical Research Nurse met with SHERRISE LIBERTO, [id number], on 05/26/2024 in a manner and location that ensures patient privacy to discuss participation in the above listed research study.  Patient is Accompanied by her husband.  A copy of the informed consent document with embedded HIPAA language was provided to the patient.  Patient reads, speaks, and understands English.   Patient was provided with the business card of this Nurse and encouraged to contact the research team with any questions.  Approximately 5 minutes were spent with the patient reviewing the informed consent documents.  Patient was provided the option of taking informed consent documents home to review and was encouraged to review at their convenience with their support network, including other care providers. Patient took the consent documents home to review. Plan follow up call with patient on Monday 05/31/24 at [phone].  Andrea MORTON Sanaia Jasso, RN, BSN, [facility] She  Her  [facility]ers Clinical Research Nurse [facility] Direct Dial [phone] 05/26/2024 4:48 PM2

## 2024-05-26 NOTE — Addendum Note (Signed)
 Addended by: ODEAN POTTS on: 05/26/2024 03:50 PM   Modules accepted: Orders

## 2024-05-26 NOTE — Progress Notes (Signed)
 START ON PATHWAY REGIMEN - Breast     Cycle 1: A cycle is 21 days:     Pertuzumab-xxxx      Trastuzumab-xxxx      Docetaxel      Carboplatin    Cycles 2 through 6: A cycle is every 21 days:     Pertuzumab-xxxx      Trastuzumab-xxxx      Docetaxel      Carboplatin   **Always confirm dose/schedule in your pharmacy ordering system**  Patient Characteristics: Preoperative or Nonsurgical Candidate, M0 (Clinical Staging), Up to cT4c, Any N, M0, Neoadjuvant Therapy followed by Surgery, Invasive Disease, Chemotherapy, HER2 Positive, ER Positive Therapeutic Status: Preoperative or Nonsurgical Candidate, M0 (Clinical Staging) AJCC T Category: cT1a AJCC N Category: cN1 AJCC M Category: cM0 AJCC Grade: G2 ER Status: Positive (+) PR Status: Negative (-) HER2 Status: Positive (+) AJCC 8 Stage Grouping: IIA Breast Surgical Plan: Neoadjuvant Therapy followed by Surgery Intent of Therapy: Curative Intent, Discussed with Patient
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
## 2024-05-26 NOTE — Assessment & Plan Note (Signed)
 05/18/2024: Patient complained of right nipple swelling, mammogram and ultrasound revealed 6 masses 0.7 cm, 0.8 cm, 0.4 cm, 0.5 status, 0.7 cm, 0.4 cm, 1 abnormal lymph node (biopsy positive) biopsy: Grade 2 IDC with papillary features and intermediate grade DCIS ER 5% PR 0% Ki67 15%, HER2 +3+ by IHC  Pathology and radiology counseling: Discussed with the patient, the details of pathology including the type of breast cancer,the clinical staging, the significance of ER, PR and HER-2/neu receptors and the implications for treatment. After reviewing the pathology in detail, we proceeded to discuss the different treatment options between surgery, radiation, chemotherapy, antiestrogen therapies.  Recommendation based on multidisciplinary tumor board: 1. Neoadjuvant chemotherapy with TCH Perjeta 6 cycles followed by Herceptin Perjeta maintenance versus Kadcyla maintenance (based on response to neoadjuvant chemo) for 1 year 2. Followed by breast conserving surgery versus mastectomy if possible with sentinel lymph node study 3. Followed by adjuvant radiation therapy if patient had lumpectomy 4. +/- Adjuvant antiestrogen therapy  Chemotherapy Counseling: I discussed the risks and benefits of chemotherapy including the risks of nausea/ vomiting, risk of infection from low WBC count, fatigue due to chemo or anemia, bruising or bleeding due to low platelets, mouth sores, loss/ change in taste and decreased appetite. Liver and kidney function will be monitored through out chemotherapy as abnormalities in liver and kidney function may be a side effect of treatment. Cardiac dysfunction due to Herceptin and Perjeta and neuropathy risk from Taxotere were discussed in detail. Risk of permanent bone marrow dysfunction due to chemo were also discussed.  Plan: 1. Port placement 2. Echocardiogram 3. Chemotherapy class 4. Breast MRI  Return to clinic in 2 weeks to start chemotherapy.

## 2024-05-26 NOTE — Progress Notes (Signed)
 REFERRING PROVIDER: Odean Potts, MD [address] [place],  [place] [address]  PRIMARY PROVIDER:  Vicci Barnie NOVAK, MD  PRIMARY REASON FOR VISIT:  1. Family history of brain cancer   2. Malignant neoplasm of upper-outer quadrant of right breast in female, estrogen receptor positive (HCC)      HISTORY OF PRESENT ILLNESS:   [name], a 59 y.o. female, was seen for a  cancer genetics consultation at the request of Dr. Odean due to a personal and family history of cancer.  [name] presents to clinic today to discuss the possibility of a hereditary predisposition to cancer, genetic testing, and to further clarify her future cancer risks, as well as potential cancer risks for family members.   In 2026, at the age of 38, [name] was diagnosed with breast cancer. The treatment plan includes chemotherapy, surgery and radiation.   CANCER HISTORY:  Oncology History  Malignant neoplasm of upper-outer quadrant of right breast in female, estrogen receptor positive (HCC)  05/18/2024 Initial Diagnosis   Patient complained of right nipple swelling, mammogram and ultrasound revealed 6 masses 0.7 cm, 0.8 cm, 0.4 cm, 0.5 status, 0.7 cm, 0.4 cm, 1 abnormal lymph node (biopsy positive) biopsy: Grade 2 IDC with papillary features and intermediate grade DCIS ER 5% PR 0% Ki67 15%, HER2 +3+ by IHC   05/26/2024 Cancer Staging   Staging form: Breast, AJCC 8th Edition - Clinical: Stage IIA (cT1b, cN1, cM0, G2, ER+, PR-, HER2+) - Signed by Odean Potts, MD on 05/26/2024 Stage prefix: Initial diagnosis Histologic grading system: 3 grade system   06/09/2024 -  Chemotherapy   Patient is on Treatment Plan : BREAST  Docetaxel + Carboplatin + Trastuzumab + Pertuzumab  (TCHP) q21d        Past Medical History:  Diagnosis Date   Anemia    CHF (congestive heart failure) (HCC)    Diabetes mellitus    Family history of brain cancer    Heart murmur    Hypertension    Pneumonia     Past  Surgical History:  Procedure Laterality Date   BREAST BIOPSY Right 05/18/2024   US  RT BREAST BX W LOC DEV 1ST LESION IMG BX SPEC US  GUIDE 05/18/2024 [facility]   BREAST BIOPSY Right 05/18/2024   US  RT BREAST BX W LOC DEV EA ADD LESION IMG BX SPEC US  GUIDE 05/18/2024 [facility]   CESAREAN SECTION     TUBAL LIGATION      Social History   Socioeconomic History   Marital status: Married    Spouse name: Not on file   Number of children: Not on file   Years of education: Not on file   Highest education level: Bachelor's degree (e.g., BA, AB, BS)  Occupational History   Occupation: unemployed  Tobacco Use   Smoking status: Never   Smokeless tobacco: Never  Vaping Use   Vaping status: Never Used  Substance and Sexual Activity   Alcohol  use: Yes    Comment: occasional   Drug use: No   Sexual activity: Yes    Birth control/protection: None  Other Topics Concern   Not on file  Social History Narrative   She lives with her husband and daughter who is 104 son who is 14.  Also has 2 grown children and 4 grandchildren.  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   Social Drivers of Health   Tobacco Use: Low Risk (05/26/2024)   Patient History  Smoking Tobacco Use: Never    Smokeless Tobacco Use: Never    Passive Exposure: Not on file  Financial Resource Strain: Low Risk (07/21/2023)   Overall Financial Resource Strain (CARDIA)    Difficulty of Paying Living Expenses: Not hard at all  Food Insecurity: No Food Insecurity (07/21/2023)   Hunger Vital Sign    Worried About Running Out of Food in the Last Year: Never true    Ran Out of Food in the Last Year: Never true  Transportation Needs: No Transportation Needs (07/21/2023)   PRAPARE - Administrator, Civil Service (Medical): No    Lack of Transportation (Non-Medical): No  Physical Activity: Insufficiently Active (07/21/2023)   Exercise Vital Sign    Days of Exercise per Week: 1 day    Minutes of  Exercise per Session: 30 min  Stress: No Stress Concern Present (07/21/2023)   [employer] of Occupational Health - Occupational Stress Questionnaire    Feeling of Stress : Only a little  Social Connections: Moderately Integrated (07/21/2023)   Social Connection and Isolation Panel    Frequency of Communication with Friends and Family: Once a week    Frequency of Social Gatherings with Friends and Family: Once a week    Attends Religious Services: More than 4 times per year    Active Member of Clubs or Organizations: No    Attends Engineer, Structural: More than 4 times per year    Marital Status: Married  Depression (PHQ2-9): Medium Risk (12/30/2023)   Depression (PHQ2-9)    PHQ-2 Score: 6  Alcohol  Screen: Low Risk (07/21/2023)   Alcohol  Screen    Last Alcohol  Screening Score (AUDIT): 1  Housing: Unknown (05/26/2024)   Received from [facility]   Epic    Unable to Pay for Housing in the Last Year: Not on file    Number of Times Moved in the Last Year: Not on file    At any time in the past 12 months, were you homeless or living in a shelter (including now)?: No  Utilities: Not At Risk (07/21/2023)   AHC Utilities    Threatened with loss of utilities: No  Health Literacy: Adequate Health Literacy (07/21/2023)   B1300 Health Literacy    Frequency of need for help with medical instructions: Never     FAMILY HISTORY:  We obtained a detailed, 4-generation family history.  Significant diagnoses are listed below: Family History  Problem Relation Age of Onset   Hypertension Mother    Hypertension Brother    Brain cancer Maternal Uncle        d. 50   Diabetes Neg Hx    Breast cancer Neg Hx      Significant family history includes: Maternal uncle with brain cancer Limited paternal family history  [name] is unaware of previous family history of genetic testing for hereditary cancer risks.  There is no reported Ashkenazi Jewish ancestry. There is no  known consanguinity.  GENETIC COUNSELING ASSESSMENT: [name] is a 59 y.o. female with a personal and family history of cancer which is somewhat suggestive of a hereditary cancer syndrome and predisposition to cancer given limited paternal family history. We, therefore, discussed and recommended the following at today's visit.   DISCUSSION: We discussed that, in general, most cancer is not inherited in families, but instead is sporadic or familial. Sporadic cancers occur by chance and typically happen at older ages (>50 years) as this type of cancer is caused  by genetic changes acquired during an individuals lifetime. Some families have more cancers than would be expected by chance; however, the ages or types of cancer are not consistent with a known genetic mutation or known genetic mutations have been ruled out. This type of familial cancer is thought to be due to a combination of multiple genetic, environmental, hormonal, and lifestyle factors. While this combination of factors likely increases the risk of cancer, the exact source of this risk is not currently identifiable or testable.  We discussed that 5 - 10% of breast cancer is hereditary, with most cases associated with BRCA mutations.  There are other genes that can be associated with hereditary breast cancer syndromes.  These include ATM, CHEK2 and PALB2.  We discussed that testing is beneficial for several reasons including knowing how to follow individuals after completing their treatment, identifying whether potential treatment options such as PARP inhibitors would be beneficial, and understand if other family members could be at risk for cancer and allow them to undergo genetic testing.   We reviewed the characteristics, features and inheritance patterns of hereditary cancer syndromes. We also discussed genetic testing, including the appropriate family members to test, the process of testing, insurance coverage and turn-around-time for  results. We discussed the implications of a negative, positive, carrier and/or variant of uncertain significant result. [name]  was offered a common hereditary cancer panel (36+ genes) and an expanded pan-cancer panel (70+ genes). [name] was informed of the benefits and limitations of each panel, including that expanded pan-cancer panels contain genes that do not have clear management guidelines at this point in time.  We also discussed that as the number of genes included on a panel increases, the chances of variants of uncertain significance increases. [name] decided to pursue genetic testing for the CancerNext-Expanded+RNAinsight gene panel.   The CancerNext-Expanded gene panel offered by [facility] and includes sequencing, rearrangement, and RNA analysis for the following 77 genes: AIP, ALK, APC, ATM, BAP1, BARD1, BMPR1A, BRCA1, BRCA2, BRIP1, CDC73, CDH1, CDK4, CDKN1B, CDKN2A, CEBPA, CHEK2, CTNNA1, DDX41, DICER1, ETV6, FH, FLCN, GATA2, LZTR1, MAX, MBD4, MEN1, MET, MLH1, MSH2, MSH3, MSH6, MUTYH, NF1, NF2, NTHL1, PALB2, PHOX2B, PMS2, POT1, PRKAR1A, PTCH1, PTEN, RAD51C, RAD51D, RB1, RET, RPS20, RUNX1, SDHA, SDHAF2, SDHB, SDHC, SDHD, SMAD4, SMARCA4, SMARCB1, SMARCE1, STK11, SUFU, TMEM127, TP53, TSC1, TSC2, VHL, and WT1 (sequencing and deletion/duplication); AXIN2, CTNNA1, DDX41, EGFR, HOXB13, KIT, MBD4, MITF, MSH3, PDGFRA, POLD1 and POLE (sequencing only); EPCAM and GREM1 (deletion/duplication only). RNA data is routinely analyzed for use in variant interpretation for all genes.   Based on [name]'s personal and family history of cancer, she meets medical criteria for genetic testing.  Despite that she meets criteria, she may still have an out of pocket cost. We discussed that if her out of pocket cost for testing is over $100, the laboratory will call and confirm whether she wants to proceed with testing.  If the out of pocket cost of testing is less than $100 she will be billed by the genetic  testing laboratory.   PLAN: After considering the risks, benefits, and limitations, [name] provided informed consent to pursue genetic testing and the blood sample was sent to [facility] for analysis of the CancerNext-Expanded+RNAinsight. Results should be available within approximately 2-3 weeks' time, at which point they will be disclosed by telephone to [name], as will any additional recommendations warranted by these results. [name] will receive a summary of her genetic counseling visit and  a copy of her results once available. This information will also be available in Epic.   Lastly, we encouraged [name] to remain in contact with cancer genetics annually so that we can continuously update the family history and inform her of any changes in cancer genetics and testing that may be of benefit for this family.   [name]'s questions were answered to her satisfaction today. Our contact information was provided should additional questions or concerns arise. Thank you for the referral and allowing us  to share in the care of your patient.   Estelle Skibicki P. Perri, MS, CGC Licensed, Patent Attorney [email or web address] phone: [phone]  I personally spent a total of 30 minutes in the care of the patient today including preparing to see the patient, getting/reviewing separately obtained history, counseling and educating, placing orders, referring and communicating with other health care professionals, and documenting clinical information in the EHR. he patient brought her husband. Drs. Lanny Stalls, and/or Gudena were available for questions, if needed..    _______________________________________________________________________ For Office Staff:  Number of people involved in session: 2 Was an Intern/ student involved with case: no

## 2024-05-26 NOTE — Research (Signed)
 D7794, ICE COMPRESS: RANDOMIZED TRIAL OF LIMB CRYOCOMPRESSION VERSUS CONTINUOUS COMPRESSION VERSUS LOW CYCLIC COMPRESSION FOR THE PREVENTION  OF TAXANE-INDUCED PERIPHERAL NEUROPATHY  Patient [name] was identified by Dr Gudena as a potential candidate for the above listed study.  This Clinical Research Nurse met with [name], [id number], on 05/26/2024 in a manner and location that ensures patient privacy to discuss participation in the above listed research study.  Patient is Accompanied by her husband.  A copy of the informed consent document and separate HIPAA Authorization was provided to the patient.  Patient reads, speaks, and understands English.   Patient was provided with the business card of this Nurse and encouraged to contact the research team with any questions.  Approximately 10 minutes were spent with the patient reviewing the informed consent documents.  Patient was provided the option of taking informed consent documents home to review and was encouraged to review at their convenience with their support network, including other care providers. Patient took the consent documents home to review.  Plan follow up call with patient on Monday 05/31/24 at [phone].  [name] MORTON [name], RN, BSN, [facility] She  Her  Hers Clinical Research Nurse [facility] Direct Dial [phone] 05/26/2024 4:47 PM

## 2024-05-26 NOTE — Progress Notes (Addendum)
 [facility] CONSULT NOTE  Patient Care Team: Vicci Barnie NOVAK, MD as PCP - General (Internal Medicine) Vanderbilt Ned, MD as Consulting Physician (General Surgery) Tyree Nanetta SAILOR, RN as Oncology Nurse Navigator Odean Potts, MD as Consulting Physician (Hematology and Oncology) Izell Domino, MD as Attending Physician (Radiation Oncology)  CHIEF COMPLAINTS/PURPOSE OF CONSULTATION:  Newly diagnosed breast cancer  HISTORY OF PRESENTING ILLNESS:  Ms. [name] is a 59 year old who complains of right nipple swelling which led to mammogram and ultrasound.  The test revealed 2 masses in the upper outer quadrant initially but by ultrasound there were 6 masses in total ranging from 0.4 cm to 0.8 cm, 1 lymph node was found to be abnormal and biopsy was positive for malignancy.  Biopsy of the mass revealed that this was a grade 2 IDC with papillary features with DCIS intermediate grade ER 5% PR 0% Ki67 15% HER2 3+ positive She was presented this morning to the multidisciplinary tumor board and she is here today to discuss her treatment plan.   I reviewed her records extensively and collaborated the history with the patient.  SUMMARY OF ONCOLOGIC HISTORY: Oncology History  Malignant neoplasm of upper-outer quadrant of right breast in female, estrogen receptor positive (HCC)  05/18/2024 Initial Diagnosis   Patient complained of right nipple swelling, mammogram and ultrasound revealed 6 masses 0.7 cm, 0.8 cm, 0.4 cm, 0.5 status, 0.7 cm, 0.4 cm, 1 abnormal lymph node (biopsy positive) biopsy: Grade 2 IDC with papillary features and intermediate grade DCIS ER 5% PR 0% Ki67 15%, HER2 +3+ by IHC   05/26/2024 Cancer Staging   Staging form: Breast, AJCC 8th Edition - Clinical: Stage IIA (cT1b, cN1, cM0, G2, ER+, PR-, HER2+) - Signed by Odean Potts, MD on 05/26/2024 Stage prefix: Initial diagnosis Histologic grading system: 3 grade system      MEDICAL HISTORY:  Past Medical History:   Diagnosis Date   Anemia    CHF (congestive heart failure) (HCC)    Diabetes mellitus    Heart murmur    Hypertension    Pneumonia     SURGICAL HISTORY: Past Surgical History:  Procedure Laterality Date   BREAST BIOPSY Right 05/18/2024   US  RT BREAST BX W LOC DEV 1ST LESION IMG BX SPEC US  GUIDE 05/18/2024 [facility]   BREAST BIOPSY Right 05/18/2024   US  RT BREAST BX W LOC DEV EA ADD LESION IMG BX SPEC US  GUIDE 05/18/2024 [facility]   CESAREAN SECTION     TUBAL LIGATION      SOCIAL HISTORY: Social History   Socioeconomic History   Marital status: Married    Spouse name: Not on file   Number of children: Not on file   Years of education: Not on file   Highest education level: Bachelor's degree (e.g., BA, AB, BS)  Occupational History   Occupation: unemployed  Tobacco Use   Smoking status: Never   Smokeless tobacco: Never  Vaping Use   Vaping status: Never Used  Substance and Sexual Activity   Alcohol  use: Yes    Comment: occasional   Drug use: No   Sexual activity: Yes    Birth control/protection: None  Other Topics Concern   Not on file  Social History Narrative   She lives with her husband and daughter who is 8 son who is 14.  Also has 2 grown children and 4 grandchildren.  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   Social Drivers of Health  Tobacco Use: Low Risk (05/26/2024)   Patient History    Smoking Tobacco Use: Never    Smokeless Tobacco Use: Never    Passive Exposure: Not on file  Financial Resource Strain: Low Risk (07/21/2023)   Overall Financial Resource Strain (CARDIA)    Difficulty of Paying Living Expenses: Not hard at all  Food Insecurity: No Food Insecurity (07/21/2023)   Hunger Vital Sign    Worried About Running Out of Food in the Last Year: Never true    Ran Out of Food in the Last Year: Never true  Transportation Needs: No Transportation Needs (07/21/2023)   PRAPARE - Administrator, Civil Service  (Medical): No    Lack of Transportation (Non-Medical): No  Physical Activity: Insufficiently Active (07/21/2023)   Exercise Vital Sign    Days of Exercise per Week: 1 day    Minutes of Exercise per Session: 30 min  Stress: No Stress Concern Present (07/21/2023)   [employer] of Occupational Health - Occupational Stress Questionnaire    Feeling of Stress : Only a little  Social Connections: Moderately Integrated (07/21/2023)   Social Connection and Isolation Panel    Frequency of Communication with Friends and Family: Once a week    Frequency of Social Gatherings with Friends and Family: Once a week    Attends Religious Services: More than 4 times per year    Active Member of [employer] or Organizations: No    Attends Engineer, Structural: More than 4 times per year    Marital Status: Married  Catering Manager Violence: Not At Risk (07/21/2023)   Humiliation, Afraid, Rape, and Kick questionnaire    Fear of Current or Ex-Partner: No    Emotionally Abused: No    Physically Abused: No    Sexually Abused: No  Depression (PHQ2-9): Medium Risk (12/30/2023)   Depression (PHQ2-9)    PHQ-2 Score: 6  Alcohol  Screen: Low Risk (07/21/2023)   Alcohol  Screen    Last Alcohol  Screening Score (AUDIT): 1  Housing: Unknown (05/26/2024)   Received from [facility]   Epic    Unable to Pay for Housing in the Last Year: Not on file    Number of Times Moved in the Last Year: Not on file    At any time in the past 12 months, were you homeless or living in a shelter (including now)?: No  Utilities: Not At Risk (07/21/2023)   AHC Utilities    Threatened with loss of utilities: No  Health Literacy: Adequate Health Literacy (07/21/2023)   B1300 Health Literacy    Frequency of need for help with medical instructions: Never    FAMILY HISTORY: Family History  Problem Relation Age of Onset   Hypertension Mother    Hypertension Brother    Diabetes Neg Hx    Breast cancer Neg Hx      ALLERGIES:  has no known allergies.  MEDICATIONS:  Current Outpatient Medications  Medication Sig Dispense Refill   amLODipine  (NORVASC ) 10 MG tablet Take 1 tablet (10 mg total) by mouth daily. 90 tablet 1   ferrous sulfate  (FEROSUL) 325 (65 FE) MG tablet Take 1 tablet (325 mg total) by mouth 2 (two) times daily with a meal. TAKE 1 TABLET(325 MG) BY MOUTH TWICE DAILY WITH A MEAL 180 tablet 1   glucose blood test strip 1 each by Other route daily before breakfast. Use 1 strip daily to check fasting blood glucose 100 each 12   metFORMIN  (GLUCOPHAGE -XR) 500  MG 24 hr tablet Take 2 tablets (1,000 mg total) by mouth daily with breakfast. 180 tablet 1   olmesartan -hydrochlorothiazide  (BENICAR  HCT) 40-25 MG tablet Take 1 tablet by mouth daily. 90 tablet 1   prednisoLONE  acetate (PRED FORTE ) 1 % ophthalmic suspension Place 1 drop into the left eye 4 (four) times daily. 10 mL 0   Semaglutide , 1 MG/DOSE, 4 MG/3ML SOPN Inject 1 mg as directed once a week. 3 mL 3   spironolactone  (ALDACTONE ) 25 MG tablet TAKE 1 TABLET(25 MG) BY MOUTH EVERY DAY 90 tablet 0   No current facility-administered medications for this visit.    REVIEW OF SYSTEMS:   Constitutional: Denies fevers, chills or abnormal night sweats Breast: Nipple swelling All other systems were reviewed with the patient and are negative.  PHYSICAL EXAMINATION: ECOG PERFORMANCE STATUS: 1 - Symptomatic but completely ambulatory  Vitals:   05/26/24 1241  BP: (!) 143/65  Pulse: 83  Resp: 18  Temp: 98.4 F (36.9 C)  SpO2: 100%   There were no vitals filed for this visit.  GENERAL:alert, no distress and comfortable   LABORATORY DATA:  I have reviewed the data as listed Lab Results  Component Value Date   WBC 5.3 05/26/2024   HGB 13.0 05/26/2024   HCT 40.0 05/26/2024   MCV 76.3 (L) 05/26/2024   PLT 289 05/26/2024   Lab Results  Component Value Date   NA 136 05/26/2024   K 3.7 05/26/2024   CL 96 (L) 05/26/2024   CO2 26  05/26/2024    RADIOGRAPHIC STUDIES: I have personally reviewed the radiological reports and agreed with the findings in the report.  ASSESSMENT AND PLAN:  Malignant neoplasm of upper-outer quadrant of right breast in female, estrogen receptor positive (HCC) 05/18/2024: Patient complained of right nipple swelling, mammogram and ultrasound revealed 6 masses 0.7 cm, 0.8 cm, 0.4 cm, 0.5 status, 0.7 cm, 0.4 cm, 1 abnormal lymph node (biopsy positive) biopsy: Grade 2 IDC with papillary features and intermediate grade DCIS ER 5% PR 0% Ki67 15%, HER2 +3+ by IHC  Pathology and radiology counseling: Discussed with the patient, the details of pathology including the type of breast cancer,the clinical staging, the significance of ER, PR and HER-2/neu receptors and the implications for treatment. After reviewing the pathology in detail, we proceeded to discuss the different treatment options between surgery, radiation, chemotherapy, antiestrogen therapies.  Recommendation based on multidisciplinary tumor board: 1. Neoadjuvant chemotherapy with TCH Perjeta 6 cycles followed by Herceptin Perjeta maintenance versus Kadcyla maintenance (based on response to neoadjuvant chemo) for 1 year 2. Followed by breast conserving surgery versus mastectomy if possible with sentinel lymph node study 3. Followed by adjuvant radiation therapy if patient had lumpectomy 4. +/- Adjuvant antiestrogen therapy  Chemotherapy Counseling: I discussed the risks and benefits of chemotherapy including the risks of nausea/ vomiting, risk of infection from low WBC count, fatigue due to chemo or anemia, bruising or bleeding due to low platelets, mouth sores, loss/ change in taste and decreased appetite. Liver and kidney function will be monitored through out chemotherapy as abnormalities in liver and kidney function may be a side effect of treatment. Cardiac dysfunction due to Herceptin and Perjeta and neuropathy risk from Taxotere were  discussed in detail. Risk of permanent bone marrow dysfunction due to chemo were also discussed.  Plan: 1. Port placement 2. Echocardiogram 3. Chemotherapy class 4. Breast MRI  Diabetes: With slight neuropathy in the left hand: I discussed with her that we will not give her  any steroids because she is diabetic.  I also instructed her to be extremely careful with her sugar management and be in touch with her primary care physician for active management of diabetes during chemo.  Return to clinic in 2 weeks to start chemotherapy.   All questions were answered. The patient knows to call the clinic with any problems, questions or concerns. I personally spent a total of 60 minutes in the care of the patient today including preparing to see the patient, getting/reviewing separately obtained history, performing a medically appropriate exam/evaluation, counseling and educating, placing orders, referring and communicating with other health care professionals, documenting clinical information in the EHR, independently interpreting results, communicating results, and coordinating care.   Viinay K Lindalee Huizinga, MD 05/26/2024

## 2024-05-27 ENCOUNTER — Encounter: Payer: Self-pay | Admitting: Hematology and Oncology

## 2024-05-27 ENCOUNTER — Other Ambulatory Visit: Payer: Self-pay

## 2024-05-28 ENCOUNTER — Other Ambulatory Visit: Payer: Self-pay

## 2024-05-31 ENCOUNTER — Telehealth: Payer: Self-pay

## 2024-05-31 NOTE — Telephone Encounter (Signed)
 [id number], ICE COMPRESS: RANDOMIZED TRIAL OF LIMB CRYOCOMPRESSION VERSUS CONTINUOUS COMPRESSION VERSUS LOW CYCLIC COMPRESSION FOR THE PREVENTION  OF TAXANE-INDUCED PERIPHERAL NEUROPATHY   Spoke with patient via phone; she is not interested in this study.  Andrea MORTON Emanii Bugbee, RN, BSN, [facility] She  Her  Hers Clinical Research Nurse [facility] Direct Dial [phone] 05/31/2024 3:43 PM

## 2024-05-31 NOTE — Telephone Encounter (Signed)
 [phone]: Complementary Options for Symptom Management In Cancer (COSMIC) Assessing Benefits and Harms of Cannabis and Cannabinoid Use Among a Cohort of Cancer Patients Treated in [employer] with pt via phone. She is not interested in this study.  Andrea MORTON Shateka Petrea, RN, BSN, [facility] She  Her  Hers Clinical Research Nurse [facility] Direct Dial [phone] 05/31/2024 3:45 PM

## 2024-05-31 NOTE — Progress Notes (Signed)
 Radiation Oncology Consult Note  [name] MRN: [id number]  REFERRING PROVIDER: Self  PCP: Vicci Barnie NOVAK, MD  Surgical Oncologist: Dr. Annitta Carwin Medical Oncologist: Dr. Shanna Herd PLASTIC SURGEON: ---  Diagnosis:  Cancer Staging <redacted file path>  No matching staging information was found for the patient.   REASON FOR CONSULTATION: 59 y.o. 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  CHIEF COMPLAINT: No chief complaint on file.   HPI: [name] is a 59 y.o. female who presents for evaluation of new multifocal right breast cancer.   She initially presented with concern for a right nipple discharge and nigpple inversion. Her most recent previous MMG was November 2024. This prompted additional oncologic workup as follows:  Initially presented with palpable mass and nipple discharge. Diagnostic mammogram, bilateral breast(s) Date: 05/14/24 Institution: [facility] FINDINGS: Thickening of the right breast areola, oval mass with indistinct margins and spiculation in the right breast.  Targeted breast ultrasound, RIGHT breast(s)/axilla Date: 05/14/24 Institution: [facility]  FINDINGS: 0.7 cm mass at 12:00 5 CMFN- with MMG correlate  0.8 cm mass at 9:30 8 cmfn- with MMG correlate  O.4 cm mass at 12:00 6 cmfn - without MMG correlate 0.5 cm mass 12:00 8 cmfn - without MMG correlate 0.7 cm mass 12:30 6 CMFN - without MMG correlate 0.4 cm mass at 11:30 8 cmnf - without MMG correlate Enlarged right axillary LN with cortical thickening measuring 1.2 cm  biopsy of the RIGHT breast,  Date: 05/19/23 Institution: [facility]  RESULTS: Right breast CNB 1200, 5 cfn: IDC with papillary features grade 2 ER 5% PR 0% HER2 IHC 3+ Right axillary LN: lymph node consistent with metastatic  carcinoma  Right breast CNB 930, 8 cfn: DCIS [facility] interpretation of outside breast imaging: 05/28/24 Two biopsy proven malignancies in the right breast at the 12 o'clock position and 9:30 position.  These masses are at least 6.8 cm apart. Diffuse thickening of the skin over the right breast may be due to lymphatic obstruction due to axillary adenopathy versus inflammatory component to the malignancy.  Recommend decision regarding skin punch biopsy based on clinical evaluation. Subtle architectural distortion in the upper outer quadrant of the right breast.  If breast conservation is considered, given this vague distortion and the diffuse skin thickening in the setting of known malignancy, consider MRI to better evaluate extent of disease and to identify discrete targets for additional needle biopsies to confirm extent. If MRI is not performed, and if breast conservation is considered, recommend ultrasound-guided needle biopsy of the 4 mm mass at the 12 o'clock position, 8 cm FN and the 4 mm mass at the 11:30 position, 8 cm FN.  Although these are small, they are similar in morphology to already biopsy-proven malignancies and are most distant from current biopsy marking clips. Normal left breast. She was started on [facility] with Dr Odean on 05/26/24  Oncology History  Breast cancer metastasized to axillary lymph node, right (CMS/HHS-HCC)  05/2024 Initial Diagnosis   Breast cancer metastasized to axillary lymph node, right (CMS/HHS-HCC)   05/14/2024 Radiology Study   Bilateral Diagnostic Mammogram and Right US : CLINICAL DATA:  59 year old woman with swelling of the RIGHT  nipple-areolar complex for few weeks presents for diagnostic RIGHT and annual LEFT mammogram.   ACR Breast Density Category c: The  breasts are heterogeneously  dense, which may obscure small masses.   FINDINGS:  RIGHT:  Mammogram: Full field CC and MLO and spot compression CC views of the RIGHT breast were obtained.   Asymmetric thickening of the skin of the areola of the RIGHT breast is present.  Oval mass with indistinct margins and spiculations seen in the  posterior depth of the upper RIGHT breast.  Oval mass with obscured margins seen in the posterior depth of the upper-outer RIGHT breast.   Physical examination: On focused examination, there is thickening  and pitting of the skin of the areola and adjacent RIGHT breast.   Ultrasound: Targeted sonographic evaluation of the RIGHT breast was performed.  No mass identified in the retroareolar RIGHT breast.  Mass 1: 0.7 x 0.7 x 0.7 cm oval hypoechoic mass with indistinct  margins seen at 12 o'clock 5 CMFN, likely corresponds to the  mammographic mass at the posterior depth of the upper RIGHT breast.  Mass 2: 0.8 x 0.4 x 0.8 cm oval circumscribed hypoechoic mass seen at 9:30 8 CMFN, likely corresponds to the mammographic mass in the posterior depth of the upper-outer RIGHT breast.   The following masses do not have definite mammographic correlates.  Mass 3: 0.4 x 0.4 x 0.4 cm oval circumscribed hypoechoic mass at 12 o'clock 6 CMFN.  Mass 4: 0.5 x 0.4 x 0.4 cm oval hypoechoic mass with angular margins at 12 o'clock 8 CMFN.  Mass 5: 0.7 x 0.4 x 0.4 cm oval hypoechoic mass with circumscribed  and indistinct margins at 12:30 6 CMFN.  Mass 6: 0.4 x 0.3 x 0.3 cm oval circumscribed hypoechoic mass at  11:30 8 CMFN.  Enlarged RIGHT axillary lymph node is seen with cortical thickness  measuring 1.2 cm.   LEFT:  Mammogram: No suspicious mass, distortion, or microcalcifications are identified to suggest presence of malignancy.   IMPRESSION:  1. Suspicious 0.7 cm RIGHT breast mass (12 o'clock 5 CMFN), likely  corresponding to the mammographic mass seen in the upper RIGHT breast.  2. Suspicious 0.8 cm RIGHT breast mass (9:30 8 CMFN), likely  corresponding to the mammographic mass in the upper-outer RIGHT breast.  3. Suspicious abnormally enlarged RIGHT  axillary lymph node with  cortical thickness measuring 1.2 cm.  4. Multiple additional indeterminate RIGHT breast masses (#3 - #6).  5. Abnormal thickening and pitting of the skin of the RIGHT areola  and adjacent breast, suspicious for malignant dermal invasion.  6. No evidence of LEFT breast malignancy.   BI-RADS CATEGORY  5: Highly suggestive of malignancy.   [place]    05/18/2024 Biopsy   1. Breast, right, needle core biopsy, 9:30 8cmfn (ribbon clip) :  FOCAL DUCTAL CARCINOMA IN SITU, CRIBRIFORM, INTERMEDIATE NUCLEAR GRADE       NECROSIS: PRESENT       CALCIFICATIONS: NOT IDENTIFIED       DCIS LENGTH: 0.1 CM       SEE NOTE  The tumor cells are positive for Her2 (3+).  Estrogen Receptor:  5%, positive, weak staining intensity  Progesterone Receptor:  0%, negative  Proliferation Marker Ki67: 15%   2. Breast, right, needle core biopsy, 12 o'clock 5cmfn ( heart clip) :  INVASIVE DUCTAL CARCINOMA WITH PAPILLARY FEATURES, SEE NOTE   DUCTAL CARCINOMA IN SITU, PAPILLARY, INTERMEDIATE NUCLEAR GRADE, WITH NECROSIS       TUBULE FORMATION: SCORE 3       NUCLEAR PLEOMORPHISM: SCORE 3       MITOTIC COUNT:  SCORE 1       TOTAL SCORE: 7       OVERALL GRADE: 2       LYMPHOVASCULAR INVASION: PRESENT       CANCER LENGTH: 0.8 CM       CALCIFICATIONS: NOT IDENTIFIED       OTHER FINDINGS: NONE       SEE NOTE   3. Lymph node, needle/core biopsy, node (hydromark spiral clip) :       LYMPH NODE WITH METASTATIC CARCINOMA   [place]      Today she reports feeling well overall. She denies new or worsening headache, abrupt changes to vision or hearing, unintentional weight loss, new or worsening bone pain, back pain, or joint pain, fevers or chills. She has not noted any new or unusual skin rashes or nodules, new lumps or bumps anywhere. She denies new or unusual weakness, incoordination, or focal neurological complaints. She denies shortness of breath, cough, or other respiratory symptoms.  She is healing well after biopsy.  History otherwise notable for HTN and DM2 on ozempic .  ROS: 10-point review negative except as noted in HPI.  PAST MEDICAL HISTORY:  Past Medical History:  Diagnosis Date   Anemia    CHF (congestive heart failure) (CMS/HHS-HCC)    Diabetes mellitus without complication (CMS/HHS-HCC)    Hypertension      Past Surgical History:  Procedure Laterality Date   CESAREAN SECTION     LAPAROSCOPIC TUBAL LIGATION     Right breast biopsy      Prior radiotherapy: no Connective tissue disorder or inflammatory bowel disease: no Pacemaker/AICD: no Pre-simulation pregnancy test needed: YES Oncofertility referral needed: no Genetics referral needed:  Genetic testing has not been done to date   GYNECOLOGIC HISTORY:  Menarche: 12 Postmenopausal: no LMP: last spotting in november Hormonal birth control: no HRT: no Fertility treatments: no Obstetric history: G19P4 Age at first term pregnancy: 25 Breast fed: yes     * No data to display           CURRENT MEDICATIONS: Current Outpatient Medications  Medication Sig Dispense Refill   amLODIPine  (NORVASC ) 10 MG tablet Take 10 mg by mouth once daily     olmesartan -hydroCHLOROthiazide  (BENICAR  HCT) 40-25 mg tablet Take 1 tablet by mouth once daily     semaglutide  (OZEMPIC ) 1 mg/dose (4 mg/3 mL) pen injector Inject 1 mg as directed     spironolactone  (ALDACTONE ) 25 MG tablet Take 25 mg by mouth once daily     No current facility-administered medications for this encounter.     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  Current living arrangement: lives in [place], [place] with husband and 3 children Substance use: No tobacco use; No alcohol  use; No other substance use  FAMILY HISTORY: Family History  Problem Relation Name Age of Onset   High blood pressure (Hypertension) Mother     Diabetes Brother     High blood pressure (Hypertension) Brother       PHYSICAL EXAM: Bra Size:42 C BP (!) 144/64 (BP Location: Left upper arm, Patient Position: Sitting, BP Cuff Size: Large Adult)   Pulse 90   Temp 36.4 C (97.5 F) (Oral)   Resp 20   Wt 90 kg (198 lb 6.6 oz)   SpO2 100% There is no height or weight on file to calculate BMI.  ECOG: (0) Fully active, able to carry on all predisease performance without restriction; pain 0 General Appearance: Well appearing,  no acute distress. HEENT: Normocephalic, atraumatic, sclera anicteric. Respiratory: Normal respiratory effort on room air. Speaking in full sentences. Cardiovascular: Regular rate, warm and well perfused. Gastrointestinal: Abdomen is non-distended. MSK: No cyanosis or lymphedema. Normal ROM of both upper extremities at the shoulders. Skin: Normal color and texture, no rashes appreciated on exposed skin. Neurologic: Alert and conversant, speech fluent, face symmetrical, moves all extremities equally. Breasts/CW: Chaperone declined. Breasts examined seated and supine. Symmetric in size and appearance. Right breast nipple inversion. Left breast without abnormal nodularity or skin changes.  Lymph Nodes: No palpable axillary, cervical, infraclavicular, or supraclavicular lymph nodes.   Psych: Appropriate affect.  PE Chaperone note: A chaperone was offered for the sensitive portions of the exam but the patient/family declined   IMAGING: Relevant images were personally reviewed, with pertinent findings as summarized in the history above.   LABS/PATHOLOGY: Relevant studies were personally reviewed, with pertinent findings as summarized in the history above.  Impression and Plan: [name] is 59 y.o. female with a new diagnosis of multifocal ( 6 foci largest 8 mm) cT1bcN1 RIGHT breast cancer, ER 5%/PR-/HER2+; status post biopsy (right breast and LN).  DISCUSSION: We are meeting for the first time today. We had a detailed discussion regarding her diagnosis, recent test results, and  therapeutic options.    We reviewed general treatment for breast cancer including surgery, systemic therapy, and radiation therapy, and the goals of each therapeutic modality. We focused our discussion on adjuvant radiation therapy.   We reviewed options for local therapy of breast cancer including mastectomy versus breast conserving therapy (BCT). We discussed the equivalent overall survival and comparable local control associated with these two options. We discussed the general use of radiotherapy in the postmastectomy setting (PMRT), including disease characteristics that are associated with an increased risk for local recurrence that would indicate a benefit from adjuvant radiotherapy. Classic indictors include large tumor size and/or nodal positivity. Additional risk factors to consider in estimating risk of locoregional recurrence for a particular patient include biomarker status, age, lymphovascular invasion, and margin status. We will plan to revisit this discussion once her final surgical pathology is available in order to estimate the particular benefit of PMRT to her but based on her overall clinical picture, we anticipate recommending a course of PMRT. Alternatively, following NAC she may be a candidate for breast conserving surgery and in this setting, we also anticipate recommending a course of adjuvant radiotherapy to decrease her risk for recurrence.  We discussed some of the logistics and potential side effects of radiotherapy for breast cancer. There is a risk for acute fatigue and skin irritation with erythema, hyperpigmentation, or desquamation that may be itchy or painful. Long-term there is a risk for hyperpigmentation, fibrosis, or telangiectasias that may permanently impact cosmetic outcome, or any future reconstruction options (potentially impacting the timing of these options). There is also a smaller risk for lung toxicity, cardiac toxicity, lymphedema, musculoskeletal changes,  brachial plexopathy, rib fragility, or rarely induction of a secondary malignancy. She had the opportunity to ask questions.  [name] met with [facility] medical oncology team at [facility] who also recommended NAC.   PLAN: We are happy to see her following NACT and surgical resection to review surgical pathology and finalize recommendations for radiotherapy Further diagnostic testing recommended: she is scheduled for MRI breast with [place] ; recommend CT CAP and bone scan.   Our contact information was provided to the patient and she was encouraged to contact our office at any time  with questions or concerns that may arise.   Attestation Statement:   I personally performed the service. (TP)  JULIUS SHANNA MORTON, MD  I spent a total of 90 minutes in both face-to-face and non-face-to-face activities, excluding procedures performed, for this visit on the date of this encounter.  Future Appointments  Date Time Provider Department Center  07/01/2024 10:00 AM Cornett, Debby Faden, MD [facility] [place] [facility]    *Some images could not be shown.

## 2024-05-31 NOTE — Telephone Encounter (Signed)
 [employer] 2021-05 - Specimen Collection Study to Evaluate Biomarkers in Subjects with Cancer   Spoke with patient via phone. She is interested in pursuing enrollment in this study. Plan is for [facility]; getting port Jul 01 2024. Plan to watch for date of first chemo.  Patient's husband is not eligible for study due to medical history.  Andrea MORTON Brynnleigh Mcelwee, RN, BSN, [facility] She  Her  Hers Clinical Research Nurse [facility] Direct Dial [phone] 05/31/2024 3:46 PM

## 2024-06-02 ENCOUNTER — Other Ambulatory Visit: Payer: Self-pay | Admitting: Hematology and Oncology

## 2024-06-02 ENCOUNTER — Telehealth: Payer: Self-pay | Admitting: Genetic Counselor

## 2024-06-02 ENCOUNTER — Ambulatory Visit (HOSPITAL_COMMUNITY)

## 2024-06-02 DIAGNOSIS — Z170 Estrogen receptor positive status [ER+]: Secondary | ICD-10-CM

## 2024-06-02 DIAGNOSIS — Z1379 Encounter for other screening for genetic and chromosomal anomalies: Secondary | ICD-10-CM | POA: Insufficient documentation

## 2024-06-02 NOTE — Telephone Encounter (Signed)
 I contacted  [name] to discuss her genetic testing results. No pathogenic variants were identified in the 77 genes analyzed. 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 Discussed that we do not know why she has cancer. It could be due to a different gene that we are not testing, or maybe our current technology may not be able to pick something up. We review that a VUS is treated as a negative result and should not be used to inform testing for family members or change her medical management. We will contact her in the future when the result is reclassified. It will be important for her to keep in contact with genetics to keep up with whether additional testing may be needed. Detailed clinic note to follow.   The test report will be scanned into EPIC and will be located under the Molecular Pathology section of the Results Review tab.  A portion of the result report is included below for reference.

## 2024-06-03 ENCOUNTER — Encounter: Payer: Self-pay | Admitting: General Practice

## 2024-06-03 NOTE — Progress Notes (Signed)
 [facility] Multidisciplinary Clinic Spiritual Care Note  Met with Shirin briefly by phone following Breast Multidisciplinary Clinic to introduce Support Center team/resources.  She completed SDOH screening; results follow below.  SDOH Interventions    Flowsheet Row Office Visit from 07/21/2023 in [facility] [place] - [facility] Of [place]. [facility] Office Visit from 10/11/2021 in [facility] [place]. [facility] Office Visit from 09/18/2021 in [facility] Of [place]. [facility] Video Visit from 07/13/2020 in [facility]  SDOH Interventions      Food Insecurity Interventions Intervention Not Indicated -- -- --  Housing Interventions Intervention Not Indicated -- -- --  Transportation Interventions Intervention Not Indicated -- -- --  Utilities Interventions Intervention Not Indicated -- -- --  Alcohol  Usage Interventions Intervention Not Indicated (Score <7) -- -- --  Depression Interventions/Treatment  -- Patient refuses Treatment Patient refuses Treatment Medication  Financial Strain Interventions Intervention Not Indicated -- -- --  Physical Activity Interventions [employer] -- -- --  Stress Interventions Intervention Not Indicated -- -- --  Social Connections Interventions Intervention Not Indicated -- -- --  Health Literacy Interventions Intervention Not Indicated -- -- --    SDOH Screenings   Food Insecurity: No Food Insecurity (06/03/2024)  Housing: Low Risk (06/03/2024)  Transportation Needs: No Transportation Needs (06/03/2024)  Utilities: Not At Risk (06/03/2024)  Alcohol  Screen: Low Risk (07/21/2023)  Depression (PHQ2-9): Low Risk (06/03/2024)  Financial Resource Strain: Low Risk (07/21/2023)  Physical Activity: Insufficiently Active (07/21/2023)  Social Connections: Moderately Integrated (07/21/2023)  Stress: No Stress Concern Present  (07/21/2023)  Tobacco Use: Low Risk  (06/02/2024)   Received from [facility]  Health Literacy: Adequate Health Literacy (07/21/2023)    Follow up needed: Yes.   We plan to follow up by phone (after 4pm is best due to her work schedule) to speak in more detail about common feelings related to diagnosis and treatment, as well as support team and resources available through [employer] (and [employer]).  [address] [place], [place], [facility] Pager [phone] Voicemail [phone]

## 2024-06-04 ENCOUNTER — Inpatient Hospital Stay

## 2024-06-04 ENCOUNTER — Telehealth: Payer: Self-pay | Admitting: *Deleted

## 2024-06-04 ENCOUNTER — Other Ambulatory Visit: Payer: Self-pay | Admitting: Pharmacist

## 2024-06-04 ENCOUNTER — Encounter: Payer: Self-pay | Admitting: *Deleted

## 2024-06-04 ENCOUNTER — Ambulatory Visit: Admission: RE | Admit: 2024-06-04 | Source: Ambulatory Visit

## 2024-06-04 DIAGNOSIS — C50411 Malignant neoplasm of upper-outer quadrant of right female breast: Secondary | ICD-10-CM

## 2024-06-04 MED ORDER — GADOPICLENOL 0.5 MMOL/ML IV SOLN
9.0000 mL | Freq: Once | INTRAVENOUS | Status: AC | PRN
  Administered 2024-06-04: 9 mL via INTRAVENOUS

## 2024-06-04 NOTE — Telephone Encounter (Signed)
 Spoke with patient to follow up from [facility] 1/14 and assess navigation needs.  Discussed timing of chemo start.  Patient states she chose the date for her port of 2/19 due to scheduling restraints and work and what works best for her.  I informed her we could start treatment next day 2/20 so the port could be left accessed.  Patient verbalized understanding. Encouraged her to call with any additional questions or concerns.

## 2024-06-05 ENCOUNTER — Other Ambulatory Visit: Payer: Self-pay

## 2024-06-07 ENCOUNTER — Other Ambulatory Visit: Payer: Self-pay | Admitting: *Deleted

## 2024-06-07 ENCOUNTER — Ambulatory Visit: Payer: Self-pay | Admitting: Genetic Counselor

## 2024-06-07 ENCOUNTER — Encounter: Payer: Self-pay | Admitting: General Practice

## 2024-06-07 DIAGNOSIS — Z1379 Encounter for other screening for genetic and chromosomal anomalies: Secondary | ICD-10-CM

## 2024-06-07 NOTE — Progress Notes (Signed)
 [facility] Spiritual Care Note  Followed up with Ms Isabell by phone. Introduced Spiritual Care as part of the [facility] patient and family care team, reminding Ms Standre that chaplain's brochure and direct contact information are in the [facility] info packet, so she is able to reach out anytime. Further, we plan to follow up in person at her first treatment.  [address] [place], [place], [facility] Pager [phone] Voicemail [phone]

## 2024-06-07 NOTE — Progress Notes (Signed)
 HPI:  Ms. Hunger was previously seen in the [facility] due to a personal and family history of cancer and concerns regarding a hereditary predisposition to cancer. Please refer to our prior cancer genetics clinic note for more information regarding our discussion, assessment and recommendations, at the time. Ms. Crow's recent genetic test results were disclosed to her, as were recommendations warranted by these results. These results and recommendations are discussed in more detail below.  CANCER HISTORY:  Oncology History  Malignant neoplasm of upper-outer quadrant of right breast in female, estrogen receptor positive (HCC)  05/18/2024 Initial Diagnosis   Patient complained of right nipple swelling, mammogram and ultrasound revealed 6 masses 0.7 cm, 0.8 cm, 0.4 cm, 0.5 status, 0.7 cm, 0.4 cm, 1 abnormal lymph node (biopsy positive) biopsy: Grade 2 IDC with papillary features and intermediate grade DCIS ER 5% PR 0% Ki67 15%, HER2 +3+ by IHC   05/26/2024 Cancer Staging   Staging form: Breast, AJCC 8th Edition - Clinical: Stage IIA (cT1b, cN1, cM0, G2, ER+, PR-, HER2+) - Signed by Odean Potts, MD on 05/26/2024 Stage prefix: Initial diagnosis Histologic grading system: 3 grade system   06/01/2024 Genetic Testing   Negative genetic testing. RAD51C c.664C>G VUS. Report date is 06/01/2024.  The CancerNext-Expanded gene panel offered by [facility] and includes sequencing, rearrangement, and RNA analysis for the following 77 genes: AIP, ALK, APC, ATM, BAP1, BARD1, BMPR1A, BRCA1, BRCA2, BRIP1, CDC73, CDH1, CDK4, CDKN1B, CDKN2A, CEBPA, CHEK2, CTNNA1, DDX41, DICER1, ETV6, FH, FLCN, GATA2, LZTR1, MAX, MBD4, MEN1, MET, MLH1, MSH2, MSH3, MSH6, MUTYH, NF1, NF2, NTHL1, PALB2, PHOX2B, PMS2, POT1, PRKAR1A, PTCH1, PTEN, RAD51C, RAD51D, RB1, RET, RPS20, RUNX1, SDHA, SDHAF2, SDHB, SDHC, SDHD, SMAD4, SMARCA4, SMARCB1, SMARCE1, STK11, SUFU, TMEM127, TP53, TSC1, TSC2, VHL, and WT1 (sequencing and  deletion/duplication); AXIN2, CTNNA1, DDX41, EGFR, HOXB13, KIT, MBD4, MITF, MSH3, PDGFRA, POLD1 and POLE (sequencing only); EPCAM and GREM1 (deletion/duplication only). RNA data is routinely analyzed for use in variant interpretation for all genes.    07/02/2024 -  Chemotherapy   Patient is on Treatment Plan : BREAST  Docetaxel + Carboplatin + Trastuzumab + Pertuzumab  (TCHP) q21d        FAMILY HISTORY:  We obtained a detailed, 4-generation family history.  Significant diagnoses are listed below: Family History  Problem Relation Age of Onset   Hypertension Mother    Hypertension Brother    Brain cancer Maternal Uncle        d. 52   Diabetes Neg Hx    Breast cancer Neg Hx        Significant family history includes: Maternal uncle with brain cancer Limited paternal family history   Ms. Forlenza is unaware of previous family history of genetic testing for hereditary cancer risks.  There is no reported Ashkenazi Jewish ancestry. There is no known consanguinity  GENETIC TEST RESULTS: Genetic testing reported out on June 01, 2024 through the CancerNext-Expanded+RNAinsight cancer panel found no pathogenic mutations. The CancerNext-Expanded gene panel offered by [facility] and includes sequencing, rearrangement, and RNA analysis for the following 77 genes: AIP, ALK, APC, ATM, BAP1, BARD1, BMPR1A, BRCA1, BRCA2, BRIP1, CDC73, CDH1, CDK4, CDKN1B, CDKN2A, CEBPA, CHEK2, CTNNA1, DDX41, DICER1, ETV6, FH, FLCN, GATA2, LZTR1, MAX, MBD4, MEN1, MET, MLH1, MSH2, MSH3, MSH6, MUTYH, NF1, NF2, NTHL1, PALB2, PHOX2B, PMS2, POT1, PRKAR1A, PTCH1, PTEN, RAD51C, RAD51D, RB1, RET, RPS20, RUNX1, SDHA, SDHAF2, SDHB, SDHC, SDHD, SMAD4, SMARCA4, SMARCB1, SMARCE1, STK11, SUFU, TMEM127, TP53, TSC1, TSC2, VHL, and WT1 (sequencing and deletion/duplication); AXIN2,  CTNNA1, DDX41, EGFR, HOXB13, KIT, MBD4, MITF, MSH3, PDGFRA, POLD1 and POLE (sequencing only); EPCAM and GREM1 (deletion/duplication only). RNA data is routinely  analyzed for use in variant interpretation for all genes. The test report has been scanned into EPIC and is located under the Molecular Pathology section of the Results Review tab.  A portion of the result report is included below for reference.     We discussed with Ms. Hanf that because current genetic testing is not perfect, it is possible there may be a gene mutation in one of these genes that current testing cannot detect, but that chance is small.  We also discussed, that there could be another gene that has not yet been discovered, or that we have not yet tested, that is responsible for the cancer diagnoses in the family. It is also possible there is a hereditary cause for the cancer in the family that Ms. Lozinski did not inherit and therefore was not identified in her testing.  Therefore, it is important to remain in touch with cancer genetics in the future so that we can continue to offer Ms. Weinberg the most up to date genetic testing.   Genetic testing did identify a variant of uncertain significance (VUS) was identified in the RAD51C gene called p.Q222E (c.664C>G).  At this time, it is unknown if this variant is associated with increased cancer risk or if this is a normal finding, but most variants such as this get reclassified to being inconsequential. It should not be used to make medical management decisions. With time, we suspect the lab will determine the significance of this variant, if any. If we do learn more about it, we will try to contact Ms. Mendia to discuss it further. However, it is important to stay in touch with us  periodically and keep the address and phone number up to date.  ADDITIONAL GENETIC TESTING: We discussed with Ms. Bardwell that her genetic testing was fairly extensive.  If there are genes identified to increase cancer risk that can be analyzed in the future, we would be happy to discuss and coordinate this testing at that time.    CANCER SCREENING RECOMMENDATIONS: Ms. Baer's  test result is considered negative (normal).  This means that we have not identified a hereditary cause for her personal and family history of cancer at this time. Most cancers happen by chance and this negative test suggests that her personal and family history of cancer may fall into this category.    Possible reasons for Ms. Orosz's negative genetic test include:  1. There may be a gene mutation in one of these genes that current testing methods cannot detect but that chance is small.  2. There could be another gene that has not yet been discovered, or that we have not yet tested, that is responsible for the cancer diagnoses in the family.  3.  There may be no hereditary risk for cancer in the family. The cancers in Ms. Waldrop and/or her family may be sporadic/familial or due to other genetic and environmental factors. 4. It is also possible there is a hereditary cause for the cancer in the family that Ms. Suhr did not inherit.  Therefore, it is recommended she continue to follow the cancer management and screening guidelines provided by her oncology and primary healthcare provider. An individual's cancer risk and medical management are not determined by genetic test results alone. Overall cancer risk assessment incorporates additional factors, including personal medical history, family history, and any available  genetic information that may result in a personalized plan for cancer prevention and surveillance  RECOMMENDATIONS FOR FAMILY MEMBERS:   Since she did not inherit a identifiable mutation in a cancer predisposition gene included on this panel, her children could not have inherited a known mutation from her in one of these genes. Individuals in this family might be at some increased risk of developing cancer, over the general population risk, simply due to the family history of cancer.  We recommended women in this family have a yearly mammogram beginning at age 64, or 73 years younger than the  earliest onset of cancer, an annual clinical breast exam, and perform monthly breast self-exams. Women in this family should also have a gynecological exam as recommended by their primary provider. All family members should be referred for colonoscopy starting at age 72, or 33 years younger than the earliest onset of cancer.  FOLLOW-UP: Lastly, we discussed with Ms. Stutz that cancer genetics is a rapidly advancing field and it is possible that new genetic tests will be appropriate for her and/or her family members in the future. We encouraged her to remain in contact with cancer genetics on an annual basis so we can update her personal and family histories and let her know of advances in cancer genetics that may benefit this family.   Our contact number was provided. Ms. Detzel's questions were answered to her satisfaction, and she knows she is welcome to call us  at anytime with additional questions or concerns.   Darice Monte, MS, [facility] Licensed, Certified Genetic Counselor [email or web address]

## 2024-06-08 ENCOUNTER — Encounter: Payer: Self-pay | Admitting: *Deleted

## 2024-06-09 ENCOUNTER — Telehealth (INDEPENDENT_AMBULATORY_CARE_PROVIDER_SITE_OTHER): Payer: Self-pay | Admitting: Primary Care

## 2024-06-09 ENCOUNTER — Ambulatory Visit (INDEPENDENT_AMBULATORY_CARE_PROVIDER_SITE_OTHER): Admitting: Primary Care

## 2024-06-09 ENCOUNTER — Encounter (INDEPENDENT_AMBULATORY_CARE_PROVIDER_SITE_OTHER): Payer: Self-pay | Admitting: Primary Care

## 2024-06-09 VITALS — BP 140/83 | HR 84 | Temp 98.1°F | Resp 16 | Ht 64.0 in | Wt 197.6 lb

## 2024-06-09 DIAGNOSIS — E1169 Type 2 diabetes mellitus with other specified complication: Secondary | ICD-10-CM

## 2024-06-09 DIAGNOSIS — E1159 Type 2 diabetes mellitus with other circulatory complications: Secondary | ICD-10-CM

## 2024-06-09 NOTE — Telephone Encounter (Signed)
 Pt came in stating that she needed to give paperwork to the provider that she is expecting. Paperwork dropped of at 3pm on 06/09/24.

## 2024-06-09 NOTE — Progress Notes (Unsigned)
 " [facility]  [name], is a 59 y.o. female  [phone]  FMW:[id number]  DOB - [birth year]  Chief Complaint  Patient presents with   Transitions Of Care    Pt is switching from Dr. Vicci to [name]    Anxiety    depression       Subjective:   [name] is a 59 y.o. female here today for a follow up visit. Patient has No headache, No chest pain, No abdominal pain - No Nausea, No new weakness tingling or numbness, No Cough - shortness of breath HPI  No problems updated.  Comprehensive ROS Pertinent positive and negative noted in HPI   Allergies[1]  Past Medical History:  Diagnosis Date   Anemia    CHF (congestive heart failure) (HCC)    Diabetes mellitus    Family history of brain cancer    Heart murmur    Hypertension    Pneumonia     Medications Ordered Prior to Encounter[2] Health Maintenance  Topic Date Due   Hepatitis B Vaccine (1 of 3 - 19+ 3-dose series) Never done   Colon Cancer Screening  Never done   COVID-19 Vaccine (3 - Pfizer risk series) 09/23/2019   Complete foot exam   03/01/2023   Flu Shot  12/12/2023   Zoster (Shingles) Vaccine (2 of 2) 02/24/2024   Eye exam for diabetics  04/22/2024   Hemoglobin A1C  07/01/2024   Kidney health urinalysis for diabetes  07/02/2024   Yearly kidney function blood test for diabetes  05/26/2025   Breast Cancer Screening  06/04/2025   Pap with HPV screening  10/02/2026   DTaP/Tdap/Td vaccine (2 - Td or Tdap) 02/21/2029   Pneumococcal Vaccine for age over 32  Completed   HPV Vaccine (No Doses Required) Completed   Hepatitis C Screening  Completed   HIV Screening  Completed   Meningitis B Vaccine  Aged Out    Objective:   Vitals:   06/09/24 0934  BP: (!) 140/83  Pulse: 84  Resp: 16  Temp: 98.1 F (36.7 C)  SpO2: 99%  Weight: 197 lb 9.6 oz (89.6 kg)  Height: 5' 4 (1.626 m)   {Vitals History (Optional):23777}  Physical Exam  {Labs (Optional):23779}  Assessment & Plan   Type 2 diabetes mellitus with other specified complication, without long-term current use of insulin  (HCC) -     CBC with Differential/Platelet; Future -     POCT glycosylated hemoglobin (Hb A1C)  Hypertension associated with type 2 diabetes mellitus (HCC) -     CMP14+EGFR; Future  Hyperlipidemia associated with type 2 diabetes mellitus (HCC) -     Lipid panel; Future     Patient have been counseled extensively about nutrition and exercise. Other issues discussed during this visit include: low cholesterol diet, weight control and daily exercise, foot care, annual eye examinations at Ophthalmology, importance of adherence with medications and regular follow-up. We also discussed long term complications of uncontrolled diabetes and hypertension.   No follow-ups on file.  The patient was given clear instructions to go to ER or return to medical center if symptoms don't improve, worsen or new problems develop. The patient verbalized understanding. The patient was told to call to get lab results if they haven't heard anything in the next week.   This note has been created with Education officer, environmental. Any transcriptional errors are unintentional.   [name] SHAUNNA Bohr, NP 06/09/2024, 10:35 AM    [1] No  Known Allergies [2]  Current Outpatient Medications on File Prior to Visit  Medication Sig Dispense Refill   amLODipine  (NORVASC ) 10 MG tablet Take 1 tablet (10 mg total) by mouth daily. 90 tablet 1   olmesartan -hydrochlorothiazide  (BENICAR  HCT) 40-25 MG tablet Take 1 tablet by mouth daily. 90 tablet 1   spironolactone  (ALDACTONE ) 25 MG tablet TAKE 1 TABLET(25 MG) BY MOUTH EVERY DAY 90 tablet 0   ferrous sulfate  (FEROSUL) 325 (65 FE) MG tablet Take 1 tablet (325 mg total) by mouth 2 (two) times daily with a meal. TAKE 1 TABLET(325 MG) BY MOUTH TWICE DAILY WITH A MEAL 180 tablet 1   glucose blood test strip 1 each by Other route daily before breakfast.  Use 1 strip daily to check fasting blood glucose 100 each 12   lidocaine -prilocaine  (EMLA ) cream Apply to affected area once 30 g 3   ondansetron  (ZOFRAN ) 8 MG tablet Take 1 tablet (8 mg total) by mouth every 8 (eight) hours as needed for nausea or vomiting. Start on the third day after chemotherapy. 30 tablet 1   prednisoLONE  acetate (PRED FORTE ) 1 % ophthalmic suspension Place 1 drop into the left eye 4 (four) times daily. 10 mL 0   prochlorperazine  (COMPAZINE ) 10 MG tablet Take 1 tablet (10 mg total) by mouth every 6 (six) hours as needed for nausea or vomiting. 30 tablet 1   No current facility-administered medications on file prior to visit.   "

## 2024-06-10 ENCOUNTER — Encounter: Payer: Self-pay | Admitting: Hematology and Oncology

## 2024-06-10 LAB — LIPID PANEL
Chol/HDL Ratio: 3.4 ratio (ref 0.0–4.4)
Cholesterol, Total: 204 mg/dL — ABNORMAL HIGH (ref 100–199)
HDL: 60 mg/dL
LDL Chol Calc (NIH): 130 mg/dL — ABNORMAL HIGH (ref 0–99)
Triglycerides: 75 mg/dL (ref 0–149)
VLDL Cholesterol Cal: 14 mg/dL (ref 5–40)

## 2024-06-10 LAB — CBC WITH DIFFERENTIAL/PLATELET
Basophils Absolute: 0.1 10*3/uL (ref 0.0–0.2)
Basos: 1 %
EOS (ABSOLUTE): 0.1 10*3/uL (ref 0.0–0.4)
Eos: 1 %
Hematocrit: 42.6 % (ref 34.0–46.6)
Hemoglobin: 13.3 g/dL (ref 11.1–15.9)
Immature Grans (Abs): 0 10*3/uL (ref 0.0–0.1)
Immature Granulocytes: 0 %
Lymphocytes Absolute: 2.4 10*3/uL (ref 0.7–3.1)
Lymphs: 33 %
MCH: 24.7 pg — ABNORMAL LOW (ref 26.6–33.0)
MCHC: 31.2 g/dL — ABNORMAL LOW (ref 31.5–35.7)
MCV: 79 fL (ref 79–97)
Monocytes Absolute: 0.5 10*3/uL (ref 0.1–0.9)
Monocytes: 6 %
Neutrophils Absolute: 4.3 10*3/uL (ref 1.4–7.0)
Neutrophils: 59 %
Platelets: 289 10*3/uL (ref 150–450)
RBC: 5.38 x10E6/uL — ABNORMAL HIGH (ref 3.77–5.28)
RDW: 14.2 % (ref 11.7–15.4)
WBC: 7.3 10*3/uL (ref 3.4–10.8)

## 2024-06-10 LAB — CMP14+EGFR
ALT: 5 [IU]/L (ref 0–32)
AST: 20 [IU]/L (ref 0–40)
Albumin: 4.6 g/dL (ref 3.8–4.9)
Alkaline Phosphatase: 142 [IU]/L — ABNORMAL HIGH (ref 49–135)
BUN/Creatinine Ratio: 9 (ref 9–23)
BUN: 8 mg/dL (ref 6–24)
Bilirubin Total: 0.2 mg/dL (ref 0.0–1.2)
CO2: 24 mmol/L (ref 20–29)
Calcium: 10.3 mg/dL — ABNORMAL HIGH (ref 8.7–10.2)
Chloride: 99 mmol/L (ref 96–106)
Creatinine, Ser: 0.88 mg/dL (ref 0.57–1.00)
Globulin, Total: 3.5 g/dL (ref 1.5–4.5)
Glucose: 101 mg/dL — ABNORMAL HIGH (ref 70–99)
Potassium: 4 mmol/L (ref 3.5–5.2)
Sodium: 140 mmol/L (ref 134–144)
Total Protein: 8.1 g/dL (ref 6.0–8.5)
eGFR: 77 mL/min/{1.73_m2}

## 2024-06-10 LAB — HEMOGLOBIN A1C
Est. average glucose Bld gHb Est-mCnc: 206 mg/dL
Hgb A1c MFr Bld: 8.8 % — ABNORMAL HIGH (ref 4.8–5.6)

## 2024-06-11 ENCOUNTER — Telehealth: Payer: Self-pay

## 2024-06-11 ENCOUNTER — Ambulatory Visit (HOSPITAL_COMMUNITY)

## 2024-06-11 NOTE — Telephone Encounter (Signed)
 Spoke with pt and pt stated that she transfer her Care to [facility], and she no longer needed her FMLA forms processed.

## 2024-06-11 NOTE — Telephone Encounter (Signed)
 FMLA has been received and has been giving to provider

## 2024-06-15 NOTE — Progress Notes (Signed)
 [facility] CLINICAL SOCIAL WORK NOTE  PATIENT INFORMATION:  [name] female October 17, 1965 [id number]   REFERRAL SOURCE: Nursing (Amy Gwendel, RN)   REASON FOR REFERRAL:  Care Coordination  IDENTIFYING STATEMENT: [name] is a 59 y.o. female followed at [facility], [facility] for treatment of breast cancer.  Patient lives in [address] (57 miles from [place]) and is insured with [facility].  CLINICAL IMPRESSION/INTERVENTION: This Oncology CSW engaged with patient via telephone.  Patient presents with appropriate affect and congruent engagement. CSW conducted psychosocial screening. Patient reports that she just learned that she has disability through her employer.  This is a source of relief that she will have income while receiving treatment.  Patient requests lodging information.  She is interested in staying the night before chemotherapy since she often has to be in Lab in the early morning.  CSW reviewed [facility] as well as local hotels.  CSW sent [facility] referral for 2/10, 3/3, 3/24, and 4/14 but did provide caveat that [facility] cannot always accommodate 1 night stays.  CSW also gave patient the number to [facility] [phone]).  Assessed coping which is adequate for the situation .  CLINICAL INTERVENTION PROVIDED: Care Coordination and Advocacy  PLAN: This CSW will remain available to assist patient as needed with psychosocial needs. CSW provided contact information so patient may contact this CSW with any psychosocial needs or concerns.   Rollene Kingdom, MSW, LCSW Oncology Clinical Social Worker [phone]

## 2024-06-16 ENCOUNTER — Telehealth (INDEPENDENT_AMBULATORY_CARE_PROVIDER_SITE_OTHER): Payer: Self-pay | Admitting: Primary Care

## 2024-06-16 NOTE — Telephone Encounter (Signed)
 Will forward to provider

## 2024-06-16 NOTE — Telephone Encounter (Signed)
 Copied from CRM #[id number]. Topic: Clinical - Lab/Test Results >> Jun 16, 2024  9:46 AM Tonda B wrote: Reason for CRM:  patient is calling wanting to go over her test result have questions please call pt back [phone]

## 2024-06-17 ENCOUNTER — Ambulatory Visit (INDEPENDENT_AMBULATORY_CARE_PROVIDER_SITE_OTHER): Payer: Self-pay | Admitting: Primary Care

## 2024-06-17 ENCOUNTER — Ambulatory Visit: Payer: Self-pay | Admitting: Internal Medicine

## 2024-06-17 NOTE — Telephone Encounter (Signed)
 Oncologist has done [facility]

## 2024-07-01 ENCOUNTER — Encounter (HOSPITAL_BASED_OUTPATIENT_CLINIC_OR_DEPARTMENT_OTHER): Payer: Self-pay

## 2024-07-01 ENCOUNTER — Ambulatory Visit (HOSPITAL_BASED_OUTPATIENT_CLINIC_OR_DEPARTMENT_OTHER): Admit: 2024-07-01 | Admitting: Surgery

## 2024-07-02 ENCOUNTER — Inpatient Hospital Stay

## 2024-07-02 ENCOUNTER — Inpatient Hospital Stay: Admitting: Adult Health

## 2024-07-02 ENCOUNTER — Inpatient Hospital Stay: Attending: Hematology and Oncology

## 2024-07-05 ENCOUNTER — Inpatient Hospital Stay

## 2024-07-22 ENCOUNTER — Inpatient Hospital Stay

## 2024-07-22 ENCOUNTER — Inpatient Hospital Stay: Attending: Hematology and Oncology

## 2024-07-22 ENCOUNTER — Inpatient Hospital Stay: Admitting: Adult Health

## 2024-07-24 ENCOUNTER — Inpatient Hospital Stay
